# Patient Record
Sex: Male | Born: 1940 | Race: White | Hispanic: No | Marital: Married | Smoking: Former smoker
Health system: Southern US, Community
[De-identification: ages and names within clinical notes are randomized; demographics above are authoritative.]

## PROBLEM LIST (undated history)

## (undated) DIAGNOSIS — I739 Peripheral vascular disease, unspecified: Secondary | ICD-10-CM

## (undated) DIAGNOSIS — R0902 Hypoxemia: Secondary | ICD-10-CM

## (undated) DIAGNOSIS — I2510 Atherosclerotic heart disease of native coronary artery without angina pectoris: Secondary | ICD-10-CM

## (undated) DIAGNOSIS — I10 Essential (primary) hypertension: Secondary | ICD-10-CM

## (undated) DIAGNOSIS — I509 Heart failure, unspecified: Secondary | ICD-10-CM

## (undated) DIAGNOSIS — E785 Hyperlipidemia, unspecified: Secondary | ICD-10-CM

## (undated) DIAGNOSIS — J449 Chronic obstructive pulmonary disease, unspecified: Secondary | ICD-10-CM

## (undated) DIAGNOSIS — Z720 Tobacco use: Secondary | ICD-10-CM

## (undated) DIAGNOSIS — F1020 Alcohol dependence, uncomplicated: Secondary | ICD-10-CM

## (undated) HISTORY — DX: Hypoxemia: R09.02

## (undated) HISTORY — DX: Peripheral vascular disease, unspecified: I73.9

## (undated) HISTORY — DX: Heart failure, unspecified: I50.9

## (undated) HISTORY — DX: Hyperlipidemia, unspecified: E78.5

## (undated) HISTORY — DX: Atherosclerotic heart disease of native coronary artery without angina pectoris: I25.10

## (undated) HISTORY — DX: Tobacco use: Z72.0

## (undated) HISTORY — DX: Essential (primary) hypertension: I10

## (undated) HISTORY — PX: ANGIOPLASTY: SHX39

---

## 1999-11-11 ENCOUNTER — Observation Stay (HOSPITAL_COMMUNITY): Admission: RE | Admit: 1999-11-11 | Discharge: 1999-11-12 | Payer: Self-pay | Admitting: Cardiovascular Disease

## 2000-06-10 DIAGNOSIS — I2510 Atherosclerotic heart disease of native coronary artery without angina pectoris: Secondary | ICD-10-CM

## 2000-06-10 HISTORY — DX: Atherosclerotic heart disease of native coronary artery without angina pectoris: I25.10

## 2000-06-26 ENCOUNTER — Inpatient Hospital Stay (HOSPITAL_COMMUNITY): Admission: AD | Admit: 2000-06-26 | Discharge: 2000-06-28 | Payer: Self-pay | Admitting: Cardiovascular Disease

## 2000-06-26 ENCOUNTER — Encounter: Payer: Self-pay | Admitting: Cardiovascular Disease

## 2000-06-26 HISTORY — PX: CORONARY ANGIOPLASTY WITH STENT PLACEMENT: SHX49

## 2002-06-04 ENCOUNTER — Encounter: Payer: Self-pay | Admitting: Cardiovascular Disease

## 2002-06-04 ENCOUNTER — Ambulatory Visit (HOSPITAL_COMMUNITY): Admission: RE | Admit: 2002-06-04 | Discharge: 2002-06-05 | Payer: Self-pay | Admitting: Cardiovascular Disease

## 2002-07-02 ENCOUNTER — Ambulatory Visit (HOSPITAL_COMMUNITY): Admission: RE | Admit: 2002-07-02 | Discharge: 2002-07-03 | Payer: Self-pay | Admitting: Cardiovascular Disease

## 2004-06-01 ENCOUNTER — Ambulatory Visit (HOSPITAL_COMMUNITY): Admission: RE | Admit: 2004-06-01 | Discharge: 2004-06-01 | Payer: Self-pay | Admitting: Cardiovascular Disease

## 2004-07-08 ENCOUNTER — Encounter: Admission: RE | Admit: 2004-07-08 | Discharge: 2004-07-08 | Payer: Self-pay | Admitting: Neurology

## 2004-07-15 ENCOUNTER — Ambulatory Visit (HOSPITAL_COMMUNITY): Admission: RE | Admit: 2004-07-15 | Discharge: 2004-07-16 | Payer: Self-pay | Admitting: Cardiovascular Disease

## 2004-07-15 HISTORY — PX: SUBCLAVIAN ANGIOGRAM: SHX5350

## 2005-09-22 ENCOUNTER — Ambulatory Visit (HOSPITAL_COMMUNITY): Admission: RE | Admit: 2005-09-22 | Discharge: 2005-09-23 | Payer: Self-pay | Admitting: Cardiovascular Disease

## 2005-09-22 HISTORY — PX: RENAL ANGIOGRAM: SHX6061

## 2006-09-02 ENCOUNTER — Inpatient Hospital Stay (HOSPITAL_COMMUNITY): Admission: EM | Admit: 2006-09-02 | Discharge: 2006-09-04 | Payer: Self-pay | Admitting: Emergency Medicine

## 2006-09-02 ENCOUNTER — Ambulatory Visit: Payer: Self-pay | Admitting: Internal Medicine

## 2009-05-04 ENCOUNTER — Ambulatory Visit (HOSPITAL_COMMUNITY): Admission: RE | Admit: 2009-05-04 | Discharge: 2009-05-04 | Payer: Self-pay | Admitting: Cardiovascular Disease

## 2009-07-11 HISTORY — PX: NM MYOCAR PERF WALL MOTION: HXRAD629

## 2009-07-23 ENCOUNTER — Ambulatory Visit (HOSPITAL_COMMUNITY): Admission: RE | Admit: 2009-07-23 | Discharge: 2009-07-23 | Payer: Self-pay | Admitting: Cardiovascular Disease

## 2010-07-26 ENCOUNTER — Ambulatory Visit (HOSPITAL_COMMUNITY)
Admission: RE | Admit: 2010-07-26 | Discharge: 2010-07-26 | Payer: Self-pay | Source: Home / Self Care | Attending: Cardiovascular Disease | Admitting: Cardiovascular Disease

## 2010-11-26 NOTE — Discharge Summary (Signed)
   NAME:  Kevin Watkins, Kevin Watkins                             ACCOUNT NO.:  [id number]   MEDICAL RECORD NO.:  [id number]                   PATIENT TYPE:  OIB   LOCATION:  5523                                 FACILITY:  [facility]   PHYSICIAN:  Nanetta Batty, M.D.                DATE OF BIRTH:  Aug 31, 1940   DATE OF ADMISSION:  07/02/2002  DATE OF DISCHARGE:  07/03/2002                                 DISCHARGE SUMMARY   DISCHARGE DIAGNOSES:  1. Peripheral vascular disease, status post right innominate artery stenting     by Nanetta Batty, M.D.  2. History of coronary disease, status post right coronary artery stenting,     December 2001, in the setting of a DMI.  3. Peripheral vascular disease with previous left iliac artery stenting and     known right renal artery stenosis.  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  4. Treated hyperlipidemia.  5. History of smoking.   HOSPITAL COURSE:  The patient is a 70 year old male with coronary disease  and widespread peripheral vascular disease, was admitted for elective  angiogram.  This was done 07/02/02 by Nanetta Batty, M.D.  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  Allyson Sabal with good final results.  We feel he can be discharged 07/03/02.  He  will follow up with Dr. Allyson Sabal.   DISCHARGE MEDICATIONS:  1. Plavix 75 mg daily.  2. Aspirin 81 mg daily.  3. Metoprolol 50 mg b.i.d.  4. Altace 5 mg daily.  5. Lipitor 20 mg daily.  6. Folic acid daily.   LABORATORY DATA:  Not available at the time of dictation.   DISPOSITION:  The patient was discharged in stable condition and will follow  up with Nanetta Batty, M.D.  He needs upper extremity Dopplers and carotid  Dopplers after discharge.     Abelino Derrick, P.A.                      Nanetta Batty, M.D.    Lenard Lance  D:  10/03/2002  T:  10/04/2002  Job:  [id number]

## 2010-11-26 NOTE — Cardiovascular Report (Signed)
[facility]  Patient:    Kevin Watkins, Kevin Watkins                          MRN: [id number] Adm. Date:  [id number] Attending:  Berry, Jonathan [place] Dictator:   [phone] CC:         [facility]             [facility]             Roney Marion, M.D., [facility]                        Cardiac Catheterization  PROCEDURE:  Peripheral angiogram/PTA and stent.  HISTORY OF PRESENT ILLNESS:  Kevin Watkins is a 70 year old married white male, father of one child who works for the [employer] company. He is referred for asymmetric upper extremity blood pressures with an auscultated bruit suggesting the possibility of subclavian disease. His risk factors include greater than 100 pack year history of tobacco, hypertension. He has had a negative cardiac stress test, normal left ventricular function. I performed angiography on him at [facility] revealing a high grade left common iliac which I stented. I also discovered a high-grade innominate and subclavian stenosis. The patient presents here for endovascular treatment of these lesions.  DESCRIPTION OF PROCEDURE:  The patient was brought to the second floor [facility] in the post absorptive state. He was premedicated with p.o. Valium. His right groin was prepped and shaved in the usual sterile fashion. One percent Xylocaine was used for local anesthesia. An 8 French short sheath was inserted into the right femoral artery using standard Seldinger technique. A 6 French pigtail catheter was used for aortic arch angiography. A 6 French right Judkins was used for selective innominate and left subclavian angiography. Visipaque dye was used for the entirety of the case. Retrograde aortic pressure was monitored during the case.  ANGIOGRAPHIC RESULTS: 1. Arch angiogram. All accesses were intact. There appeared to be    a high grade right innominate and subclavian  lesion with mild    narrowing of the proximal left femoral and carotid. 2. Selective left subclavian; This revealed mild narrowing in the    20% range of the proximal left subclavian artery. There was a    70% ostial left vertebral artery stenosis. 3. Selective right innominate angiography. A complex proximal 90%    right innominate stenosis and 60% proximal right subclavian    stenosis.  DESCRIPTION OF PROCEDURE: Using a 6 French right Judkins catheter and an O35 angled Glidewire, the lesion was crossed and the wire was past into the distal right subclavian artery. A right catheter was then advanced over the Glidewire, across the right innominate lesion, into the right subclavian and a long Amplatz Super Stiff guidewire was then used to exchange. An 8 French 90 cm long Cordis sheath was then advanced over the the Amplatz Super Stiff guidewire into the proximal right innominate. The patient received 3000 units of heparin intravenously. Dr. Sharyn Blitz assisted during the case.  Predilatation was performed with a 5 x 2 Powerflex abdominal pressures. Following this, a ____ mounted on an 8 x 2 Powerflex was then deployed and post dilated with a 9 x 2 Powerflex at 6 atmospheres for 30 seconds resulting in reduction of a 90% proximal right innominate stenosis to 0% residual. There was a 10 to 15  mm gradient noted across the right subclavian artery stenosis. It had been decided to proceed with stenting this; however, the guide wire slipped back into the innominate and was unable to be advanced and because of this the procedure was terminated.  OVERALL IMPRESSION:  Successful right innominate PTA and stenting for relief of symptoms of right upper extremity claudication. I believe this is the most hemodynamically significant lesion and that he will enjoy considerable clinical benefits. He will also be treated with aspirin and Plavix. The sheaths removed after an ACT measured and pressure was  held on the groin to achieve hemostasis. The patient left the lab in stable condition. He will be discharged home in the morning with follow-up upper extremity Dopplers and return office visit subsequent to that. Dr. Lawana Pai was notified of these results. DD:  11/11/99 TD:  11/11/99 Job: [id number]

## 2010-11-26 NOTE — Discharge Summary (Signed)
[facility]  Patient:    OTHMAN, MASUR                          MRN: [id number] Adm. Date:  [id number] Disc. Date: [id number] Attending:  Berry, Jonathan [place] Dictator:   Halford Decamp. Delanna Ahmadi, R.N., N.P. CC:         Dr. Fayrene Fearing _________   Discharge Summary  HISTORY OF PRESENT ILLNESS/HOSPITAL COURSE:  Mr. Melbert Botelho was transferred from [facility] with chest pain acutely.  He had ST elevation in III and aVF. He also had PVCs and a short run of ventricular tachycardia treated with lidocaine.  He was transferred here for cardiac catheterization.  He was seen by Gerlene Burdock A. Alanda Amass, M.D., on arrival.  He had been started on aspirin, IV heparin and IV Aggrastat and given nitroglycerin at [facility].  His ST elevation was resolved.  EKG here showed no acute pattern; however, he was taken to the catheterization lab which revealed a 60% RCA proximal and a 60 to 70% distal.  He underwent PTCA and stenting of the distal lesion, given Aggrastat for 24 hours.  Post procedure he did well without any further complications of chest pain.  His cardiac enzymes were elevated.  He was seen by cardiac rehab and education was given to him and recommendations for cardiac rehab phase II program were given.  His medications were changed.  LABS:  TSH 1.16.  Total cholesterol 179, triglycerides 152, HDL 35, and LDL of 114, total cholesterol/HDL ratio 5.1. Hemoglobin 14, hematocrit 40.4, platelets 195, MCV 94.2. Sodium 130, potassium 3.8, BUN 7, creatinine 0.8, magnesium 1.5.  CK-MB #1 was 223/33.2 with troponin I of 5.40, #2 was 213/24.5 with troponin I of 4.7, #3 was 123/11.6.  BMET on June 27, 2000, showed sodium 130, potassium 3.8, glucose 105, BUN 7, creatinine 0.8.  A chest x-ray showed no active lung disease.  EKG showed normal sinus rhythm with T-wave inversions in lead III.  DISCHARGE MEDICATIONS:  1. Enteric coated aspirin 325 mg once a day.  2. Plavix 75 mg  once per day x 4 weeks.  3. Protonix 40 mg once per day x 4 weeks.  4. Lopressor 25 mg twice per day.  5. Altace 2.5 mg once per day.  6. Wellbutrin SR 150 mg twice per day.  7. Nitroglycerin 1/150 under tongue every five minutes x 3 for chest pain     when needed.  8. Lipitor 10 mg once per day.  9. Folic acid 1 mg once per day. 10. Vitamin B50  complex one everyday. 11. Vitamin E 400 i.u. once per day.  ACTIVITY:  He should do no strenuous activity, no driving until this weekend and then only short distance.  He should play no golf.  Do not go back to work until he is seen by Runell Gess, M.D., as an outpatient and he is released.  DISCHARGE INSTRUCTIONS:  If he has any problems with his groin he is to give our office a call.  We have recommended that he quit smoking and that he attend cardiac rehab phase II and we are recommending that he have a BMET drawn next Wednesday after initiation of Altace.  FOLLOW-UP:  He will follow up with Dr. Allyson Sabal on July 19, 2000, at 3:10.  DISCHARGE DIAGNOSES: 1. Acute inferior myocardial infarction. 2. Status post emergent intervention with 60 to 70% distal right coronary    artery,  60% proximal right coronary artery and a 50 to 60% nondominant    circumflex mid lesion with subsequent percutaneous transluminal coronary    angioplasty and stenting of his distal right coronary artery. 3. Arteriosclerotic peripheral vascular disease.  Per catheterization this    admission, he has a 60 to 70% stenosis of his innominate artery    stent. 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  He has a 60%    right renal artery stenosis.  He has a prior medical history of left    common iliac artery percutaneous transluminal angioplasty and stenting on    October 21, 1999, and Nov 11, 1999, he had a right innominate percutaneous    transluminal angioplasty and stenting. 4. Hyperlipidemia. 5. Hypertension. 6. Continued tobacco use. 7. Chronic  obstructive pulmonary disease. 8. Anxiety. DD:  06/28/00 TD:  06/29/00 Job: [id number]

## 2010-11-26 NOTE — H&P (Signed)
NAME:  Kevin Watkins, Kevin Watkins                             ACCOUNT NO.:  [id number]   MEDICAL RECORD NO.:  [id number]                   PATIENT TYPE:  OIB   LOCATION:  5523                                 FACILITY:  [facility]   PHYSICIAN:  Nanetta Batty, M.D.                DATE OF BIRTH:  [birth year]   DATE OF ADMISSION:  07/02/2002  DATE OF DISCHARGE:  07/03/2002                                HISTORY & PHYSICAL   CHIEF COMPLAINT:  Arm weakness.   HISTORY OF PRESENT ILLNESS:  The patient is a 70 year old male followed by  Dr. Allyson Sabal with history of coronary disease and peripheral vascular disease.  Please see attached office notes for complete details.  He has right upper  extremity claudication and is admitted for elective angiogram.  On recent  angiogram while doing Allyson Sabal was doing his right common iliac, it was noted  that he had a 99% in-stent restenosis of his right innominate artery.   PAST MEDICAL HISTORY:  This is remarkable for hyperlipidemia and coronary  disease with previous RCA stenting and DMI.  He has had previous iliac  artery stenting also.   CURRENT MEDICATIONS:  1. Plavix daily.  2. Aspirin 81 mg a day.  3. Metoprolol 50 mg twice a day.  4. Altace 5 mg a day.  5. Lipitor 20 mg a day.  6. Folic acid daily.   ALLERGIES:  No known drug allergies.   SOCIAL HISTORY:  He is married.  He is a smoker but has quit.   FAMILY HISTORY:  Unremarkable.   Please refer to old records for complete details.   REVIEW OF SYSTEMS:  Essentially unremarkable except for as noted above. He  does wear glasses.   PHYSICAL EXAMINATION:  GENERAL APPEARANCE:  He is a well-developed, well-  nourished male in no acute distress.  VITAL SIGNS:  Blood pressure 120/66, pulse 68, temperature 97.2.  HEENT:  Normocephalic.  Extraocular movements intact.  Sclerae is  nonicteric.  NECK:  Carotid bruits.  No JVD.  CHEST:  Clear to auscultation and percussion.  CARDIOVASCULAR:  Regular rate and rhythm  without obvious murmurs, rubs, or  gallops.  Normal S1 and S2.  ABDOMEN:  Nontender, nondistended.  EXTREMITIES:  Diminished distal pulses with normal artery bruits.  NEUROLOGIC:  Grossly intact.  He is awake, alert and oriented.  He is  cooperative and moves all extremities without obvious deficit.    IMPRESSION:  1. 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  2. Previous peripheral interventions as described.  3. Coronary disease with previous right coronary artery stenting in the     setting of diaphragmatic myocardial infarction.  4. Treated hyperlipidemia.  5. History of smoking.   PLAN:  The patient is admitted for peripheral angiogram and  further  intervention by Dr. Allyson Sabal.     Abelino Derrick, P.A.                      Nanetta Batty, M.D.    Kevin Watkins  D:  10/03/2002  T:  10/04/2002  Job:  [id number]

## 2010-11-26 NOTE — Discharge Summary (Signed)
   NAME:  Kevin Watkins, Kevin Watkins                             ACCOUNT NO.:  [id number]   MEDICAL RECORD NO.:  [id number]                   PATIENT TYPE:  OIB   LOCATION:  4736                                 FACILITY:  [facility]   PHYSICIAN:  Nanetta Batty, M.D.                DATE OF BIRTH:  Jul 31, 1940   DATE OF ADMISSION:  06/04/2002  DATE OF DISCHARGE:  06/05/2002                                 DISCHARGE SUMMARY   DISCHARGE DIAGNOSES:  1. Peripheral vascular disease, status post right iliac PTA this admission.  2. 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  3. History of smoking.  4. History of hyperlipidemia.  5. History of coronary disease with previous right coronary artery PCI and     stenting 12/01.   HOSPITAL COURSE:  Mr. Wien is a 70 year old male followed by Dr. Allyson Sabal with a  history of coronary disease and peripheral disease. Dr. Allyson Sabal did a RCA PCI  and stenting 06/26/00. He had normal LV function. He had left iliac stenting  in the past and a previous high grade innominate stenosis that was stented  11/11/99. He has some dizziness seen by Dr. Allyson Sabal 05/31/02. He is admitted for  angiogram as Dr. Allyson Sabal was concerned he might have subclavian steel. The  patient was admitted for peripheral angiogram. This was done 06/04/02. He  had a 60% right renal artery stenosis and 95% right iliac stenosis and a 99%  in-stent restenosis of the right innominate artery. He underwent PTA of the  right iliac and is to be set up for a staged intervention of the right  innominate. He is discharged 06/05/02.   DISCHARGE MEDICATIONS:  1. Plavix 75 mg a day.  2. Baby aspirin q.d.  3. Metoprolol 25 mg b.i.d.  4. Lipitor 20 mg q.d.  5. Altace 5 mg q.d.  6. Nicotine patch.   LABORATORY DATA:  White count 7.3, hemoglobin 14.4, hematocrit 41.3,  platelets 149. Sodium 133, potassium 4.0, BUN 7, creatinine 0.9.   DISPOSITION:  The patient  is discharged in stable condition and will follow  up with Dr. Allyson Sabal on 12/5 at 4:25 p.m. He will need to be set up for a  staged intervention of the right innominate artery at that time.     Abelino Derrick, P.A.                      Nanetta Batty, M.D.    Lenard Lance  D:  06/05/2002  T:  06/06/2002  Job:  [id number]

## 2010-11-26 NOTE — Cardiovascular Report (Signed)
NAME:  Kevin Watkins, COOPRIDER                   ACCOUNT NO.:  [id number]   MEDICAL RECORD NO.:  [id number]          PATIENT TYPE:  OIB   LOCATION:  2899                         FACILITY:  [facility]   PHYSICIAN:  Nanetta Batty, M.D.   DATE OF BIRTH:  03-25-1941   DATE OF PROCEDURE:  06/01/2004  DATE OF DISCHARGE:                              CARDIAC CATHETERIZATION   INDICATIONS:  Mr. Mccambridge is a 70 year old moderately overweight white male with  history of coronary artery disease and PVOD.  He has had RCA PCI and  stenting December 2001 with noncritical LAD and circumflex disease.  He has  had innominate PTA and stenting and 2001 with restenting in December 2003.  He has known 50% renal artery stenosis and he has had right common iliac  artery PTA and stenting.  Continues to smoke and has history of hypertension  and hyperlipidemia.  He has complained of posterior circulation symptoms and  his Dopplers suggest restenosis of his innominate.  Presents now for  angiography and potential intervention.   PROCEDURE DESCRIPTION:  The patient was brought to the sixth floor [facility] in the postabsorptive state.  He  was premedicated with p.o. Valium and IV Versed.  His right groin was  prepped and shaved in the usual sterile fashion.  1% Xylocaine was used for  local anesthesia.  A 5 French sheath was inserted into the right femoral  artery using standard Seldinger technique. A 5 [place] tennis racquet  catheter, long right Judkins, JB1, Bernstein 2 and VTach  catheters were  used for arch angiography, selective right innominate, left common carotid,  left vertebral, distal abdominal and right renal angiography.  Visipaque dye  was used for the entirety of the case.  Retrograde aortic pressure was  monitored during the case.   ANGIOGRAPHIC RESULTS:  1.  Arch aortogram.      1.  Right innominate totally occluded with faint filling distal to the          stent and  obvious retrograde fill of the vertebral artery.      2.  Left common carotid; 90% aortoostial with 80 mm gradient.      3.  Left subclavian; widely patent with 80% left vertebral and          retrograde fill of the right vertebral with delayed imaging.  2.  Distal aortogram.      1.  60% right renal artery stenosis with 40 mm gradient.      2.  Widely patent right common iliac artery stent.   PROCEDURE DESCRIPTION:  The patient received 2500 units of heparin  intravenously.  Attempts were made to cross the right innominate stent using  Wholey and glide wire unsuccessfully.  The procedure was aborted.  The ACT  was measured less than 200 and the sheath was removed.  Pressure was held on  the groin to achieve hemostasis.  The patient left the lab in stable  condition.  Plans will be to keep him recumbent for six hours and hydrate  him.  He  will be discharged home today as an outpatient and we will see him  back in the office in one week.  We will plan on bringing him to approach  his right innominate through the right brachial approach.  He also has a  high grade right common carotid artery which suspect will require  percutaneous revascularization.  I have discussed this with Dr.  Madilyn Fireman who agrees that the patient should be referred to Dr. Pearlean Brownie for  neurologic evaluation and then to him for surgical evaluation.  He is a not  a surgical candidate because of the anatomic location and he is symptomatic.  The patient left the lab in stable condition.       JB/MEDQ  D:  06/01/2004  T:  06/01/2004  Job:  [id number]   cc:   Dr. Roney Marion

## 2010-11-26 NOTE — Cardiovascular Report (Signed)
NAME:  Kevin Watkins, Kevin Watkins                   ACCOUNT NO.:  [id number]   MEDICAL RECORD NO.:  [id number]          PATIENT TYPE:  OIB   LOCATION:  2807                         FACILITY:  [facility]   PHYSICIAN:  Nanetta Batty, M.D.   DATE OF BIRTH:  January 05, 1941   DATE OF PROCEDURE:  09/22/2005  DATE OF DISCHARGE:                              CARDIAC CATHETERIZATION   PROCEDURE:  Arch aortogram, abdominal aortogram, selective right innominate  subclavian, left common carotid, and left subclavian as well as right renal  and left renal artery angiogram, PT and stent of right renal artery.   HISTORY:  Mr. Kevin Watkins is a 70 year old gentleman with a history of CAD and PVOD.  He has a history of continued tobacco use, hypertension, and hyperlipidemia.  He has had a right innominate and right subclavian artery PT and stenting in  the past via the right brachial approach in January 2006.  He has known left  common carotid artery ostial disease which we have been following  clinically.  He is being seen by Dr. Delia Heady for this.  He had a 60%  right renal artery stenosis which we had been following by Duplex ultrasound  and recently his velocities have accelerated.  He does have hypertension,  poorly controlled.  He also has a history of CAD status post cath December  2001 revealing a patent RCA stent with a non-critical circumflex disease.  He has had a low risk Cardiolite July 21, 2005, and denies chest pain or  shortness of breath.  His creatinine was normal.  He presents now for  angiography and potential intervention.   DESCRIPTION OF PROCEDURE:  The patient was brought to the first floor  [facility]  [facility] in the postabsorptive state.  He was premedicated with p.o.  Valium.  His right groin was prepped and shaved in the usual sterile  fashion.  1% Xylocaine was used for local anesthesia.  A 6 French sheath was  inserted into the left  femoral artery using standard Seldinger technique.  A  6 French pigtail catheter, long right Judkins catheter, H1 catheter, CB1  catheter, and short IMA catheters were used for distal abdominal  aortography, selective right innominate, subclavian, left common carotid,  left subclavian, and bilateral renal artery angiography.  Visipaque dye was  used for the entirety of the case.  Retrograde aortic pressures were  monitored during the case.   ANGIOGRAPHIC RESULTS:  1.  Arch aortogram.      1.  Arch vessels were intact.  The right innominate had a patent stent.      2.  The right subclavian artery stent was 100% occluded.  The right          subclavian filled by retrograde filling via ipsilateral collaterals          from the right vertebral.      3.  Left common carotid artery 90% ostial stenosis.      4.  Left subclavian artery widely patent with a 75% ostial left  vertebral artery stenosis.  2.  Abdominal aortogram.      1.  90% proximal right renal artery stenosis.      2.  Normal left renal artery.      3.  Normal infrarenal abdominal aorta.   PROCEDURE DESCRIPTION:  The patient received 2000 units of heparin  intravenously.  Using a 6 French short IMA guide catheter along with a 190  stabilizer wire and a 4 by 2 Aviator, PTA was performed of the right renal  artery.  Following this, a 5 by 12 Genesis on Aviator stent balloon pre-  mount was then deployed at 8 atmospheres resulting in reduction of a  proximal 90% right renal artery stenosis to 0% residual.  There was post  stenotic dilatation versus small aneurysmal dilatation which the stent  stopped  just short of.   IMPRESSION:  Successful right renal artery PTA and stenting for hypertension  and renal preservation.  The patient does have an occluded right subclavian  artery stent which we will treat medically at this time.  We will have Dr.  Madilyn Fireman review the arch aortogram and selective left common carotid angiogram   to decide whether or not this should be approached percutaneously.   The ACT was measured and the sheath was removed, pressure was held on the  groin to achieve hemostasis.  The patient left the lab in stable condition.  He will be hydrated overnight and discharged home in the morning.  We will  get follow up renal Doppler studies after which I will see him back in the  office for follow up.      Nanetta Batty, M.D.  Electronically Signed     JB/MEDQ  D:  09/22/2005  T:  09/23/2005  Job:  [id number]   cc:   Roney Marion, M.D.

## 2010-11-26 NOTE — Cardiovascular Report (Signed)
NAME:  Kevin Watkins, Kevin Watkins                             ACCOUNT NO.:  [id number]   MEDICAL RECORD NO.:  [id number]                   PATIENT TYPE:  OIB   LOCATION:  2889                                 FACILITY:  [facility]   PHYSICIAN:  Nanetta Batty, M.D.                DATE OF BIRTH:  07-16-1940   DATE OF PROCEDURE:  DATE OF DISCHARGE:                              CARDIAC CATHETERIZATION   PROCEDURE:  Percutaneous transluminal coronary angioplasty and stent.   INDICATIONS:  The patient is a 70 year old white male with a history of CAD  and PVOD. He has had RCA, PCI and stenting on June 26, 2000, in the  setting  of an aborted inferior wall myocardial infarction. He has had left  iliac stenting in the past, 50% right renal artery stenosis. 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 He does complain of right lower extremity  claudication as well as right upper extremity claudication. His other  problems include hypertension, hyperlipidemia and continued tobacco abuse.  Upper extremity Dopplers performed on May 28, 2000, suggested low  frequency signal  in the right common carotid and right subclavian,  monophasic wave forms, and a 50 mm gradient across his arms. He presents now  for angiography and potential intervention.   DESCRIPTION OF PROCEDURE:  The patient was brought  to the sixth floor [facility] peripheral vascular angiographic suite in a postabsorptive  state. He was premedicated with p.o. Valium. Both groins were prepped and  shaved in the usual sterile fashion. Then 1% Xylocaine was used for local  anesthesia.   A 5 French sheath was inserted into the left femoral artery using standard  Seldinger technique. A 5 French long pigtail catheter was used for RT  angiography and abdominal aortography. A 5 French JB1 catheter was used for  selective right innominate angiography. Omnipaque  was used for the entirety  of the case. The aortic pressure was monitored during the case.   ANGIOGRAPHIC RESULTS:  1. 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  2. Right subclavian:  60% proximal stenosis. The right vertebral with a good     ostium.  3. Left common carotid:  60% to 70% ostial stenosis.  4. Left subclavian artery:  Widely patent.  5. Abdominal aorta.     A. Renal arteries, 60% right renal artery stenosis.     B. Infrarenal abdominal aorta was OK.  6. Left lower extremities.     A. Left common iliac artery stent was widely patent.  7. Right lower extremity.     A. 95% ostial/proximal right common iliac artery stenosis.   DESCRIPTION OF PROCEDURE:  The patient received 3000 units of heparin  intravenously. Access to the right common femoral artery was obtained with a  smart needle and a 5 Wholey  wire. A 7 French 30 cm long bright tipped Cordis  sheath was then inserted in the right common femoral artery and advanced to  the right common  iliac artery. A PTA was performed with a 5 x 4 power flex,  resulting in a suboptimal angiographic result.   Following this  a 7 x 29 mm Genesis _________ balloon stent premount  combination was then deployed under a precise angiographic control at 12  atmospheres for 60 seconds. The final result was reduction of the 95%  proximal right common iliac artery stenosis to 0% residual. The patient  tolerated the procedure well.   The indices measured and both sheaths were removed. Pressure was held on  each groin to achieve hemostasis. The patient left the laboratory in stable  condition.  Plans will be to hydrate the patient overnight and discharge to  home in the morning. He will be seen back in the  office in one to two  weeks. At that time we will schedule his right innominate PPA. He left the  laboratory in stable condition. Dr. Roney Marion was notified of these  results.                                               Nanetta Batty, M.D.    JB/MEDQ  D:  06/04/2002  T:  06/05/2002  Job:  [id number]   cc:   [facility]   Roney Marion, M.D.  [facility]  [place], [place]

## 2010-11-26 NOTE — Cardiovascular Report (Signed)
NAME:  Kevin Watkins, Kevin Watkins                   ACCOUNT NO.:  [id number]   MEDICAL RECORD NO.:  [id number]          PATIENT TYPE:  OIB   LOCATION:  2853                         FACILITY:  [facility]   PHYSICIAN:  Nanetta Batty, M.D.   DATE OF BIRTH:  06/11/1941   DATE OF PROCEDURE:  07/15/2004  DATE OF DISCHARGE:                              CARDIAC CATHETERIZATION   INDICATIONS FOR PROCEDURE:  The patient is a 70 year old white male with  history of CAD and PVOD. He has had RCA PCI and stenting in December of  2001, noncritical LAD and circumflex disease with normal LV function.  He  has had innominate stenting in May of 2001 with restenting in December of  2003.  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  He has had bilateral  iliac artery PTA and stenting with a known 50% renal artery stenosis which  was recently assessed with a 40 mm gradient by catheterization.  His other  problems include hypertension and hyperlipidemia.  He does continue to  smoke.  He has had postcirculation symptoms.  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 He  was seen by Pramod P. Pearlean Brownie, M.D. for neurologic evaluation who agreed with  attempts at percutaneous revascularization of his right innominate after  failed attempt to access this in a retrograde fashion.  He presents now for  attempted intervention via the right brachial approach.   DESCRIPTION OF PROCEDURE:  The patient was brought to the sixth floor [facility] Peripheral Vascular Angiographic Suite in the postabsorptive  state.  He was premedicated with p.o. Valium, IV Versed, and Nubain. His  right antecubital fossa was prepped and shaved in the usual sterile fashion.  1% Xylocaine was used for local anesthesia.  A 6 French sheath was inserted  into the right brachial artery using a Doppler-tip needle and an 0.35  Wooley  wire.  Visipaque dye was used for the entirety of the case.  Retrograde  aortic pressures were monitored during the case.  The patient received 5000  units of heparin via the sidearm sheath intra-arterially as well as 200 mcg  of intra-arterial nitroglycerin to prevent vasospasm.  Retrograde pressure  was monitored.   A short 5 French right Judkins catheter was advanced over the [place] wire to  the innominate artery and attempts were made to cross the lesion with the  [facility] wire unsuccessfully.  Following this, the right Judkins was exchanged  for a 5 French short end-hole catheter and the [facility] for an 0.35 angled  Glidewire which then after several minutes of angulation manipulation, was  able to pass across the stenosis into the ascending aorta.  The catheter was  then advanced across the lesion and an 0.35 long Rosen wire was then passed  into the descending aorta across the arch.  PTA was performed using a 5.2  Powerflex upgrading to a 6.2 Powerflex under fluoroscopic control across the  occluded innominate stent.  Following this, a  30 cm long multipurpose Bright  Tip Cordis 30 cm long sheath was then exchanged and advanced to the  innominate artery.  An 8 x 24 Genesis on Opta premount was then deployed at  10 and 12 atmospheres resulting in reduction of a total occlusion to 0%  residual with excellent flow.  The sheath was then withdrawn across the  subclavian documenting a 30 mm gradient.  In addition, angiography revealed  a subtotally occluded right vertebral with a string sign.  A 7 x 18  Genesis on Opta premount was then deployed across the proximal subclavian,  reducing a 70% lesion to 0% residual with excellent flow. The patient  tolerated the procedure well.  An ACT was measured and the sheath was  removed.  Pressure was held in the antecubital fossa to achieve hemostasis.  The patient left the lab in stable condition.   PLAN:  Hydrate him.  Treat him with aspirin  and Plavix.  Discharge him home  in the morning.  He will get follow-up upper extremity Dopplers.  I will  then talk to Dr. Pearlean Brownie regarding PTA and stenting of the origin of his left  common carotid artery plus or minus his left vertebral artery.      JB/MEDQ  D:  07/15/2004  T:  07/15/2004  Job:  [id number]   cc:   [facility]  [address] [place] [address]   Fidela Salisbury, M.D.

## 2010-11-26 NOTE — Cardiovascular Report (Signed)
[place]  Patient:    Kevin Watkins, Kevin Watkins                          MRN: [id number] Proc. Date: 06/26/00 Adm. Date:  [id number] Attending:  Berry, Jonathan Watkins CC:         Cardiac Catheterization Laboratory  Kevin Watkins, M.D., [facility], [place]  [facility], [place] [address] [place], [place] [address]   Cardiac Catheterization  INDICATIONS:  Kevin Watkins is a 70 year old married, white male, patient of Kevin Watkins and Kevin Watkins.  He is status post right innominate and left common iliac artery PTCA and stenting.  His risks factors include greater than 100-pack-year history of tobacco abuse and hypertension.  He had a Cardiolite performed in March of this year showing relatively normal perfusion with some inferobasilar thinning, felt not to be significant.  He developed chest pain last night and into this morning, at [facility] where he had ST segment elevation and was treated with nitroglycerin which caused resolution of his ECG changes.  He was treated with IV heparin, nitroglycerin, Aggrastat, aspirin, and transported to [facility] for cardiac catheterization.  DESCRIPTION OF PROCEDURE:  The patient was brought to the second floor [facility] Laboratory in the postabsorptive state.  He was premedicated with p.o. Valium.  His right groin was prepped and shaved in the usual sterile fashion.  Xylocaine 1% was used for local anesthesia.  A 7 French sheath was inserted into the right femoral artery using standard Seldinger technique.  A 6 French sheath was inserted into the right femoral vein.  A 6 French right and left Judkins diagnostic catheter, along with a 6 French pigtail catheter were used for selective coronary angiography, left ventriculography, selective right innominate and left subclavian angiography, supravalvar aortography, and distal abdominal aortography.  Hexabrix dye was used  for the entirety of the case.  Retrograde, aortic, left ventricular, and pullback pressures were recorded.  HEMODYNAMICS: 1. Aortic systolic pressure 148, diastolic pressure 74. 2. Left ventricular systolic pressure 140, diastolic pressure 17.  SELECTIVE CORONARY ANGIOGRAPHY: 1. Left main:  Normal. 2. Left anterior descending:  The LAD is normal. 3. Left circumflex:  This gave off a small first and a moderate sized second    marginal branch which had a 50-60% proximal stenosis. 4. Right coronary artery:  This is a large dominant vessel with 60% segmental    mid stenosis followed by 60-70% hypodense fairly focal stenosis at the    crux of the RCA.  LEFT VENTRICULOGRAPHY:  The RAO left ventriculogram was performed using 20 cc of Hexabrix dye at 10 cc/sec.  The overall LVEF is estimated at greater than 60% without focal wall motion abnormalities.  RIGHT INNOMINATE ANGIOGRAPHY:  This revealed a widely patent right innominate stent.  However, there was 60-70% aorto-ostial stenosis as well as an 80% eccentric proximal right subclavian artery stenosis.  The left subclavian and IMA were visualized and were widely patent.  Next, supravalvular aortogram performed in the LAO cranial view using 20 cc of Hexabrix dye at 20 cc/sec. revealed the aorto-ostial location of the right innominate stenosis.  DISTAL ABDOMINAL AORTOGRAPHY:  Distal abdominal aortogram was performed using 20 cc of Hexabrix dye at 20 cc/sec.  The renal arteries revealed approximately a 50% proximal right renal artery stenosis.  The left common iliac stent was widely patent.  IMPRESSION:  Kevin Watkins had an aborted  inferior wall myocardial infarction secondary to what appears to be a ruptured plaque in the distal right coronary artery.  This is 60-70% and hypodense in the large/dominant vessel.  PLAN:  We will proceed with PCI using adjunctive glycoprotein IIb/IIIa inhibitor (Aggrastat).  DESCRIPTION OF PROCEDURE:  The  patient received 2500 units of heparin with an ACT documented at approximately 274.  Using a 7 [place] guide catheter with side holes along with a 0.14/190 cm long, length sport guidewire, a 2.5 x 15 mm long Maverick balloon, PTCA was performed, ______ ______ Following this, a 3.5 x 12 mm long, S7 stent was precisely positioned across the stenosis just proximal to the PDA, PLA bifurcation and deployed at 12 atmospheres resulting in reduction of 60-70% hypodense distal RCA stenosis to 0% residual without dissection.  The patient received 200 mcg of intracoronary nitroglycerin.  He tolerated the procedure well.  OVERALL IMPRESSION:  Successful percutaneous transluminal coronary angioplasty and stenting of a distal right coronary artery lesion in the setting of a recently aborted diaphragmatic myocardial infarction.  The heparin was discontinued.  The sheaths were sewn securely in place.  The guide catheter and wire were removed.  The patient left the lab in stable condition on Aggrastat IV, which will continue for 24 hours.  The patient will be discharged home Wednesday morning with close outpatient office followup.  He left the lab in stable condition.  Kevin Watkins was notified of these results. DD:  06/26/00 TD:  06/27/00 Job: [id number]

## 2010-11-26 NOTE — Cardiovascular Report (Signed)
NAME:  Kevin Watkins, Kevin Watkins                             ACCOUNT NO.:  [id number]   MEDICAL RECORD NO.:  [id number]                   PATIENT TYPE:  OIB   LOCATION:  5523                                 FACILITY:  [facility]   PHYSICIAN:  Nanetta Batty, M.D.                DATE OF BIRTH:  [birth year]   DATE OF PROCEDURE:  07/02/2002  DATE OF DISCHARGE:                              CARDIAC CATHETERIZATION   INDICATION:  The patient is a 70 year old gentleman, with a history of CAD  and PVOD.  He had a RCA PCI and stenting June 26, 2000, in the setting  of a aborted inferior myocardial infarction.  He has had left iliac artery  PTA and stenting and known right renal artery stenosis.  He has had high-  grade innominate disease, which is stented on Nov 11, 1999, with a 60% right  subclavian artery stenosis.  I recently performed PTCA and stenting of his  right common iliac artery and documented 99% in-stent re-stenosis within  his right innominate stent. He has low flow in his right common carotid and  right upper extremity claudication.  He presents now for percutaneous  intervention.   DESCRIPTION OF PROCEDURE:  The patient was brought to the sixth floor [facility] Peripheral Vascular Angiographic Suite in the postabsorptive state.  He  was premedicated with p.o. Valium.  His right groin was prepped and shaved  in the usual sterile fashion.  Xylocaine 1% was used for local anesthesia.  A 5 upgraded to a 7 [place] sheath was inserted into the right femoral artery  using standard Seldinger technique. A 5 French right  Judkins catheter was  used for selective angiography of the right innominate and subclavian  artery.  Omnipaque dye was used for the entirety of the case.  Retrograde  and aortic pressures were monitored during the case.  The patient received  5300 units of heparin intravenously prior to angiography.   Attempts were initially made to wire the innominate stent using a Wholey  wire.   However, the obstruction was too tight and would not allow passage of  this wire.  Because of this, a 7 [place] JR4 guide passed along with an  0.014, 300 Sport guide wire was used (coronary technology).  This easily  passed the stenosis and was guided into the right subclavian artery.  A 5 x  5 x 2 cm long, Maverick balloon was then used to pre-dilate the right  innominate stent.  The 0.014 wire was then exchanged for an 0.035 angled  Glidewire and PTA was performed with a 8 x 2 PowerFlex resulting in a  suboptimal angiographic result.  Following this, a Genesis 184 stent was  then hand crimped onto a new 8 x 2 PowerFlex and deployed at the ostium of  the right innominate artery within the previously placed stent (a sandwich  fashion) and deployed  at 12 atmospheres.  The final angiographic result was  reduction of a 99% in-stent ostial re-stenosis with less than 20%  residual.  There was at most 20 mm gradient noted after the procedure.  The  patient tolerated the procedure well.   OVERALL IMPRESSION:  Successful percutaneous transluminal coronary  angioplasty and stenting of right innominate in-stent re-stenosis with  right upper extremity claudication.   An ACT was measured and the sheaths were removed.  Pressure was held on the  groin to achieve hemostasis.  The patient left the lab in stable condition.  He will be treated with aspirin and Plavix, discharge home in the morning.  He will obtain carotid and upper extremity Dopplers in our office after  which he will see me back in the office for followup in approximately two  weeks.  Dr. Roney Marion was notified of these results.                                                     Nanetta Batty, M.D.    Cordelia Pen  D:  07/02/2002  T:  07/03/2002  Job:  [id number]   cc:   Peripheral Vascular Angiographic Ste.   Roney Marion, M.D.

## 2010-11-26 NOTE — Discharge Summary (Signed)
NAME:  Kevin Watkins, Kevin Watkins                   ACCOUNT NO.:  [id number]   MEDICAL RECORD NO.:  [id number]          PATIENT TYPE:  INP   LOCATION:  3704                         FACILITY:  [facility]   PHYSICIAN:  Kevin Watkins, M.D.     DATE OF BIRTH:  12-Dec-1940   DATE OF ADMISSION:  09/02/2006  DATE OF DISCHARGE:  09/04/2006                               DISCHARGE SUMMARY   DISCHARGE DIAGNOSES:  1. Bilateral pneumonia:  Community-acquired versus influenza versus      atypical.  2. Chronic obstructive pulmonary disease exacerbation.  3. Coronary artery disease, status post inferior myocardial infarction      in December of 2001 with stenting to the right coronary artery.  4. Chronic tobacco abuse, with greater than 100-pack-year history.  5. Extensive peripheral vascular disease, with multiple stents and in-      stent restenosis.  Please see eChart dictations for further      details.  6. Hyperlipidemia.  7. Hypertension.  8. Thrombocytopenia.  9. Hyperglycemia.  10.Hypernatremia.  11.Contact dermatitis over the lower abdomen, questionable nickel      allergy.   DISCHARGE MEDICATIONS:  1. Avelox 400 mg 1 tab p.o. daily for 5 days.  2. Prednisone 60 mg for 1 day, 50 mg for 1 day, 40 mg for 1 day, 30 mg      for 1 day, 20 mg for 1 day, 10 mg for 1 day.  3. Plavix 75 mg 1 tab p.o. daily.  4. Aspirin 81 mg p.o. daily.  5. Folic acid 1 mg daily.  6. Amlodipine 5 mg daily.  7. Zantac 75 mg daily.  8. Vytorin 10/40 mg 1 tab daily.   The patient is instructed to discontinue taking his metoprolol secondary  to concern of bronchospasm until he is evaluated by Dr. Penni Watkins.   DISPOSITION AND FOLLOWUP:  The patient is to follow up with his primary  care physician, Dr. Penni Watkins, at [facility] on September 18, 2006, at 11 a.m.  At that point, Dr. Janey Watkins can assess the patient for  of resolution of his pulmonary symptoms, shortness of breath, cough, as  well as wheezing.  We are discharging  the patient on a course of Avelox  for total duration of treatment of 1 week, as well as a steroid taper  given that he had some bronchospasm on my exam.  I have also asked the  patient to hold taking his beta blocker secondary to concern of  extensive bronchospasm.  The patient has been labeled as having COPD;  however, I am not sure that he has ever had pulmonary function tests in  the past to fully document this or the severity of it.  Also to consider  adding a long-acting anticholinergic with or without steroid if the  patient does, in fact, have COPD.   PROCEDURES PERFORMED:  Chest x-ray revealed prominent interstitial  markings consistent either with interstitial edema or viral pneumonia.  There was some small effusions, difficult to exclude viral pneumonia.   CONSULTATIONS:  None.   BRIEF ADMISSION HISTORY AND  PHYSICAL:  Kevin Watkins is a 70 year old man with  coronary artery disease status post MI in 2001, and diffuse peripheral  vascular disease with multiple stenting procedures in the past, and a  greater than 100-pack-year history of smoking, who presented to the ED  with 5 days of progressively worsening shortness of breath, dyspnea on  exertion, and cough.  Approximately 5 days prior to admission, he awoke  feeling bad - citing general malaise, weakness, and shortness of  breath as his main symptoms.  He was evaluated in the urgent care clinic  on that evening and was told that he had influenza and was treated with  Tamiflu.  His symptoms since that time had not improved; in fact, they  had been somewhat worse.  He also had fever, chills, diaphoresis, and  over this time period, he had some diffuse myalgias.  His shortness of  breath has progressed over this time period.  Normally, he is fully  ambulatory around his house; however, now he is becoming short of breath  even with walking out to get his newspaper from his driveway.  His cough  has been a dry, hacking cough,  nonproductive of any sputum.  He denies  chest pain, denies palpitations, and any known sick contacts.  For  further details, please see the chart.   ADMISSION EXAMINATION:  VITAL SIGNS:  Temperature 97, blood pressure of  164/87, pulse of 72, respiratory rate of 22, O2 sats of 100% on 3  liters.  GENERAL:  This is a normal-appearing man in no apparent distress.  HEENT:  Eyes:  Pupils equal, round, and reactive to light.  Extraocular  muscles are intact.  Oropharynx:  He is edentulous with dentures in  place.  NECK:  Supple, no JVD, no thyromegaly.  RESPIRATORY EXAM:  He had very coarse rhonchi throughout bilaterally in  the anterior and posterior lung fields.  He had occasional end-  expiratory wheezing but decent air movement overall (post nebs and  steroids)  CARDIOVASCULAR:  Was a difficult exam secondary to increased noise from  respiratory exam, but however, he had a regular rate and rhythm with no  murmurs, rubs, or gallops appreciated.  ABDOMEN:  Soft, nontender, nondistended.  He had active bowel sounds  with no masses, diffuse-appearing erythematous macular eruption over his  lower abdomen below the umbilicus.  EXTREMITIES:  No cyanosis, clubbing, or edema.  SKIN:  He had diffuse exanthematous skin with many cysts, closed cysts  on his back.  LYMPHS:  None was appreciated.  His joints were normal.  No effusions.  NEURO:  Alert and oriented x3.  II-XII are grossly intact.  He had no  focal deficits.  PSYCH:  He was appropriate.   ADMISSION LABORATORY:  Sodium 129, potassium 4.3, chloride 98, bicarb  23, BUN 8, creatinine 0.9, glucose 135, white blood cell count 4.5,  hemoglobin 14.4, platelets 111, ANC 3.3, MCV of 93, BNP was 220, point  of care cardiac enzymes were negative with a troponin of less than 0.05.  Influenza A and B nasal swab were negative.   HOSPITAL COURSE:  1. Cough and shortness of breath:  Believed to be secondary to     bilateral pneumonia.   Certainly, given the patient's symptoms,      influenza was considered.  However, I do not see any documentation      that he had ever been tested for influenza at the urgent care      center.  Our  nasal swab here for Influenza was negative; however,      given the epidemiology of the recent season as well as his      presentation of symptoms it is still high in the differential.  The      patient could have had a viral pneumonia or even an atypical      pneumonia.  The patient was treated with IV antibiotics and IV      fluids.  He received Avelox IV for his 2 days during his      hospitalization.  He will be discharged with Avelox as well for a      duration of treatment of 1 week.  Notably, the patient had      significant wheeze on his exam during the second day of admission.      I've found on summaries that the patient has been labeled with COPD      in the past; however, I do not see any pulmonary function test.s  I      think that he may benefit from these as an outpatient after he      resolves his acute illness.  Notably, the patient has greater than      100-pack-year history of smoking.  I highly suspect that he does      have COPD regardless of the PFTs.  I have discharged him with a      steroid taper, and I told him to hold off on metoprolol at least      until he is evaluated by his primary care physician in the first      week of March.   1. Thrombocytopenia:  Notably, the patient's platelets were low at      admission 111,000, remained low throughout his hospitalization at      115,000.  I have reviewed in the past the patient's platelets.  In      November of 2006, they were of 161,00.  In March of 2007, they were      135,000.  The patient actually endorses that he consumes alcohol on      a nightly basis, 6 or 7 beers every night of the week.  Decreased      platelets are probably secondary to underlying alcoholism.  There      has been no acute change.   1.  Coronary artery disease, with a history of wall inferior MI:  We      obtained an EKG on admission as well as cycled cardiac enzymes.      There are no acute changes on his EKG consistent with ischemia.      His cardiac enzymes remained normal throughout.  We continued the      patient on Statin, aspirin therapy, as well as Plavix.   1. Severe peripheral vascular disease:  The patient notably has      undergone multiple stenting procedures by Dr. Allyson Sabal.  During this      admission, he does have discrepancy with blood pressures; in his      right arm:  blood pressure was 109/77, on the left was 178/74 with      decreased pulses on the right.  This is not an active issue during      this hospitalization.  He apparently follows up pretty regularly      with Dr. Allyson Sabal.  We have continued the patient's Plavix as well as      the Statin  and aspirin.   1. Hyponatremia:  The patient was probably a little volume depleted,     and with administration of normal saline, his sodium returned to      normal levels of 139 on the day of discharge.   1. Chronic ongoing tobacco abuse:  The patient was counseled multiple      times regarding smoking cessation.  Apparently, he has tried      Chantix in the past with no success.  We re-offered that again in      the hospital, which he declined.  He did, however, a nicotine patch      secondary to cravings.  The patient is probably in pre-      contemplative stage of change and certainly this is an issue that      continues to need to be addressed.   1. Hypertension:  The patient's beta blocker was held (as discussed      above), and we continued treating him with Norvasc.  His blood      pressure was elevated during this hospitalization.  On the day of      discharge, it was 149/74 - again, noting the discrepancy between      the two both arms; I believe this was from his left arm.  I will      defer again to his primary care physician but possibly, the  patient      could be treated with maybe an ACE inhibitor or angiotensin      receptor blocker, potentially a diuretic as he is already on a      calcium channel blocker.  I will have the patient record his blood      pressure and bring it in to his primary care physician in a journal      log, so that he can have an idea what his pressures have been      running after discharge.   1. History of extensive alcohol use:  The patient was monitored on      CIWA protocol and demonstrates no signs of symptoms of withdrawal.   1. Dermatitis:  The patient notably has a diffuse area of erythematous      eruption below the umbilicus on his abdomen.  I believe this is      contact dermatitis, probably secondary to nickel from his belt.  I      have instructed the patient to refrain from wearing his belt and to      apply topical preparation of over-the-counter hydrocortisone for      the 2 weeks leading up to his hospital followup with Dr. Penni Watkins.      If it has not resolved in 2 weeks, the patient probably warrants      referral to a dermatologist.  Notably, the patient has extensive      sun-damaged skin.  It looks like he has many closed cysts on his      back; however, he probably would want referral for dermatologic      evaluation at least for a skin check for potential melanoma, AK's,      squamous cell or other concerning findings secondary to chronic sun      damage.   DISCHARGE LABORATORY AND VITALS:  On the day of discharge, the patient's  temperature is 98.5, blood pressure 149/74, heart rate is 74,  respiratory rate 20, O2 sats were 95% on room air.  The patient's labs  were notable for white blood cell count of 1.1, hemoglobin 13.1,  platelets 137, sodium 139, potassium 4.7, chloride 102, bicarb 30, BUN  13, creatinine 0.93, glucose 187.  Microbiology dated made this  admission:  Blood cultures were x2 negative to date.  Respiratory culture revealed normal oropharyngeal flora.  In  general, the patient  was moving decent amount of air.  He still has occasional coarse breath  sounds with occasional end-expiratory wheeze.  His O2 sats were normal  pre and post ambulation, and he was entirely stable for discharge.      Antony Contras, M.D.  Electronically Signed      Kevin Watkins, M.D.  Electronically Signed    GL/MEDQ  D:  09/04/2006  T:  09/04/2006  Job:  [id number]   cc:   Dr. Thea Silversmith, M.D.

## 2011-07-12 HISTORY — PX: TRANSTHORACIC ECHOCARDIOGRAM: SHX275

## 2011-07-12 HISTORY — PX: OTHER SURGICAL HISTORY: SHX169

## 2012-06-28 ENCOUNTER — Other Ambulatory Visit (HOSPITAL_COMMUNITY): Payer: Self-pay | Admitting: Cardiovascular Disease

## 2012-06-28 DIAGNOSIS — R911 Solitary pulmonary nodule: Secondary | ICD-10-CM

## 2012-07-30 ENCOUNTER — Ambulatory Visit (HOSPITAL_COMMUNITY)
Admission: RE | Admit: 2012-07-30 | Discharge: 2012-07-30 | Disposition: A | Discharge status: Home / Self Care | Payer: Medicare Other | Source: Ambulatory Visit | Attending: Cardiovascular Disease | Admitting: Cardiovascular Disease

## 2012-07-30 DIAGNOSIS — R911 Solitary pulmonary nodule: Secondary | ICD-10-CM | POA: Insufficient documentation

## 2012-07-30 MED ORDER — IOHEXOL 300 MG/ML  SOLN
80.0000 mL | Freq: Once | INTRAMUSCULAR | Status: AC | PRN
  Administered 2012-07-30: 80 mL via INTRAVENOUS

## 2013-02-15 ENCOUNTER — Encounter: Payer: Self-pay | Admitting: Cardiology

## 2013-02-15 ENCOUNTER — Ambulatory Visit (INDEPENDENT_AMBULATORY_CARE_PROVIDER_SITE_OTHER): Payer: Medicare Other | Admitting: Cardiology

## 2013-02-15 ENCOUNTER — Telehealth: Payer: Self-pay | Admitting: Cardiology

## 2013-02-15 VITALS — BP 166/68 | HR 58 | Ht 67.0 in | Wt 210.7 lb

## 2013-02-15 DIAGNOSIS — I779 Disorder of arteries and arterioles, unspecified: Secondary | ICD-10-CM

## 2013-02-15 DIAGNOSIS — R600 Localized edema: Secondary | ICD-10-CM

## 2013-02-15 DIAGNOSIS — I739 Peripheral vascular disease, unspecified: Secondary | ICD-10-CM

## 2013-02-15 DIAGNOSIS — E785 Hyperlipidemia, unspecified: Secondary | ICD-10-CM

## 2013-02-15 DIAGNOSIS — R609 Edema, unspecified: Secondary | ICD-10-CM

## 2013-02-15 DIAGNOSIS — F17200 Nicotine dependence, unspecified, uncomplicated: Secondary | ICD-10-CM

## 2013-02-15 DIAGNOSIS — I701 Atherosclerosis of renal artery: Secondary | ICD-10-CM

## 2013-02-15 DIAGNOSIS — I2510 Atherosclerotic heart disease of native coronary artery without angina pectoris: Secondary | ICD-10-CM

## 2013-02-15 DIAGNOSIS — I771 Stricture of artery: Secondary | ICD-10-CM

## 2013-02-15 DIAGNOSIS — Z720 Tobacco use: Secondary | ICD-10-CM

## 2013-02-15 DIAGNOSIS — I10 Essential (primary) hypertension: Secondary | ICD-10-CM

## 2013-02-15 MED ORDER — POTASSIUM CHLORIDE ER 10 MEQ PO CPCR: 10.0000 meq | ORAL_CAPSULE | Freq: Two times a day (BID) | ORAL | Status: DC

## 2013-02-15 MED ORDER — POTASSIUM CHLORIDE ER 10 MEQ PO TBCR: 10.0000 meq | EXTENDED_RELEASE_TABLET | Freq: Every day | ORAL | Status: DC

## 2013-02-15 MED ORDER — FUROSEMIDE 20 MG PO TABS: 20.0000 mg | ORAL_TABLET | Freq: Every day | ORAL | Status: DC

## 2013-02-15 NOTE — Assessment & Plan Note (Signed)
Followed by his primary care doctor, Dr. Alvira Monday,

## 2013-02-15 NOTE — Telephone Encounter (Signed)
Pt was here this morning and stated that his Lasix was not called in to [facility]---[phone]

## 2013-02-15 NOTE — Assessment & Plan Note (Signed)
Controlled.  

## 2013-02-15 NOTE — Patient Instructions (Addendum)
Take 2 of the Lasix 20 mg daily for 3 days then decrease to 1 tablet daily  Take kdur for potassium 2 daily for 3 days then 1 daily  Call if any lightheadedness or dizziness  If swelling continues please call.  Follow up with Dr . Allyson Sabal in 6 months  Continue with low fat diet  You will be scheduled for carotid dopplers, upper ext dopplers, and renal artery dopplers.  Have repeat lab done in 2 weeks.   Stop/decrease smoking.  Decrease to 10 cigarettes a day.

## 2013-02-15 NOTE — Assessment & Plan Note (Signed)
Stent to the RCA bare-metal stent December 2001, known 60% mid dominant RCA stenosis and 50-60% OM 2 branch stenosis normal LV function. Last Myoview May 2011 negative for ischemia. No chest pain, mild shortness of breath with exertion though this is chronic

## 2013-02-15 NOTE — Telephone Encounter (Signed)
Rxs sent to [facility].  Rxs reordered and sent to [facility] per pt request.  Call to [facility] to cancel Rxs sent.  Returned call to pt and informed.  Pt verbalized understanding and agreed w/ plan.

## 2013-02-15 NOTE — Assessment & Plan Note (Addendum)
Has had some lower extremity edema but currently worse his feet are quite swollen. Medications adjusted with Lasix 20 mg tablets to take 2 daily for 3 days and then one daily also added potassium 10 mEq 2 daily for 3 days and then decrease to 1 daily.  He'll need a basic metabolic panel in 2 weeks. If this does not improve his edema he will call us otherwise he'll see Dr. Allyson Sabal in 6 months.

## 2013-02-15 NOTE — Telephone Encounter (Signed)
Pt needs his medication called into [facility] please

## 2013-02-15 NOTE — Assessment & Plan Note (Signed)
Stents in the right renal right subclavian and known disease of the carotid left, and left subclavian.  We will schedule for carotid Dopplers upper extremity Dopplers and renal artery Dopplers.  He'll follow with Dr. Allyson Sabal in 6 months unless these tests are abnormal.

## 2013-02-15 NOTE — Assessment & Plan Note (Signed)
We discussed for approximately 5 minutes the importance of stopping tobacco with his significant vascular disease. He is aware of that though he unfortunately is smoking one pack per day. I have asked him to cut back to 10 cigarettes a day and trying to decide which are his 5 best cigarettes in a day, trying to cutdown.  I asked him not to smoke in his house as some of the smoking is from habit alone.

## 2013-02-15 NOTE — Progress Notes (Signed)
02/15/2013   PCP: Esperanza Richters, PA-C   Chief Complaint  Patient presents with  . 6 month visit    no chest pain, edema feet and leg, little sob    Primary Cardiologist:Dr. Allyson Sabal  HPI:  72 year old white married male followed by Dr. Allyson Sabal for coronary artery disease as well as peripheral vascular disease. He has had a stent to his RCA bare-metal stent in December 2001. He also had residual disease of 60% mid dominant RCA stenosis as well as 50-60% OM 2 branch stenosis with normal LV function. His last nuclear stress test was May of 2000 211 which was negative for ischemia. Today he denies any chest pain or shortness of breath  Other history includes right subclavian and innominate vessel stenting as well as stenting of his right renal artery. 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 He has chronic blood pressure discrepancies with the left arm being higher than the right arm.  Patient continues to smoke one pack per day which is down from 1-2 packs, at times he uses E cigarette.  We discussed the importance of stopping tobacco and ways to achieve this. Additionally he has hypertension and hyperlipidemia. PCP follows his cholesterol panel. Most recent Dopplers were in 2013, and it's time for him to have these repeated.  Today he has no chest pain no shortness of breath though he does admit that at times he watches what he does because he feels slightly short of breath but this is chronic for him. Additionally he has some chronic lower extremity edema but is much worse today he heard 2+ pitting edema.  Last Tuesday at 1001 January 2013 EF was greater than 55% mild mitral regurg, mild tricuspid regurg  No Known Allergies  Current Outpatient Prescriptions  Medication Sig Dispense Refill  . amLODipine (NORVASC) 5 MG tablet Take 5 mg by mouth daily.      [name] aspirin EC 81 MG tablet Take 81 mg by mouth daily.      . folic acid (FOLVITE) 1  MG tablet Take 1 mg by mouth daily.      . Garlic 1000 MG CAPS Take by mouth.      . nebivolol (BYSTOLIC) 10 MG tablet Take 10 mg by mouth daily.      . nitroGLYCERIN (NITROSTAT) 0.4 MG SL tablet Place 0.4 mg under the tongue every 5 (five) minutes as needed for chest pain.      . Omega-3 Fatty Acids (FISH OIL) 300 MG CAPS Take by mouth.      . Polyethylene Glycol 3350 (MIRALAX PO) Take by mouth.      . pyridOXINE (VITAMIN B-6) 50 MG tablet Take 50 mg by mouth daily.      . ranitidine (ZANTAC) 75 MG tablet Take 75 mg by mouth daily.      . vitamin E (VITAMIN E) 400 UNIT capsule Take 400 Units by mouth daily.      . Wheat Dextrin (BENEFIBER PO) Take by mouth.      [name] atorvastatin (LIPITOR) 40 MG tablet       . benazepril (LOTENSIN) 20 MG tablet       . clopidogrel (PLAVIX) 75 MG tablet       . furosemide (LASIX) 20 MG tablet Take 1 tablet (20 mg total) by mouth daily.  90 tablet  3  . potassium chloride (MICRO-K) 10 MEQ CR capsule Take 1 capsule (10 mEq total) by  mouth 2 (two) times daily.  90 capsule  3  . SPIRIVA HANDIHALER 18 MCG inhalation capsule       . ZETIA 10 MG tablet        No current facility-administered medications for this visit.    Past Medical History  Diagnosis Date  . CAD in native artery     Stent to the RCA bare-metal stent December 2001, known 60% mid dominant RCA stenosis and 50-60% OM 2 branch stenosis normal LV function. Last Myoview May 2011 negative for ischemia.  [name] PAD (peripheral artery disease)      History of stent to the right subclavian and innominate vessel, stent to right renal artery.   . Tobacco abuse   . Dyslipidemia, goal LDL below 70   . Hypertension     Past Surgical History  Procedure Laterality Date  . Coronary angioplasty with stent placement  2001    medtronic S7 BMS -RCA; residual 60%mid dominant RCA stent, 50-60% OM2 stenosis  . Renal angiogram  2007    stent to Rt renal artery  . 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 The above number with his left arm.  This is similar to previous recordings.  General:Pleasant affect, NAD Skin:Warm and dry, brisk capillary refill HEENT:normocephalic, sclera clear, mucus membranes moist Neck:supple, no JVD, +rt bruits  Heart:S1S2 RRR with soft systolic murmur, nogallup, rub or click Lungs:clear without rales, rhonchi, or wheezes WJX:BJYN, non tender, + BS, do not palpate liver spleen or masses Ext:1-2+ lower ext edema from ankles through feet, 1+ pedal pulses, 2+ radial pulses Neuro:alert and oriented, MAE, follows commands, + facial symmetry  EKG: Sinus bradycardia rate of 58 but no acute changes from previous tracings.  ASSESSMENT AND PLAN CAD in native artery Stent to the RCA bare-metal stent December 2001, known 60% mid dominant RCA stenosis and 50-60% OM 2 branch stenosis normal LV function. Last Myoview May 2011 negative for ischemia. No chest pain, mild shortness of breath with exertion though this is chronic   PAD (peripheral artery disease) Stents in the right renal right subclavian and known disease of the carotid left, and left subclavian.  We will schedule for carotid Dopplers upper extremity Dopplers and renal artery Dopplers.  He'll follow with Dr. Allyson Sabal in 6 months unless these tests are abnormal.  Hypertension Controlled.  Dyslipidemia, goal LDL below 70 Followed by  his primary care doctor, Dr. Alvira Monday,  Tobacco abuse We discussed for approximately 5 minutes the importance of stopping tobacco with his significant vascular disease. He is aware of that though he unfortunately is smoking one pack per day. I have asked him to cut back to 10 cigarettes a day and trying to decide which are his 5 best cigarettes in a day, trying to cutdown.  I asked him not to smoke in his house as some of the smoking is from habit alone.  Bilateral leg edema  Has had some lower extremity edema but currently worse his feet are quite swollen. Medications adjusted with Lasix 20 mg tablets to take 2 daily for 3 days and then one daily also added potassium 10 mEq 2 daily for 3 days and then decrease to 1 daily.  He'll need a basic metabolic panel in 2 weeks. If this does not improve his edema he will call us otherwise he'll see Dr. Allyson Sabal in 6 months.

## 2013-02-22 ENCOUNTER — Telehealth: Payer: Self-pay | Admitting: Cardiovascular Disease

## 2013-02-22 NOTE — Telephone Encounter (Signed)
Message forwarded to B. Hager, PA-C for further instructions.  

## 2013-02-22 NOTE — Telephone Encounter (Signed)
Returned call and informed pt per instructions by MD/PA.  Pt also advised to stay hydrated and call back between Monday and Wednesday w/ an update on swelling and daily BP checks.  Pt verbalized understanding and agreed w/ plan.    RN will assess hydration status and BP checks when pt calls back.

## 2013-02-22 NOTE — Telephone Encounter (Signed)
Kevin Watkins is stating that the medication that he was just prescribe is not taking any of the swelling down,(Furosemide 20mg ) Also he wants to know should he up how many he takes or should he hae something else.. Please Call   Thanks

## 2013-02-22 NOTE — Telephone Encounter (Signed)
Increase from 20mg  twice daily to 40 mg twice daily.  Continued increased potassium.

## 2013-02-25 NOTE — Telephone Encounter (Signed)
Continue current dose for today then resume once daily,  20mg .  BMET as scheduled previously.  Daily morning weight.  If he gains 2 # in 24hrs or 5 # in 7 days, increase dose to 40 MG QAM for three days.  Keep sodium intake to < 2000mg  daily.   Teriyah Purington 1:55 PM

## 2013-02-25 NOTE — Telephone Encounter (Signed)
Pt was returning call with an update that was requested.

## 2013-02-25 NOTE — Telephone Encounter (Signed)
Returned call.  Pt stated he feels much better and the swelling is going down.  Stated his BP has averaged 134/71 HR 59 since he started taking it on Friday.  Pt stated he still has some swelling, but the medicine is working.  Pt wanted to know how long he is supposed to continue the current dose.  Pt advised to continue taking the current dose at 40 mg BID until further notice by RN or if swelling goes down before RN calls he is to call the office.  Pt verbalized understanding and agreed w/ plan.  Message forwarded to B. Leron Croak, PA-C for further instructions.

## 2013-02-25 NOTE — Telephone Encounter (Signed)
Returned call and informed pt per instructions by MD/PA.  Pt informed RN will mail instructions as well.  Pt verbalized understanding and agreed w/ plan.  Pt repeated instructions for today and tomorrow to RN while on phone.  Letter mailed and information also sent via MyChart.

## 2013-02-28 ENCOUNTER — Ambulatory Visit (HOSPITAL_COMMUNITY)
Admission: RE | Admit: 2013-02-28 | Discharge: 2013-02-28 | Disposition: A | Discharge status: Home / Self Care | Payer: Medicare Other | Source: Ambulatory Visit | Attending: Internal Medicine | Admitting: Internal Medicine

## 2013-02-28 DIAGNOSIS — I739 Peripheral vascular disease, unspecified: Secondary | ICD-10-CM | POA: Insufficient documentation

## 2013-02-28 DIAGNOSIS — I6529 Occlusion and stenosis of unspecified carotid artery: Secondary | ICD-10-CM

## 2013-02-28 DIAGNOSIS — R011 Cardiac murmur, unspecified: Secondary | ICD-10-CM | POA: Insufficient documentation

## 2013-02-28 NOTE — Progress Notes (Signed)
Carotid Duplex Completed. Claire Bridge, BS, RDMS, RVT  

## 2013-03-07 ENCOUNTER — Encounter: Payer: Self-pay | Admitting: *Deleted

## 2013-03-07 ENCOUNTER — Telehealth: Payer: Self-pay | Admitting: *Deleted

## 2013-03-07 DIAGNOSIS — I6529 Occlusion and stenosis of unspecified carotid artery: Secondary | ICD-10-CM

## 2013-03-07 NOTE — Telephone Encounter (Signed)
Message copied by Marella Bile on Thu Mar 07, 2013  3:20 PM ------      Message from: Runell Gess      Created: Thu Mar 07, 2013  3:02 PM       No change from prior study. Repeat in 6 months ------

## 2013-03-12 ENCOUNTER — Ambulatory Visit (HOSPITAL_BASED_OUTPATIENT_CLINIC_OR_DEPARTMENT_OTHER)
Admission: RE | Admit: 2013-03-12 | Discharge: 2013-03-12 | Disposition: A | Discharge status: Home / Self Care | Payer: Medicare Other | Source: Ambulatory Visit | Attending: Cardiology | Admitting: Cardiology

## 2013-03-12 ENCOUNTER — Ambulatory Visit (HOSPITAL_COMMUNITY)
Admission: RE | Admit: 2013-03-12 | Discharge: 2013-03-12 | Disposition: A | Discharge status: Home / Self Care | Payer: Medicare Other | Source: Ambulatory Visit | Attending: Cardiovascular Disease | Admitting: Cardiovascular Disease

## 2013-03-12 DIAGNOSIS — I10 Essential (primary) hypertension: Secondary | ICD-10-CM

## 2013-03-12 DIAGNOSIS — I771 Stricture of artery: Secondary | ICD-10-CM | POA: Insufficient documentation

## 2013-03-12 DIAGNOSIS — I701 Atherosclerosis of renal artery: Secondary | ICD-10-CM

## 2013-03-12 DIAGNOSIS — G458 Other transient cerebral ischemic attacks and related syndromes: Secondary | ICD-10-CM

## 2013-03-12 DIAGNOSIS — I739 Peripheral vascular disease, unspecified: Secondary | ICD-10-CM | POA: Insufficient documentation

## 2013-03-12 NOTE — Progress Notes (Signed)
Renal Duplex Completed. Paola Aleshire, BS, RDMS, RVT  

## 2013-03-12 NOTE — Progress Notes (Signed)
Arterial Upper Ext. Duplex Completed. Marilynne Halsted, BS, RDMS, RVT

## 2013-03-25 ENCOUNTER — Encounter: Payer: Self-pay | Admitting: *Deleted

## 2013-03-25 ENCOUNTER — Telehealth: Payer: Self-pay | Admitting: *Deleted

## 2013-03-25 DIAGNOSIS — I739 Peripheral vascular disease, unspecified: Secondary | ICD-10-CM

## 2013-03-25 DIAGNOSIS — I701 Atherosclerosis of renal artery: Secondary | ICD-10-CM

## 2013-03-25 NOTE — Telephone Encounter (Signed)
Order placed for recall dopplers of renal arteries and upper extremity arteries

## 2013-03-25 NOTE — Telephone Encounter (Signed)
Message copied by Marella Bile on Mon Mar 25, 2013  9:50 AM ------      Message from: Runell Gess      Created: Tue Mar 19, 2013  5:04 PM       No change from prior study. Repeat in 12 months. ------

## 2013-05-10 ENCOUNTER — Encounter: Payer: Self-pay | Admitting: Cardiovascular Disease

## 2013-05-16 ENCOUNTER — Other Ambulatory Visit: Payer: Self-pay

## 2013-09-04 ENCOUNTER — Telehealth (HOSPITAL_COMMUNITY): Payer: Self-pay | Admitting: *Deleted

## 2013-09-05 ENCOUNTER — Encounter (HOSPITAL_COMMUNITY): Payer: Medicare Other

## 2013-09-09 ENCOUNTER — Telehealth (HOSPITAL_COMMUNITY): Payer: Self-pay | Admitting: *Deleted

## 2013-09-16 ENCOUNTER — Ambulatory Visit (HOSPITAL_COMMUNITY)
Admission: RE | Admit: 2013-09-16 | Discharge: 2013-09-16 | Disposition: A | Discharge status: Home / Self Care | Payer: Medicare Other | Source: Ambulatory Visit | Attending: Cardiovascular Disease | Admitting: Cardiovascular Disease

## 2013-09-16 DIAGNOSIS — I6529 Occlusion and stenosis of unspecified carotid artery: Secondary | ICD-10-CM | POA: Insufficient documentation

## 2013-09-16 DIAGNOSIS — I739 Peripheral vascular disease, unspecified: Secondary | ICD-10-CM

## 2013-09-16 NOTE — Progress Notes (Signed)
Carotid Duplex Completed. °Brianna L Mazza,RVT °

## 2013-09-19 ENCOUNTER — Ambulatory Visit (HOSPITAL_COMMUNITY)
Admission: RE | Admit: 2013-09-19 | Discharge: 2013-09-19 | Disposition: A | Discharge status: Home / Self Care | Payer: Medicare Other | Source: Ambulatory Visit | Attending: Cardiovascular Disease | Admitting: Cardiovascular Disease

## 2013-09-19 ENCOUNTER — Ambulatory Visit (HOSPITAL_BASED_OUTPATIENT_CLINIC_OR_DEPARTMENT_OTHER)
Admission: RE | Admit: 2013-09-19 | Discharge: 2013-09-19 | Disposition: A | Discharge status: Home / Self Care | Payer: Medicare Other | Source: Ambulatory Visit | Attending: Cardiovascular Disease | Admitting: Cardiovascular Disease

## 2013-09-19 DIAGNOSIS — I701 Atherosclerosis of renal artery: Secondary | ICD-10-CM | POA: Insufficient documentation

## 2013-09-19 DIAGNOSIS — I739 Peripheral vascular disease, unspecified: Secondary | ICD-10-CM

## 2013-09-19 NOTE — Progress Notes (Signed)
Upper Extremity Arterial Duplex Completed. °Brianna L Mazza,RVT °

## 2013-09-19 NOTE — Progress Notes (Signed)
Renal Artery Duplex Completed. °Brianna L Mazza,RVT °

## 2013-09-23 ENCOUNTER — Telehealth: Payer: Self-pay | Admitting: *Deleted

## 2013-09-23 DIAGNOSIS — I6529 Occlusion and stenosis of unspecified carotid artery: Secondary | ICD-10-CM

## 2013-09-23 NOTE — Telephone Encounter (Signed)
Order placed for repeat carotid dopplers in 6 months  

## 2013-09-23 NOTE — Telephone Encounter (Signed)
Message copied by Chauncy Lean on Mon Sep 23, 2013  7:10 PM ------      Message from: Lorretta Harp      Created: Sun Sep 22, 2013  6:46 PM       No change from prior study. Repeat in 6 months ------

## 2013-09-30 ENCOUNTER — Telehealth: Payer: Self-pay | Admitting: *Deleted

## 2013-09-30 DIAGNOSIS — I739 Peripheral vascular disease, unspecified: Secondary | ICD-10-CM

## 2013-09-30 NOTE — Telephone Encounter (Signed)
Message copied by Chauncy Lean on Mon Sep 30, 2013  9:22 PM ------      Message from: Lorretta Harp      Created: Mon Sep 30, 2013  6:47 AM       No change from prior study. Repeat in 6 months ------

## 2013-09-30 NOTE — Telephone Encounter (Signed)
Order placed for repeat renal dopplers in 6 months and upper extremity in 1 year

## 2013-10-28 ENCOUNTER — Telehealth: Payer: Self-pay | Admitting: *Deleted

## 2013-10-28 ENCOUNTER — Encounter: Payer: Self-pay | Admitting: *Deleted

## 2013-10-29 ENCOUNTER — Encounter: Payer: Self-pay | Admitting: Cardiovascular Disease

## 2013-10-29 ENCOUNTER — Ambulatory Visit: Payer: Medicare Other | Admitting: Cardiology

## 2013-10-30 ENCOUNTER — Ambulatory Visit (INDEPENDENT_AMBULATORY_CARE_PROVIDER_SITE_OTHER): Payer: Medicare Other | Admitting: Cardiology

## 2013-10-30 ENCOUNTER — Encounter: Payer: Self-pay | Admitting: Cardiology

## 2013-10-30 VITALS — BP 138/62 | HR 63 | Ht 68.0 in | Wt 219.0 lb

## 2013-10-30 DIAGNOSIS — Z720 Tobacco use: Secondary | ICD-10-CM

## 2013-10-30 DIAGNOSIS — I5031 Acute diastolic (congestive) heart failure: Secondary | ICD-10-CM

## 2013-10-30 DIAGNOSIS — F17200 Nicotine dependence, unspecified, uncomplicated: Secondary | ICD-10-CM

## 2013-10-30 DIAGNOSIS — R600 Localized edema: Secondary | ICD-10-CM

## 2013-10-30 DIAGNOSIS — I2510 Atherosclerotic heart disease of native coronary artery without angina pectoris: Secondary | ICD-10-CM

## 2013-10-30 DIAGNOSIS — R609 Edema, unspecified: Secondary | ICD-10-CM

## 2013-10-30 DIAGNOSIS — I10 Essential (primary) hypertension: Secondary | ICD-10-CM

## 2013-10-30 DIAGNOSIS — I5033 Acute on chronic diastolic (congestive) heart failure: Secondary | ICD-10-CM | POA: Insufficient documentation

## 2013-10-30 DIAGNOSIS — I509 Heart failure, unspecified: Secondary | ICD-10-CM

## 2013-10-30 DIAGNOSIS — I739 Peripheral vascular disease, unspecified: Secondary | ICD-10-CM

## 2013-10-30 MED ORDER — FUROSEMIDE 40 MG PO TABS
80.0000 mg | ORAL_TABLET | Freq: Every day | ORAL | Status: DC
Start: 2013-10-30 — End: 2016-05-30

## 2013-10-30 NOTE — Assessment & Plan Note (Signed)
This is his main complaint today

## 2013-10-30 NOTE — Assessment & Plan Note (Signed)
No angina 

## 2013-10-30 NOTE — Assessment & Plan Note (Signed)
Controlled.  

## 2013-10-30 NOTE — Progress Notes (Signed)
10/30/2013 Kevin Watkins   [birth year]  [id number]  Primary Physicia Kevin Watkins Primary Cardiologist: Dr Kevin Watkins  HPI:  Pleasant 73 y/o followed by Dr Kevin Watkins with CAD and PVD. He had an RCA BMS in 2001, Myoview was low risk 5/11. His last echo was Jan 2013- EF 55%. He has PVD and has several interventions. He had an innominatet artery PTA in 5/01 with re do 12/03, Jan '06 as well as RSCA stenting at that time. He has had remote bilateral iliac PTA. He had Rt RA stenting in March '07. He has been fairly stable since. Dopplers in March 2015 showed 60-99% Rt RAS but positive flow. This was unchanged. His B/P has been controlled and SCr have been normal. His Rt SCA stent is occluded but he is not symptomatic. He has moderate bilat ICA disease by doppler 9.14.             He presents to the office today with complaints of edema. His primary care provider has been appropriately adjusting his medications, increasing his diuretics and decreasing his Amlodipine. He had a BNP that was elevated 233, and a CXR that suggested CHF. The pt does not have orthopnea but admits to some increased DOE. He denies angina. In discussing his diet he admits he has been eating a lot of salt. He apparently had low Na+ (128) on a lab earlier this year and it was suggested he eat more salt- "I am eating lots of salt". His serum sodium did improve to 133. His wgt earlier this year was around 10, his wgt recently has been as high as 212. He has had some improvement with recent medication adjustments but his primary care provider felt he should be evaluated here.   Current Outpatient Prescriptions  Medication Sig Dispense Refill  . amLODipine (NORVASC) 5 MG tablet Take 5 mg by mouth daily.      [name] aspirin EC 81 MG tablet Take 81 mg by mouth daily.      [name] atenolol (TENORMIN) 50 MG tablet       . atorvastatin (LIPITOR) 40 MG tablet       . benazepril (LOTENSIN) 20 MG tablet       . clopidogrel (PLAVIX) 75 MG tablet       .  folic acid (FOLVITE) 1 MG tablet Take 1 mg by mouth daily.      . Garlic 8099 MG CAPS Take by mouth.      . nitroGLYCERIN (NITROSTAT) 0.4 MG SL tablet Place 0.4 mg under the tongue every 5 (five) minutes as needed for chest pain.      . Omega-3 Fatty Acids (FISH OIL) 300 MG CAPS Take by mouth.      . Polyethylene Glycol 3350 (MIRALAX PO) Take by mouth.      . potassium chloride (MICRO-K) 10 MEQ CR capsule Take 1 capsule (10 mEq total) by mouth 2 (two) times daily.  90 capsule  3  . pyridOXINE (VITAMIN B-6) 50 MG tablet Take 50 mg by mouth daily.      . ranitidine (ZANTAC) 75 MG tablet Take 75 mg by mouth daily.      [name] SPIRIVA HANDIHALER 18 MCG inhalation capsule       . traZODone (DESYREL) 50 MG tablet       . vitamin E (VITAMIN E) 400 UNIT capsule Take 400 Units by mouth daily.      . Wheat Dextrin (BENEFIBER PO) Take by mouth.      [name]  furosemide (LASIX) 40 MG tablet Take 2 tablets (80 mg total) by mouth daily.  90 tablet  3   No current facility-administered medications for this visit.    No Known Allergies  History   Social History  . Marital Status: Married    Spouse Name: N/A    Number of Children: N/A  . Years of Education: N/A   Occupational History  . Not on file.   Social History Main Topics  . Smoking status: Current Every Day Smoker -- 1.00 packs/day    Types: Cigarettes  . Smokeless tobacco: Never Used     Comment: also electronic cigarettes  . Alcohol Use: 12.6 oz/week    21 Cans of beer per week     Comment: beer  . Drug Use: No  . Sexual Activity: Not on file   Other Topics Concern  . Not on file   Social History Narrative  . No narrative on file     Review of Systems: General: negative for chills, fever, night sweats or weight changes.  Cardiovascular: negative for chest pain, orthopnea, palpitations, paroxysmal nocturnal dyspnea Dermatological: negative for rash Respiratory: negative for cough or wheezing Urologic: negative for  hematuria Abdominal: negative for nausea, vomiting, diarrhea, bright red blood per rectum, melena, or hematemesis Neurologic: negative for visual changes, syncope, or dizziness All other systems reviewed and are otherwise negative except as noted above.    Blood pressure 138/62, pulse 63, height 5\' 8"  (1.727 m), weight 219 lb (99.338 kg).  General appearance: alert, cooperative and no distress Neck: no JVD and bilat carotid bruits Lungs: few basilar crackles Lt> Rt Heart: regular rate and rhythm and early, short. 1-6/1 systolic murmur AOv Abdomen: trunckal obesity Extremities: 2+ pitting edema on Lt, 1-2+ on Rt. Chronic skin changes c/w chronic edema Neurologic: Grossly normal  EKG NSR  ASSESSMENT AND PLAN:   Bilateral leg edema This is his main complaint today  Acute diastolic CHF (congestive heart failure) Recent edema, some wgt increase, CXR in [place] suggested CHF  CAD-RCA BMS 2001, low risk Myoview 2011 No angina  Hypertension Controlled  PAD- multiple interventions- renal, iliac, SCA Dopplers in March showed no change in Rt RAS- 60-99% but positive flow. SCr 0.97 on 10/22/13  Tobacco abuse Continues to smoke   PLAN  I think [name]'s edema is multifactorial. He does have volume overload/CHF most likely from his increased Na+ intake. 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 Fortunately his renal function is still normal. I suggested he increase his Furosemide to 80 mg daily till his wgt comes down to 205, then cut back to 40 mg daily. He is to avoid sodium in his diet. He needs a BMP in one week and should see Dr Kevin Watkins in 2-3- weeks.   Doreene Burke [facility] 10/30/2013 8:51 AM

## 2013-10-30 NOTE — Assessment & Plan Note (Signed)
Recent edema, some wgt increase, CXR in [place] suggested CHF

## 2013-10-30 NOTE — Assessment & Plan Note (Signed)
Dopplers in March showed no change in Rt RAS- 60-99% but positive flow. SCr 0.97 on 10/22/13

## 2013-10-30 NOTE — Patient Instructions (Signed)
Take Furosemide 80 mg daily till your wgt gets down to 205 then decrease Furosemide to 40 mg daily. Avoid salt. Elevate your legs when possible. By support stockings.  Follow up with [name] in 2-3 weeks. Have BMP in one week.

## 2013-10-30 NOTE — Assessment & Plan Note (Signed)
Continues to smoke.

## 2013-10-31 ENCOUNTER — Telehealth: Payer: Self-pay | Admitting: Cardiovascular Disease

## 2013-10-31 NOTE — Telephone Encounter (Signed)
Closed encounter °

## 2013-11-06 ENCOUNTER — Encounter: Payer: Self-pay | Admitting: Cardiovascular Disease

## 2013-11-06 ENCOUNTER — Ambulatory Visit (INDEPENDENT_AMBULATORY_CARE_PROVIDER_SITE_OTHER): Payer: Medicare Other | Admitting: Cardiovascular Disease

## 2013-11-06 VITALS — BP 146/55 | HR 64 | Ht 68.0 in | Wt 216.0 lb

## 2013-11-06 DIAGNOSIS — E785 Hyperlipidemia, unspecified: Secondary | ICD-10-CM

## 2013-11-06 DIAGNOSIS — E782 Mixed hyperlipidemia: Secondary | ICD-10-CM

## 2013-11-06 DIAGNOSIS — I739 Peripheral vascular disease, unspecified: Secondary | ICD-10-CM

## 2013-11-06 DIAGNOSIS — I10 Essential (primary) hypertension: Secondary | ICD-10-CM

## 2013-11-06 DIAGNOSIS — R609 Edema, unspecified: Secondary | ICD-10-CM

## 2013-11-06 DIAGNOSIS — Z79899 Other long term (current) drug therapy: Secondary | ICD-10-CM

## 2013-11-06 DIAGNOSIS — R600 Localized edema: Secondary | ICD-10-CM

## 2013-11-06 DIAGNOSIS — I2510 Atherosclerotic heart disease of native coronary artery without angina pectoris: Secondary | ICD-10-CM

## 2013-11-06 NOTE — Progress Notes (Signed)
11/06/2013 Kevin Watkins   04-Sep-1940  [id number]  Primary Physician Saguier, Iris Pert Primary Cardiologist: Lorretta Harp MD [name]   HPI:  The patient is a 73 year old mildly overweight married Caucasian male father of 32, grandfather to 2 grandchildren who I last saw in the office 6 months ago. He has a history of CAD and PVOD. I stented his right coronary artery as a 3.5 mm x 12 mm long Medtronic S7 bare metal stent in December of 2001. At that time he did have a 60% mid dominant RCA stenosis as well as a 50-60% OM2 branch stenosis with normal LV function. He had a negative Myoview Nov 28, 2009 and denies chest pain or shortness of breath. I stented his right subclavian and innominate vessel as well as his right renal artery. He has no known ostial left common carotid artery stenosis and left subclavian artery stenosis as well. He is cutting down his cigarettes from 1 pack per day to 1-2 packs per week and is using an Engineer, manufacturing. His other problem include hypertension and hyperlipidemia. We are following his various Doppler studies in our office.he saw Kevin Ransom PA-C in the office one week ago with complaints of dyspnea or lower extremity edema. Apparently he was advised to liberalize his salt intake. We advised him to avoid salt, and increased his furosemide as well as decreased his amlodipine. He has lost 34 pounds. His edema has somewhat improved. We will continue to follow his basic metabolic panel and adjust his diuretics as necessary.   Current Outpatient Prescriptions  Medication Sig Dispense Refill  . amLODipine (NORVASC) 10 MG tablet Take 10 mg by mouth daily.      [name] aspirin EC 81 MG tablet Take 81 mg by mouth daily.      [name] atenolol (TENORMIN) 50 MG tablet Take 50 mg by mouth daily.       [name] atorvastatin (LIPITOR) 40 MG tablet Take 40 mg by mouth daily at 6 PM.       . benazepril (LOTENSIN) 20 MG tablet Take 20 mg by mouth daily.       . clopidogrel (PLAVIX)  75 MG tablet Take 75 mg by mouth once.       . folic acid (FOLVITE) 1 MG tablet Take 1 mg by mouth daily.      . furosemide (LASIX) 40 MG tablet Take 2 tablets (80 mg total) by mouth daily.  90 tablet  3  . Garlic 9937 MG CAPS Take by mouth.      . nitroGLYCERIN (NITROSTAT) 0.4 MG SL tablet Place 0.4 mg under the tongue every 5 (five) minutes as needed for chest pain.      . Omega-3 Fatty Acids (FISH OIL) 300 MG CAPS Take by mouth.      . Polyethylene Glycol 3350 (MIRALAX PO) Take by mouth.      . potassium chloride (MICRO-K) 10 MEQ CR capsule Take 1 capsule (10 mEq total) by mouth 2 (two) times daily.  90 capsule  3  . pyridOXINE (VITAMIN B-6) 50 MG tablet Take 50 mg by mouth daily.      . ranitidine (ZANTAC) 75 MG tablet Take 75 mg by mouth daily.      [name] SPIRIVA HANDIHALER 18 MCG inhalation capsule Place 18 mcg into inhaler and inhale daily.       . traZODone (DESYREL) 50 MG tablet Take 50 mg by mouth at bedtime.       . vitamin  E (VITAMIN E) 400 UNIT capsule Take 400 Units by mouth daily.      . Wheat Dextrin (BENEFIBER PO) Take by mouth.       No current facility-administered medications for this visit.    No Known Allergies  History   Social History  . Marital Status: Married    Spouse Name: N/A    Number of Children: N/A  . Years of Education: N/A   Occupational History  . Not on file.   Social History Main Topics  . Smoking status: Current Every Day Smoker -- 1.00 packs/day    Types: Cigarettes  . Smokeless tobacco: Never Used     Comment: also electronic cigarettes  . Alcohol Use: 12.6 oz/week    21 Cans of beer per week     Comment: beer  . Drug Use: No  . Sexual Activity: Not on file   Other Topics Concern  . Not on file   Social History Narrative  . No narrative on file     Review of Systems: General: negative for chills, fever, night sweats or weight changes.  Cardiovascular: negative for chest pain, dyspnea on exertion, edema, orthopnea, palpitations,  paroxysmal nocturnal dyspnea or shortness of breath Dermatological: negative for rash Respiratory: negative for cough or wheezing Urologic: negative for hematuria Abdominal: negative for nausea, vomiting, diarrhea, bright red blood per rectum, melena, or hematemesis Neurologic: negative for visual changes, syncope, or dizziness All other systems reviewed and are otherwise negative except as noted above.    Blood pressure 146/55, pulse 64, height 5\' 8"  (1.727 m), weight 216 lb (97.977 kg).  General appearance: alert and no distress Neck: no adenopathy, no JVD, supple, symmetrical, trachea midline, thyroid not enlarged, symmetric, no tenderness/mass/nodules and bilateral carotid and subclavian bruits Lungs: clear to auscultation bilaterally Heart: ssoft outflow tract murmur Extremities: 1+ right, 2+ left lower extremity pitting edema  EKG not performed today  ASSESSMENT AND PLAN:   Bilateral leg edema Patient saw Kevin Ransom PA-C was one week ago. His weight was up and he was dyspneic. His furosemide was increased from 40-80 mg a day. He follows his weight and blood pressure fairly impressively on a daily basis. He does have known normal LV function. Apparently he admitted to extreme dietary indiscretion with regard to salt and this has been addressed. We'll continue his increased dose of furosemide for one more week and check a  BMET as well as a fasting lipid liver profile. He will see Kevin Ransom PA-C back in one month and me back in one year  Dyslipidemia, goal LDL below 70 On statin therapy. We will recheck a lipid and liver profile  Hypertension Well-controlled on current medications  PAD- multiple interventions- renal, iliac, SCA He has had stenting to multiple a known occluded right subclavian artery. He has a 90% ostial left common carotid artery. I stented his right renal artery in the past as well (09/22/05. Continue to follow his carotid, upper extremity and renal Doppler  studies. 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  CAD-RCA BMS 2001, low risk Myoview 2011 History of CAD status post RCA intervention by myself back in December 2001 with a 3.5 mm x 12 overlong Medtronic S7 bare-metal stent. He also had 60% segmental mid-RCA stenosis as well as a 50-60% proximal OM1 branch stenosis with normal LV function at that time. He denies chest pain. His last Myoview performed 11/30/09 was normal.      Lorretta Harp  MD Lupe Carney, Raynald Kemp 11/06/2013 8:27 AM

## 2013-11-06 NOTE — Patient Instructions (Signed)
We request that you follow-up in: 1 month and 6 months with Lurena Joiner  and in 1 year with Dr Andria Rhein will receive a reminder letter in the mail two months in advance. If you don't receive a letter, please call our office to schedule the follow-up appointment.  Have blood work done in 3 weeks, fasting.

## 2013-11-06 NOTE — Assessment & Plan Note (Signed)
Well-controlled on current medications 

## 2013-11-06 NOTE — Assessment & Plan Note (Signed)
History of CAD status post RCA intervention by myself back in December 2001 with a 3.5 mm x 12 overlong Medtronic S7 bare-metal stent. He also had 60% segmental mid-RCA stenosis as well as a 50-60% proximal OM1 branch stenosis with normal LV function at that time. He denies chest pain. His last Myoview performed 11/30/09 was normal.

## 2013-11-06 NOTE — Assessment & Plan Note (Signed)
On statin therapy. We will recheck a lipid and liver profile 

## 2013-11-06 NOTE — Assessment & Plan Note (Signed)
He has had stenting to multiple a known occluded right subclavian artery. He has a 90% ostial left common carotid artery. I stented his right renal artery in the past as well (09/22/05. Continue to follow his carotid, upper extremity and renal Doppler studies. 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

## 2013-11-06 NOTE — Assessment & Plan Note (Signed)
Patient saw Kerin Ransom PA-C was one week ago. His weight was up and he was dyspneic. His furosemide was increased from 40-80 mg a day. He follows his weight and blood pressure fairly impressively on a daily basis. He does have known normal LV function. Apparently he admitted to extreme dietary indiscretion with regard to salt and this has been addressed. We'll continue his increased dose of furosemide for one more week and check a  BMET as well as a fasting lipid liver profile. He will see Kerin Ransom PA-C back in one month and me back in one year

## 2013-11-15 NOTE — Telephone Encounter (Signed)
Encounter Closed---5/8 TP 

## 2013-12-03 ENCOUNTER — Ambulatory Visit: Payer: Medicare Other | Admitting: Cardiovascular Disease

## 2013-12-11 ENCOUNTER — Encounter: Payer: Self-pay | Admitting: Cardiology

## 2013-12-11 ENCOUNTER — Ambulatory Visit (INDEPENDENT_AMBULATORY_CARE_PROVIDER_SITE_OTHER): Payer: Medicare Other | Admitting: Cardiology

## 2013-12-11 VITALS — BP 152/60 | HR 59 | Ht 68.0 in | Wt 211.1 lb

## 2013-12-11 DIAGNOSIS — R600 Localized edema: Secondary | ICD-10-CM

## 2013-12-11 DIAGNOSIS — I2510 Atherosclerotic heart disease of native coronary artery without angina pectoris: Secondary | ICD-10-CM

## 2013-12-11 DIAGNOSIS — I739 Peripheral vascular disease, unspecified: Secondary | ICD-10-CM

## 2013-12-11 DIAGNOSIS — Z01818 Encounter for other preprocedural examination: Secondary | ICD-10-CM

## 2013-12-11 DIAGNOSIS — R609 Edema, unspecified: Secondary | ICD-10-CM

## 2013-12-11 DIAGNOSIS — I10 Essential (primary) hypertension: Secondary | ICD-10-CM

## 2013-12-11 NOTE — Progress Notes (Signed)
12/11/2013 Kevin Watkins   May 23, 1941  [id number]  Primary Physicia Kevin Hipps, MD Primary Cardiologist: Dr Kevin Watkins  HPI:  Pleasant 73 y/o followed by Dr Kevin Watkins with CAD and PVD. He had an RCA BMS in 2001, Myoview was low risk 5/11. His last echo was Jan 2013- EF 55%. He has PVD and has several interventions. He had an innominatet artery PTA in 5/01 with re do 12/03, Jan '06 as well as RSCA stenting at that time. He has had remote bilateral iliac PTA. He had Rt RA stenting in March '07. He has been fairly stable since. Dopplers in March 2015 showed 60-99% Rt RAS but positive flow. This was unchanged. His B/P has been controlled and SCr have been normal. His Rt SCA stent is occluded but he is not symptomatic. He has moderate bilat ICA disease by doppler Sept 2014.           We saw him in April with lower extremity edema. Apparently he had been advised to liberalize his salt intake based on a low serum sodium level. We advised him to avoid salt, and increased his furosemide as well as decreased his amlodipine. His edema has improved and his wgt is down to 210 from 219. He has no complaints of chest pain. He is scheduled to have a colonoscopy and needed clearance.      Current Outpatient Prescriptions  Medication Sig Dispense Refill  . amLODipine (NORVASC) 5 MG tablet Take 5 mg by mouth daily.      [name] aspirin EC 81 MG tablet Take 81 mg by mouth daily.      [name] atenolol (TENORMIN) 50 MG tablet Take 50 mg by mouth daily.       [name] atorvastatin (LIPITOR) 40 MG tablet Take 40 mg by mouth daily at 6 PM.       . benazepril (LOTENSIN) 20 MG tablet Take 20 mg by mouth daily.       . clopidogrel (PLAVIX) 75 MG tablet Take 75 mg by mouth once.       . folic acid (FOLVITE) 1 MG tablet Take 1 mg by mouth daily.      . furosemide (LASIX) 40 MG tablet Take 2 tablets (80 mg total) by mouth daily.  90 tablet  3  . Garlic 9924 MG CAPS Take by mouth.      . nitroGLYCERIN (NITROSTAT) 0.4 MG SL tablet Place 0.4 mg  under the tongue every 5 (five) minutes as needed for chest pain.      . Omega-3 Fatty Acids (FISH OIL) 300 MG CAPS Take by mouth.      . Polyethylene Glycol 3350 (MIRALAX PO) Take by mouth.      . potassium chloride (MICRO-K) 10 MEQ CR capsule Take 1 capsule (10 mEq total) by mouth 2 (two) times daily.  90 capsule  3  . pyridOXINE (VITAMIN B-6) 50 MG tablet Take 50 mg by mouth daily.      . ranitidine (ZANTAC) 75 MG tablet Take 75 mg by mouth daily.      [name] SPIRIVA HANDIHALER 18 MCG inhalation capsule Place 18 mcg into inhaler and inhale daily.       . traZODone (DESYREL) 50 MG tablet Take 50 mg by mouth at bedtime.       . vitamin E (VITAMIN E) 400 UNIT capsule Take 400 Units by mouth daily.      . Wheat Dextrin (BENEFIBER PO) Take by mouth.  No current facility-administered medications for this visit.    No Known Allergies  History   Social History  . Marital Status: Married    Spouse Name: N/A    Number of Children: N/A  . Years of Education: N/A   Occupational History  . Not on file.   Social History Main Topics  . Smoking status: Current Every Day Smoker -- 1.00 packs/day    Types: Cigarettes  . Smokeless tobacco: Never Used     Comment: also electronic cigarettes  . Alcohol Use: 12.6 oz/week    21 Cans of beer per week     Comment: beer  . Drug Use: No  . Sexual Activity: Not on file   Other Topics Concern  . Not on file   Social History Narrative  . No narrative on file     Review of Systems: General: negative for chills, fever, night sweats or weight changes.  Cardiovascular: negative for chest pain, dyspnea on exertion, edema, orthopnea, palpitations, paroxysmal nocturnal dyspnea or shortness of breath Dermatological: negative for rash Respiratory: negative for cough or wheezing Urologic: negative for hematuria Abdominal: negative for nausea, vomiting, diarrhea, bright red blood per rectum, melena, or hematemesis Neurologic: negative for visual  changes, syncope, or dizziness All other systems reviewed and are otherwise negative except as noted above.    Blood pressure 152/60, pulse 59, height 5\' 8"  (1.727 m), weight 211 lb 1.6 oz (95.754 kg).  General appearance: alert, cooperative, no distress and moderately obese Lungs: decreased breath sounds Heart: regular rate and rhythm Abdomen: obese Extremities: trace to 1+ bilateral LE edema  EKG NSR Lab- from 11/27/13-Bun 12/ SCr 1.13. Na+ 135, K+ 4.6, LDL 63  ASSESSMENT AND PLAN:   Bilateral leg edema This has improved with reduction of sodium intake, increased diuretics, and support stockings.  CAD-RCA BMS 2001, low risk Myoview 2011 No angina  Hypertension Controlled  PAD- multiple interventions- renal, iliac, SCA Stable  Dyslipidemia, goal LDL below 70 LDL May 2015 63   PLAN  I had his labs from 11/27/13 faxed from Kevin Watkins in [place]. I suggested Kevin Watkins continue his current medical regimen. He knows he can increase his Lasix to 80 mg for a couple of days if his wgt goes over 215. He is OK to proceed with colonoscopy from a cardiac standpoint, hew can hold his Plavix 5 days pre-op.   Kevin Watkins Kevin Watkins 12/11/2013 2:06 PM

## 2013-12-11 NOTE — Assessment & Plan Note (Signed)
Controlled.  

## 2013-12-11 NOTE — Assessment & Plan Note (Signed)
LDL May 2015 63

## 2013-12-11 NOTE — Assessment & Plan Note (Signed)
This has improved with reduction of sodium intake, increased diuretics, and support stockings.

## 2013-12-11 NOTE — Assessment & Plan Note (Signed)
No angina 

## 2013-12-11 NOTE — Assessment & Plan Note (Signed)
Stable

## 2013-12-11 NOTE — Patient Instructions (Addendum)
Continue your current medications. You have been cleared for your colonoscopy.  Lurena Joiner recommends that you schedule a follow-up appointment in 6 months with [name].

## 2014-03-04 ENCOUNTER — Telehealth (HOSPITAL_COMMUNITY): Payer: Self-pay | Admitting: *Deleted

## 2014-03-14 ENCOUNTER — Ambulatory Visit (HOSPITAL_BASED_OUTPATIENT_CLINIC_OR_DEPARTMENT_OTHER)
Admission: RE | Admit: 2014-03-14 | Discharge: 2014-03-14 | Disposition: A | Discharge status: Home / Self Care | Payer: Medicare Other | Source: Ambulatory Visit | Attending: Cardiovascular Disease | Admitting: Cardiovascular Disease

## 2014-03-14 ENCOUNTER — Ambulatory Visit (HOSPITAL_COMMUNITY)
Admission: RE | Admit: 2014-03-14 | Discharge: 2014-03-14 | Disposition: A | Discharge status: Home / Self Care | Payer: Medicare Other | Source: Ambulatory Visit | Attending: Cardiovascular Disease | Admitting: Cardiovascular Disease

## 2014-03-14 ENCOUNTER — Telehealth: Payer: Self-pay | Admitting: *Deleted

## 2014-03-14 DIAGNOSIS — I658 Occlusion and stenosis of other precerebral arteries: Secondary | ICD-10-CM | POA: Diagnosis not present

## 2014-03-14 DIAGNOSIS — I701 Atherosclerosis of renal artery: Secondary | ICD-10-CM | POA: Diagnosis not present

## 2014-03-14 DIAGNOSIS — E785 Hyperlipidemia, unspecified: Secondary | ICD-10-CM | POA: Insufficient documentation

## 2014-03-14 DIAGNOSIS — I739 Peripheral vascular disease, unspecified: Secondary | ICD-10-CM

## 2014-03-14 DIAGNOSIS — I10 Essential (primary) hypertension: Secondary | ICD-10-CM

## 2014-03-14 DIAGNOSIS — I6529 Occlusion and stenosis of unspecified carotid artery: Secondary | ICD-10-CM

## 2014-03-14 DIAGNOSIS — I748 Embolism and thrombosis of other arteries: Secondary | ICD-10-CM | POA: Diagnosis not present

## 2014-03-14 DIAGNOSIS — I6523 Occlusion and stenosis of bilateral carotid arteries: Secondary | ICD-10-CM

## 2014-03-14 NOTE — Telephone Encounter (Signed)
Order placed for repeat carotid and renal dopplers in 6 months

## 2014-03-14 NOTE — Progress Notes (Signed)
Renal Duplex Completed. Bentley Fissel, BS, RDMS, RVT  

## 2014-03-14 NOTE — Telephone Encounter (Signed)
Message copied by Chauncy Lean on Fri Mar 14, 2014  2:31 PM ------      Message from: Lorretta Harp      Created: Fri Mar 14, 2014  1:51 PM       No change from prior study. Repeat in 6 months ------

## 2014-03-14 NOTE — Progress Notes (Signed)
Carotid Duplex Completed. Kevin Watkins, BS, RDMS, RVT  

## 2014-04-22 ENCOUNTER — Encounter: Payer: Self-pay | Admitting: Cardiovascular Disease

## 2014-04-25 ENCOUNTER — Other Ambulatory Visit: Payer: Self-pay

## 2014-06-03 ENCOUNTER — Encounter: Payer: Self-pay | Admitting: Physician Assistant

## 2014-06-03 ENCOUNTER — Ambulatory Visit (INDEPENDENT_AMBULATORY_CARE_PROVIDER_SITE_OTHER): Payer: Medicare Other | Admitting: Physician Assistant

## 2014-06-03 VITALS — BP 160/60 | HR 62 | Ht 67.0 in | Wt 210.0 lb

## 2014-06-03 DIAGNOSIS — I10 Essential (primary) hypertension: Secondary | ICD-10-CM

## 2014-06-03 DIAGNOSIS — Z720 Tobacco use: Secondary | ICD-10-CM

## 2014-06-03 DIAGNOSIS — E785 Hyperlipidemia, unspecified: Secondary | ICD-10-CM

## 2014-06-03 DIAGNOSIS — I739 Peripheral vascular disease, unspecified: Secondary | ICD-10-CM

## 2014-06-03 DIAGNOSIS — I2510 Atherosclerotic heart disease of native coronary artery without angina pectoris: Secondary | ICD-10-CM

## 2014-06-03 DIAGNOSIS — I5032 Chronic diastolic (congestive) heart failure: Secondary | ICD-10-CM

## 2014-06-03 NOTE — Assessment & Plan Note (Signed)
Stable. No complaints of angina.

## 2014-06-03 NOTE — Assessment & Plan Note (Signed)
Patient appears euvolemic. He weighs daily and will take extra Lasix as needed.

## 2014-06-03 NOTE — Assessment & Plan Note (Signed)
Patient brought his own journal of blood pressures. It averages about 140/81.

## 2014-06-03 NOTE — Patient Instructions (Signed)
Your physician wants you to follow-up in: 6 MONTHS WITH DR BERRY You will receive a reminder letter in the mail two months in advance. If you don't receive a letter, please call our office to schedule the follow-up appointment.  

## 2014-06-03 NOTE — Assessment & Plan Note (Signed)
Total cholesterol is 125 triglycerides 222 HDL 23 and LDL 56. We discussed exercising daily and decreasing the amount of carbohydrates that he is eating.

## 2014-06-03 NOTE — Assessment & Plan Note (Signed)
Tobacco cessation discussed 

## 2014-06-03 NOTE — Progress Notes (Signed)
Patient ID: Kevin Watkins, male   DOB: [birth year], 73 y.o.   MRN: [id number]     Date:  06/03/2014   ID:  Kevin Watkins, DOB 05/01/1941, MRN [id number]  PCP:  Ronita Hipps, MD  Primary Cardiologist:  Gwenlyn Found     History of Present Illness: Kevin Watkins is a 73 y.o. male followed by [name] with CAD and PVD. He had an RCA BMS in 2001, Myoview was low risk 5/11. His last echo was Jan 2013- EF 55%. He has PVD and has several interventions. He had an innominatet artery PTA in 5/01 with re do 12/03, Jan '06 as well as RSCA stenting at that time. He has had remote bilateral iliac PTA. He had Rt RA stenting in March '07. He has been fairly stable since. Dopplers in March 2015 showed 60-99% Rt RAS but positive flow. This was unchanged. His B/P has been controlled and SCr have been normal. His Rt SCA stent is occluded but he is not symptomatic. He has moderate bilat ICA disease by doppler Sept 2014.  We saw him in April with lower extremity edema. Apparently he had been advised to liberalize his salt intake based on a low serum sodium level. We advised him to avoid salt, and increased his furosemide as well as decreased his amlodipine.  His weight had improved when he saw Kevin Watkins in June.  Resents today for six month follow-up evaluation.  Patient had CBC and comprehensive metabolic panel in 23/53/6144 all values within normal limits. His total cholesterol was 125 triglycerides 229 HDL 23 and LDL 56.  Patient had carotid and renal Dopplers on 03/14/2014. The right proximal renal artery was not visualized. Left proximal renal artery showed less than 60% stenosis.  Carotid Dopplers were stable.    Patient presents for 6 month evaluation. Reports doing well. He is charting his weight and blood pressures regularly. Systolic blood pressure is averaging 140. Low high limits of 125-161.  His weight today is stable from June. Patient takes extra Lasix as needed.  It is just about eliminated extra salt from his  diet.   Does report some numbness in his feet. He denies nausea, vomiting, fever, chest pain, shortness of breath, orthopnea, dizziness, PND, cough, congestion, abdominal pain, hematochezia, melena, lower extremity edema, claudication.  Wt Readings from Last 3 Encounters:  06/03/14 210 lb (95.255 kg)  12/11/13 211 lb 1.6 oz (95.754 kg)  11/06/13 216 lb (97.977 kg)     Past Medical History  Diagnosis Date  . CAD in native artery 06/2000    BMS to the RCA; known 60% mid dominant RCA stenosis and 50-60% OM 2 branch stenosis normal LV function. Last Myoview May 2011 negative for ischemia.  [name] PAD (peripheral artery disease)     iliac, SCA, Innominate, and renal PTA  . Tobacco abuse   . Dyslipidemia, goal LDL below 70   . Hypertension   . Edema     Current Outpatient Prescriptions  Medication Sig Dispense Refill  . amLODipine (NORVASC) 5 MG tablet Take 5 mg by mouth daily.    [name] aspirin EC 81 MG tablet Take 81 mg by mouth daily.    [name] atenolol (TENORMIN) 50 MG tablet Take 50 mg by mouth daily.     [name] atorvastatin (LIPITOR) 40 MG tablet Take 40 mg by mouth daily at 6 PM.     . benazepril (LOTENSIN) 20 MG tablet Take 20 mg by mouth daily.     . clopidogrel (  PLAVIX) 75 MG tablet Take 75 mg by mouth once.     . folic acid (FOLVITE) 1 MG tablet Take 800 mcg by mouth daily.     . furosemide (LASIX) 40 MG tablet Take 2 tablets (80 mg total) by mouth daily. 90 tablet 3  . Garlic 1017 MG CAPS Take by mouth.    . nitroGLYCERIN (NITROSTAT) 0.4 MG SL tablet Place 0.4 mg under the tongue every 5 (five) minutes as needed for chest pain.    . Omega-3 Fatty Acids (FISH OIL) 300 MG CAPS Take 1,200 mg by mouth daily.     . Polyethylene Glycol 3350 (MIRALAX PO) Take by mouth.    . potassium chloride (K-DUR) 10 MEQ tablet Take 1 tablet by mouth 2 (two) times daily.    [name] pyridOXINE (VITAMIN B-6) 50 MG tablet Take 50 mg by mouth daily.    . ranitidine (ZANTAC) 75 MG tablet Take 75 mg by mouth daily.    [name]  SPIRIVA HANDIHALER 18 MCG inhalation capsule Place 18 mcg into inhaler and inhale daily.     . traZODone (DESYREL) 50 MG tablet Take 50 mg by mouth at bedtime.     . vitamin E (VITAMIN E) 400 UNIT capsule Take 400 Units by mouth daily.    . Wheat Dextrin (BENEFIBER PO) Take by mouth.     No current facility-administered medications for this visit.    Allergies:   No Known Allergies  Social History:  The patient  reports that he has been smoking Cigarettes.  He has been smoking about 1.00 pack per day. He has never used smokeless tobacco. He reports that he drinks about 12.6 oz of alcohol per week. He reports that he does not use illicit drugs.   Family history:   Family History  Problem Relation Age of Onset  . Heart disease Mother     hx CABG  . Heart disease Father     hx of CABG  . Cancer Sister   . Heart attack Brother   . Heart disease Brother     ROS:  Please see the history of present illness.  All other systems reviewed and negative.   PHYSICAL EXAM: VS:  BP 160/60 mmHg  Pulse 62  Ht 5\' 7"  (1.702 m)  Wt 210 lb (95.255 kg)  BMI 32.88 kg/m2 Obese, well developed, in no acute distress HEENT: Pupils are equal round react to light accommodation extraocular movements are intact.  Neck: no JVDNo cervical lymphadenopathy. Bilateral carotid bruits Cardiac: Regular rate and rhythm with 1/6 musical MM at the RSB Lungs:  clear to auscultation bilaterally, no wheezing, rhonchi or rales Abd: soft, nontender, positive bowel sounds all quadrants, no hepatosplenomegaly Ext: no lower extremity edema.  2+ radial and dorsalis pedis pulses. Skin: warm and dry Neuro:  Grossly normal  EKG: Normal sinus rhythm rate 62 bpm  ASSESSMENT AND PLAN:  Problem List Items Addressed This Visit    CAD-RCA BMS 2001, low risk Myoview 2011 - Primary (Chronic)    Stable. No complaints of angina.      Relevant Orders      EKG 12-Lead   Chronic diastolic heart failure    Patient appears  euvolemic. He weighs daily and will take extra Lasix as needed.    Dyslipidemia, goal LDL below 70 (Chronic)    Total cholesterol is 125 triglycerides 222 HDL 23 and LDL 56. We discussed exercising daily and decreasing the amount of carbohydrates that he is eating.  Hypertension (Chronic)    Patient brought his own journal of blood pressures. It averages about 140/81.    PAD- multiple interventions- renal, iliac, SCA (Chronic)    Renal and carotid Dopplers were stable. Will repeat in 6 months    Tobacco abuse (Chronic)    Tobacco cessation discussed.

## 2014-06-03 NOTE — Assessment & Plan Note (Signed)
Renal and carotid Dopplers were stable. Will repeat in 6 months

## 2014-09-02 DIAGNOSIS — H35373 Puckering of macula, bilateral: Secondary | ICD-10-CM | POA: Diagnosis not present

## 2014-09-05 DIAGNOSIS — Z0000 Encounter for general adult medical examination without abnormal findings: Secondary | ICD-10-CM | POA: Diagnosis not present

## 2014-09-05 DIAGNOSIS — Z9181 History of falling: Secondary | ICD-10-CM | POA: Diagnosis not present

## 2014-09-05 DIAGNOSIS — E785 Hyperlipidemia, unspecified: Secondary | ICD-10-CM | POA: Diagnosis not present

## 2014-09-05 DIAGNOSIS — I10 Essential (primary) hypertension: Secondary | ICD-10-CM | POA: Diagnosis not present

## 2014-09-05 DIAGNOSIS — F17200 Nicotine dependence, unspecified, uncomplicated: Secondary | ICD-10-CM | POA: Diagnosis not present

## 2014-09-05 DIAGNOSIS — Z79899 Other long term (current) drug therapy: Secondary | ICD-10-CM | POA: Diagnosis not present

## 2014-09-17 DIAGNOSIS — H35373 Puckering of macula, bilateral: Secondary | ICD-10-CM | POA: Diagnosis not present

## 2014-09-19 ENCOUNTER — Ambulatory Visit (HOSPITAL_BASED_OUTPATIENT_CLINIC_OR_DEPARTMENT_OTHER)
Admission: RE | Admit: 2014-09-19 | Discharge: 2014-09-19 | Disposition: A | Discharge status: Home / Self Care | Payer: Medicare Other | Source: Ambulatory Visit | Attending: Cardiovascular Disease | Admitting: Cardiovascular Disease

## 2014-09-19 ENCOUNTER — Ambulatory Visit (HOSPITAL_COMMUNITY)
Admission: RE | Admit: 2014-09-19 | Discharge: 2014-09-19 | Disposition: A | Discharge status: Home / Self Care | Payer: Medicare Other | Source: Ambulatory Visit | Attending: Internal Medicine | Admitting: Internal Medicine

## 2014-09-19 DIAGNOSIS — I739 Peripheral vascular disease, unspecified: Secondary | ICD-10-CM | POA: Insufficient documentation

## 2014-09-19 NOTE — Progress Notes (Signed)
Renal Artery Duplex Completed. °Brianna L Mazza,RVT °

## 2014-09-19 NOTE — Progress Notes (Signed)
Carotid Duplex Completed. °Brianna L Mazza,RVT °

## 2014-10-02 DIAGNOSIS — H35373 Puckering of macula, bilateral: Secondary | ICD-10-CM | POA: Diagnosis not present

## 2014-10-03 ENCOUNTER — Encounter: Payer: Self-pay | Admitting: Cardiovascular Disease

## 2014-10-03 ENCOUNTER — Ambulatory Visit (INDEPENDENT_AMBULATORY_CARE_PROVIDER_SITE_OTHER): Payer: Medicare Other | Admitting: Cardiovascular Disease

## 2014-10-03 VITALS — BP 156/70 | HR 86 | Ht 67.0 in | Wt 205.4 lb

## 2014-10-03 DIAGNOSIS — Z720 Tobacco use: Secondary | ICD-10-CM

## 2014-10-03 DIAGNOSIS — E785 Hyperlipidemia, unspecified: Secondary | ICD-10-CM

## 2014-10-03 DIAGNOSIS — I2510 Atherosclerotic heart disease of native coronary artery without angina pectoris: Secondary | ICD-10-CM

## 2014-10-03 DIAGNOSIS — I5031 Acute diastolic (congestive) heart failure: Secondary | ICD-10-CM

## 2014-10-03 DIAGNOSIS — I739 Peripheral vascular disease, unspecified: Secondary | ICD-10-CM

## 2014-10-03 DIAGNOSIS — I10 Essential (primary) hypertension: Secondary | ICD-10-CM | POA: Diagnosis not present

## 2014-10-03 NOTE — Assessment & Plan Note (Signed)
History of PAD status post right subclavian and innominate stenting in the past as well as right renal artery stenting. His innominate/subclavian stents have failed with marked blood pressure differential in both arms. He does have a known ostial left common carotid artery stenosis. His most recent renal Dopplers did not visualize distended but did show normal velocities beyond that with a renal dimension of 9.8 cm. His left renal velocities had increased with a renal aortic ratio 5.42. His left renal dimensions were 13 cm. His carotid Dopplers show increase in velocities in his right carotid bulb. We'll continue to follow all these noninvasively.

## 2014-10-03 NOTE — Progress Notes (Signed)
10/03/2014 Kevin Watkins   [birth year]  [id number]  Primary Physician Ronita Hipps, MD Primary Cardiologist: Lorretta Harp MD [name]   HPI:   The patient is a 74 year old mildly overweight married Caucasian male father of 53, grandfather to 2 grandchildren who I last saw in the office 12 months ago. He has a history of CAD and PVOD. I stented his right coronary artery as a 3.5 mm x 12 mm long Medtronic S7 bare metal stent in December of 2001. At that time he did have a 60% mid dominant RCA stenosis as well as a 50-60% OM2 branch stenosis with normal LV function. He had a negative Myoview Nov 28, 2009 and denies chest pain or shortness of breath. I stented his right subclavian and innominate vessel as well as his right renal artery. He has no known ostial left common carotid artery stenosis and left subclavian artery stenosis as well. He is cutting down his cigarettes from 1 pack per day to 1-2 packs per week and is using an Engineer, manufacturing. His other problem include hypertension and hyperlipidemia. We are following his various Doppler studies in our office.he saw Kerin Ransom PA-C in the office one week ago with complaints of dyspnea or lower extremity edema. Apparently he was advised to liberalize his salt intake. We advised him to avoid salt, and increased his furosemide as well as decreased his amlodipine. He lost 34 pounds. His edema has somewhat improved. We have continued to follow his basic metabolic panel and adjust his diuretics as necessary. We've been following his carotid and renal Doppler studies. 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 He also has had progression in his right carotid bulb.   Current Outpatient Prescriptions  Medication Sig Dispense Refill  . amLODipine (NORVASC) 5 MG tablet Take 5 mg by mouth daily.    [name] aspirin EC 81 MG tablet Take 81 mg by mouth daily.    [name] atenolol (TENORMIN) 50 MG  tablet Take 50 mg by mouth daily.     [name] atorvastatin (LIPITOR) 40 MG tablet Take 40 mg by mouth daily at 6 PM.     . benazepril (LOTENSIN) 20 MG tablet Take 20 mg by mouth daily.     . clopidogrel (PLAVIX) 75 MG tablet Take 75 mg by mouth once.     . folic acid (FOLVITE) 1 MG tablet Take 800 mcg by mouth daily.     . furosemide (LASIX) 40 MG tablet Take 2 tablets (80 mg total) by mouth daily. 90 tablet 3  . Garlic 6378 MG CAPS Take by mouth.    . nitroGLYCERIN (NITROSTAT) 0.4 MG SL tablet Place 0.4 mg under the tongue every 5 (five) minutes as needed for chest pain.    . Omega-3 Fatty Acids (FISH OIL) 300 MG CAPS Take 1,200 mg by mouth daily.     . Polyethylene Glycol 3350 (MIRALAX PO) Take by mouth.    . potassium chloride (K-DUR) 10 MEQ tablet Take 1 tablet by mouth 2 (two) times daily.    [name] pyridOXINE (VITAMIN B-6) 50 MG tablet Take 50 mg by mouth daily.    . ranitidine (ZANTAC) 75 MG tablet Take 75 mg by mouth daily.    [name] SPIRIVA HANDIHALER 18 MCG inhalation capsule Place 18 mcg into inhaler and inhale daily.     . traZODone (DESYREL) 50 MG tablet Take 50 mg by mouth at bedtime.     [name]  vitamin E (VITAMIN E) 400 UNIT capsule Take 400 Units by mouth daily.    . Wheat Dextrin (BENEFIBER PO) Take by mouth.     No current facility-administered medications for this visit.    No Known Allergies  History   Social History  . Marital Status: Married    Spouse Name: N/A  . Number of Children: N/A  . Years of Education: N/A   Occupational History  . Not on file.   Social History Main Topics  . Smoking status: Current Every Day Smoker -- 1.00 packs/day    Types: Cigarettes  . Smokeless tobacco: Never Used     Comment: also electronic cigarettes  . Alcohol Use: 12.6 oz/week    21 Cans of beer per week     Comment: beer  . Drug Use: No  . Sexual Activity: Not on file   Other Topics Concern  . Not on file   Social History Narrative     Review of Systems: General: negative for  chills, fever, night sweats or weight changes.  Cardiovascular: negative for chest pain, dyspnea on exertion, edema, orthopnea, palpitations, paroxysmal nocturnal dyspnea or shortness of breath Dermatological: negative for rash Respiratory: negative for cough or wheezing Urologic: negative for hematuria Abdominal: negative for nausea, vomiting, diarrhea, bright red blood per rectum, melena, or hematemesis Neurologic: negative for visual changes, syncope, or dizziness All other systems reviewed and are otherwise negative except as noted above.    Blood pressure 156/70, pulse 86, height 5\' 7"  (1.702 m), weight 205 lb 6.4 oz (93.169 kg).  General appearance: alert and no distress Neck: no adenopathy, no JVD, supple, symmetrical, trachea midline, thyroid not enlarged, symmetric, no tenderness/mass/nodules and I lateral carotid and subclavian bruits Lungs: clear to auscultation bilaterally Heart: regular rate and rhythm, S1, S2 normal, no murmur, click, rub or gallop Extremities: extremities normal, atraumatic, no cyanosis or edema  EKG not performed today  ASSESSMENT AND PLAN:   Tobacco abuse Continued tobacco abuse of 1-1.5 packs per day recalcitrant to risk factor modification   PAD- multiple interventions- renal, iliac, SCA History of PAD status post right subclavian and innominate stenting in the past as well as right renal artery stenting. His innominate/subclavian stents have failed with marked blood pressure differential in both arms. He does have a known ostial left common carotid artery stenosis. His most recent renal Dopplers did not visualize distended but did show normal velocities beyond that with a renal dimension of 9.8 cm. His left renal velocities had increased with a renal aortic ratio 5.42. His left renal dimensions were 13 cm. His carotid Dopplers show increase in velocities in his right carotid bulb. We'll continue to follow all these  noninvasively.   Hypertension History of hypertension with blood pressure measured today in the left arm of 156/70. He is on amlodipine, atenolol, benazepril. Continue current meds at current dosing   Dyslipidemia, goal LDL below 70 History of hyperlipidemia on atorvastatin 40 mg a day with recent blood work performed by his PCP 04/15/14 revealed a total cholesterol 125, LDL 56 and HDL of 23.   CAD-RCA BMS 2001, low risk Myoview 2011 History of CAD status post stenting of his RCA December 2001 by myself with a 3.5 mm x 12 mm long Medtronic S7 bare-metal stent. He has not had issues with his coronary arteries since that time and denies chest pain or shortness of breath.   Acute diastolic CHF (congestive heart failure) History of diastolic heart failure currently stable on salt  restriction and current medications.       Lorretta Harp MD FACP,FACC,FAHA, [facility] 10/03/2014 10:20 AM

## 2014-10-03 NOTE — Assessment & Plan Note (Signed)
Continued tobacco abuse of 1-1.5 packs per day recalcitrant to risk factor modification

## 2014-10-03 NOTE — Assessment & Plan Note (Signed)
History of CAD status post stenting of his RCA December 2001 by myself with a 3.5 mm x 12 mm long Medtronic S7 bare-metal stent. He has not had issues with his coronary arteries since that time and denies chest pain or shortness of breath.

## 2014-10-03 NOTE — Assessment & Plan Note (Signed)
History of diastolic heart failure currently stable on salt restriction and current medications.

## 2014-10-03 NOTE — Assessment & Plan Note (Signed)
History of hypertension with blood pressure measured today in the left arm of 156/70. He is on amlodipine, atenolol, benazepril. Continue current meds at current dosing

## 2014-10-03 NOTE — Patient Instructions (Signed)
We request that you follow-up in: 6 months Gaspar Bidding) with an extender and in 1 year with Dr Andria Rhein will receive a reminder letter in the mail two months in advance. If you don't receive a letter, please call our office to schedule the follow-up appointment.

## 2014-10-03 NOTE — Assessment & Plan Note (Signed)
History of hyperlipidemia on atorvastatin 40 mg a day with recent blood work performed by his PCP 04/15/14 revealed a total cholesterol 125, LDL 56 and HDL of 23.

## 2014-12-04 DIAGNOSIS — H35373 Puckering of macula, bilateral: Secondary | ICD-10-CM | POA: Diagnosis not present

## 2015-02-02 ENCOUNTER — Other Ambulatory Visit: Payer: Self-pay | Admitting: Cardiovascular Disease

## 2015-02-02 DIAGNOSIS — I771 Stricture of artery: Secondary | ICD-10-CM

## 2015-02-12 ENCOUNTER — Inpatient Hospital Stay (HOSPITAL_COMMUNITY): Admission: RE | Admit: 2015-02-12 | Payer: Medicare Other | Source: Ambulatory Visit

## 2015-02-12 ENCOUNTER — Ambulatory Visit (HOSPITAL_COMMUNITY)
Admission: RE | Admit: 2015-02-12 | Discharge: 2015-02-12 | Disposition: A | Discharge status: Home / Self Care | Payer: Medicare Other | Source: Ambulatory Visit | Attending: Cardiovascular Disease | Admitting: Cardiovascular Disease

## 2015-02-12 DIAGNOSIS — I771 Stricture of artery: Secondary | ICD-10-CM | POA: Diagnosis not present

## 2015-02-12 DIAGNOSIS — I6523 Occlusion and stenosis of bilateral carotid arteries: Secondary | ICD-10-CM | POA: Insufficient documentation

## 2015-03-12 DIAGNOSIS — H35373 Puckering of macula, bilateral: Secondary | ICD-10-CM | POA: Diagnosis not present

## 2015-03-20 ENCOUNTER — Other Ambulatory Visit: Payer: Self-pay | Admitting: Cardiovascular Disease

## 2015-03-20 DIAGNOSIS — I701 Atherosclerosis of renal artery: Secondary | ICD-10-CM

## 2015-03-23 ENCOUNTER — Other Ambulatory Visit: Payer: Self-pay | Admitting: Cardiovascular Disease

## 2015-03-23 DIAGNOSIS — I6523 Occlusion and stenosis of bilateral carotid arteries: Secondary | ICD-10-CM

## 2015-03-26 ENCOUNTER — Ambulatory Visit (HOSPITAL_BASED_OUTPATIENT_CLINIC_OR_DEPARTMENT_OTHER)
Admission: RE | Admit: 2015-03-26 | Discharge: 2015-03-26 | Disposition: A | Discharge status: Home / Self Care | Payer: Medicare Other | Source: Ambulatory Visit | Attending: Cardiology | Admitting: Cardiology

## 2015-03-26 ENCOUNTER — Ambulatory Visit (HOSPITAL_COMMUNITY)
Admission: RE | Admit: 2015-03-26 | Discharge: 2015-03-26 | Disposition: A | Discharge status: Home / Self Care | Payer: Medicare Other | Source: Ambulatory Visit | Attending: Cardiology | Admitting: Cardiology

## 2015-03-26 DIAGNOSIS — I6523 Occlusion and stenosis of bilateral carotid arteries: Secondary | ICD-10-CM | POA: Diagnosis not present

## 2015-03-26 DIAGNOSIS — I701 Atherosclerosis of renal artery: Secondary | ICD-10-CM | POA: Diagnosis not present

## 2015-04-07 DIAGNOSIS — Z23 Encounter for immunization: Secondary | ICD-10-CM | POA: Diagnosis not present

## 2015-04-29 DIAGNOSIS — R040 Epistaxis: Secondary | ICD-10-CM | POA: Diagnosis not present

## 2015-04-30 ENCOUNTER — Ambulatory Visit (INDEPENDENT_AMBULATORY_CARE_PROVIDER_SITE_OTHER): Payer: Medicare Other | Admitting: Cardiology

## 2015-04-30 ENCOUNTER — Encounter: Payer: Self-pay | Admitting: Cardiology

## 2015-04-30 VITALS — BP 126/78 | HR 60 | Ht 67.0 in | Wt 206.0 lb

## 2015-04-30 DIAGNOSIS — I5032 Chronic diastolic (congestive) heart failure: Secondary | ICD-10-CM

## 2015-04-30 DIAGNOSIS — R040 Epistaxis: Secondary | ICD-10-CM

## 2015-04-30 DIAGNOSIS — Z720 Tobacco use: Secondary | ICD-10-CM

## 2015-04-30 DIAGNOSIS — I10 Essential (primary) hypertension: Secondary | ICD-10-CM | POA: Diagnosis not present

## 2015-04-30 DIAGNOSIS — I739 Peripheral vascular disease, unspecified: Secondary | ICD-10-CM

## 2015-04-30 DIAGNOSIS — E785 Hyperlipidemia, unspecified: Secondary | ICD-10-CM

## 2015-04-30 NOTE — Assessment & Plan Note (Signed)
History of stent to the right subclavian and innominate vessel, stent to right renal artery.  Dopplers done in Sept unchanged

## 2015-04-30 NOTE — Assessment & Plan Note (Signed)
He has a pack in place after an episode of epistaxis earlier this week.

## 2015-04-30 NOTE — Assessment & Plan Note (Signed)
Stent to the RCA bare-metal stent December 2001, known 60% mid dominant RCA stenosis and 50-60% OM 2 branch stenosis normal LV function. Last Myoview May 2011 negative for ischemia.

## 2015-04-30 NOTE — Assessment & Plan Note (Signed)
Controlled.  

## 2015-04-30 NOTE — Assessment & Plan Note (Signed)
Labs per PCP

## 2015-04-30 NOTE — Patient Instructions (Signed)
Your physician wants you to follow-up in: 6 months or sooner if needed. You will receive a reminder letter in the mail two months in advance. If you don't receive a letter, please call our office to schedule the follow-up appointment. 

## 2015-04-30 NOTE — Progress Notes (Signed)
04/30/2015 Kevin Watkins   [birth year]  [id number]  Primary Physician Ronita Hipps, MD Primary Cardiologist: [name]  HPI:  74 y.o. male followed by [name] with CAD and PVD. He had an RCA BMS in 2001, Myoview was low risk 5/11. His last echo was Jan 2013- EF 55%. He has PVD and has several interventions. He had an innominatet artery PTA in 5/01 with re do 12/03, Jan '06 as well as RSCA stenting at that time. He has had remote bilateral iliac PTA. He had Rt RA stenting in March '07. He has been fairly stable since. Dopplers in March 2015 showed 60-99% Rt RAS but positive flow. This was unchanged. His B/P has been controlled and SCr have been normal. His labs are drawn by his PCP. His Rt SCA stent is occluded but he is not symptomatic. His dopplers in Sept 2016 showed no significant change. He denies any unusual chest pain or dyspnea. He did have an episode of epistaxis earlier thi week and has a posterior pack in place.      Current Outpatient Prescriptions  Medication Sig Dispense Refill  . amLODipine (NORVASC) 5 MG tablet Take 5 mg by mouth daily.    [name] amoxicillin-clavulanate (AUGMENTIN) 875-125 MG tablet Take 1 tablet by mouth 2 (two) times daily.    [name] aspirin EC 81 MG tablet Take 81 mg by mouth daily.    [name] atenolol (TENORMIN) 50 MG tablet Take 50 mg by mouth daily.     [name] atorvastatin (LIPITOR) 40 MG tablet Take 40 mg by mouth daily at 6 PM.     . benazepril (LOTENSIN) 20 MG tablet Take 20 mg by mouth daily.     . clopidogrel (PLAVIX) 75 MG tablet Take 75 mg by mouth once.     . folic acid (FOLVITE) 1 MG tablet Take 800 mcg by mouth daily.     . furosemide (LASIX) 40 MG tablet Take 2 tablets (80 mg total) by mouth daily. 90 tablet 3  . Garlic 7616 MG CAPS Take 1 capsule by mouth daily.     . nitroGLYCERIN (NITROSTAT) 0.4 MG SL tablet Place 0.4 mg under the tongue every 5 (five) minutes as needed for chest pain.    . Omega-3 Fatty Acids (FISH OIL) 300 MG CAPS Take 1,200 mg by  mouth daily.     . Polyethylene Glycol 3350 (MIRALAX PO) Take 17 g by mouth daily.     . potassium chloride (K-DUR) 10 MEQ tablet Take 1 tablet by mouth 2 (two) times daily.    [name] pyridOXINE (VITAMIN B-6) 50 MG tablet Take 50 mg by mouth daily.    . ranitidine (ZANTAC) 75 MG tablet Take 75 mg by mouth daily.    [name] SPIRIVA HANDIHALER 18 MCG inhalation capsule Place 18 mcg into inhaler and inhale daily.     . traZODone (DESYREL) 50 MG tablet Take 50 mg by mouth at bedtime.     . vitamin E (VITAMIN E) 400 UNIT capsule Take 400 Units by mouth daily.    . Wheat Dextrin (BENEFIBER PO) Take by mouth daily. 4 teaspoons     No current facility-administered medications for this visit.    No Known Allergies  Social History   Social History  . Marital Status: Married    Spouse Name: N/A  . Number of Children: N/A  . Years of Education: N/A   Occupational History  . Not on file.   Social History Main Topics  .  Smoking status: Current Every Day Smoker -- 1.00 packs/day    Types: Cigarettes  . Smokeless tobacco: Never Used     Comment: also electronic cigarettes  . Alcohol Use: 12.6 oz/week    21 Cans of beer per week     Comment: beer  . Drug Use: No  . Sexual Activity: Not on file   Other Topics Concern  . Not on file   Social History Narrative     Review of Systems: General: negative for chills, fever, night sweats or weight changes.  Cardiovascular: negative for chest pain, dyspnea on exertion, edema, orthopnea, palpitations, paroxysmal nocturnal dyspnea or shortness of breath Dermatological: negative for rash Respiratory: negative for cough or wheezing Urologic: negative for hematuria Abdominal: negative for nausea, vomiting, diarrhea, bright red blood per rectum, melena, or hematemesis Neurologic: negative for visual changes, syncope, or dizziness All other systems reviewed and are otherwise negative except as noted above.    Blood pressure 126/78, pulse 60, height 5\' 7"   (1.702 m), weight 206 lb (93.441 kg).  General appearance: alert, cooperative, no distress and moderately obese Neck: no JVD and loud bilateral bruits Lungs: clear to auscultation bilaterally Heart: regular rate and rhythm and 2/6 systolic murmur AOV and LSB Abdomen: obese Extremities: tarce LE edema, diminnished distal pulses in LE. He has dimnnished RUE pulse Pulses: see above Skin: Skin color, texture, turgor normal. No rashes or lesions Neurologic: Grossly normal  EKG NSR, SB-60  ASSESSMENT AND PLAN:   CAD-RCA BMS 2001, low risk Myoview 2011 Stent to the RCA bare-metal stent December 2001, known 60% mid dominant RCA stenosis and 50-60% OM 2 branch stenosis normal LV function. Last Myoview May 2011 negative for ischemia.  PAD- multiple interventions- renal, iliac, SCA History of stent to the right subclavian and innominate vessel, stent to right renal artery.  Dopplers done in Sept unchanged  Hypertension Controlled  Chronic diastolic heart failure ([place]) He adjust his Lasix based on wgt  Tobacco abuse He continues to smoke  Dyslipidemia, goal LDL below 70 Labs per PCP  Epistaxis He has a pack in place after an episode of epistaxis earlier this week.    PLAN  I did not change Mr Bontempo's medications. I did tell him he could hold his Plavix for a few days if needed for bleeding. F/U [name] 6 months. Labs to be done next week by PCP and he will ask that they be forwarded to [place].   Kerin Ransom K PA-C 04/30/2015 2:02 PM

## 2015-04-30 NOTE — Assessment & Plan Note (Signed)
He adjust his Lasix based on wgt

## 2015-04-30 NOTE — Assessment & Plan Note (Signed)
He continues to smoke.

## 2015-05-01 DIAGNOSIS — I10 Essential (primary) hypertension: Secondary | ICD-10-CM | POA: Diagnosis not present

## 2015-05-01 DIAGNOSIS — Z7901 Long term (current) use of anticoagulants: Secondary | ICD-10-CM | POA: Diagnosis not present

## 2015-05-01 DIAGNOSIS — R040 Epistaxis: Secondary | ICD-10-CM | POA: Diagnosis not present

## 2015-05-06 DIAGNOSIS — J449 Chronic obstructive pulmonary disease, unspecified: Secondary | ICD-10-CM | POA: Diagnosis not present

## 2015-05-06 DIAGNOSIS — I2510 Atherosclerotic heart disease of native coronary artery without angina pectoris: Secondary | ICD-10-CM | POA: Diagnosis not present

## 2015-05-06 DIAGNOSIS — E785 Hyperlipidemia, unspecified: Secondary | ICD-10-CM | POA: Diagnosis not present

## 2015-05-06 DIAGNOSIS — Z79899 Other long term (current) drug therapy: Secondary | ICD-10-CM | POA: Diagnosis not present

## 2015-05-06 DIAGNOSIS — I10 Essential (primary) hypertension: Secondary | ICD-10-CM | POA: Diagnosis not present

## 2015-05-07 DIAGNOSIS — D649 Anemia, unspecified: Secondary | ICD-10-CM | POA: Diagnosis not present

## 2015-05-07 DIAGNOSIS — Z79899 Other long term (current) drug therapy: Secondary | ICD-10-CM | POA: Diagnosis not present

## 2015-05-11 ENCOUNTER — Encounter: Payer: Self-pay | Admitting: Cardiovascular Disease

## 2015-05-14 DIAGNOSIS — Z1211 Encounter for screening for malignant neoplasm of colon: Secondary | ICD-10-CM | POA: Diagnosis not present

## 2015-05-15 DIAGNOSIS — R040 Epistaxis: Secondary | ICD-10-CM | POA: Diagnosis not present

## 2015-05-29 DIAGNOSIS — R040 Epistaxis: Secondary | ICD-10-CM | POA: Diagnosis not present

## 2015-06-09 DIAGNOSIS — R195 Other fecal abnormalities: Secondary | ICD-10-CM | POA: Diagnosis not present

## 2015-08-10 DIAGNOSIS — Y939 Activity, unspecified: Secondary | ICD-10-CM | POA: Diagnosis not present

## 2015-08-10 DIAGNOSIS — R079 Chest pain, unspecified: Secondary | ICD-10-CM | POA: Diagnosis not present

## 2015-08-10 DIAGNOSIS — S20219A Contusion of unspecified front wall of thorax, initial encounter: Secondary | ICD-10-CM | POA: Diagnosis not present

## 2015-10-05 DIAGNOSIS — K639 Disease of intestine, unspecified: Secondary | ICD-10-CM | POA: Diagnosis not present

## 2015-10-06 DIAGNOSIS — K639 Disease of intestine, unspecified: Secondary | ICD-10-CM | POA: Diagnosis not present

## 2015-10-07 DIAGNOSIS — K639 Disease of intestine, unspecified: Secondary | ICD-10-CM | POA: Diagnosis not present

## 2015-10-08 DIAGNOSIS — K639 Disease of intestine, unspecified: Secondary | ICD-10-CM | POA: Diagnosis not present

## 2015-10-09 DIAGNOSIS — K639 Disease of intestine, unspecified: Secondary | ICD-10-CM | POA: Diagnosis not present

## 2015-10-30 ENCOUNTER — Ambulatory Visit (INDEPENDENT_AMBULATORY_CARE_PROVIDER_SITE_OTHER): Payer: PPO | Admitting: Cardiovascular Disease

## 2015-10-30 ENCOUNTER — Encounter: Payer: Self-pay | Admitting: Cardiovascular Disease

## 2015-10-30 ENCOUNTER — Encounter (HOSPITAL_COMMUNITY): Payer: Self-pay

## 2015-10-30 VITALS — BP 138/50 | HR 50 | Ht 67.0 in | Wt 208.4 lb

## 2015-10-30 DIAGNOSIS — E785 Hyperlipidemia, unspecified: Secondary | ICD-10-CM | POA: Diagnosis not present

## 2015-10-30 DIAGNOSIS — I739 Peripheral vascular disease, unspecified: Secondary | ICD-10-CM | POA: Diagnosis not present

## 2015-10-30 DIAGNOSIS — I10 Essential (primary) hypertension: Secondary | ICD-10-CM

## 2015-10-30 DIAGNOSIS — Z720 Tobacco use: Secondary | ICD-10-CM | POA: Diagnosis not present

## 2015-10-30 DIAGNOSIS — I5032 Chronic diastolic (congestive) heart failure: Secondary | ICD-10-CM

## 2015-10-30 DIAGNOSIS — I779 Disorder of arteries and arterioles, unspecified: Secondary | ICD-10-CM

## 2015-10-30 NOTE — Assessment & Plan Note (Signed)
History of bilateral carotid artery disease with recent Dopplers performed 03/26/15 revealing moderately severe right ICA stenosis and mild left ICA stenosis. This has remained stable and is being followed on a semiannual basis.

## 2015-10-30 NOTE — Assessment & Plan Note (Signed)
History of dyslipidemia on atorvastatin followed by PCP

## 2015-10-30 NOTE — Assessment & Plan Note (Signed)
History of ongoing tobacco abuse currently smoking one to 2 packs per week recalcitrant risk factor modification.

## 2015-10-30 NOTE — Assessment & Plan Note (Signed)
Patient history of CAD status post innominate and right subclavian artery stenting known to be occluded. He has ostial left common carotid artery stenosis and left subclavian artery stenosis as well as renal artery stenosis status post right renal artery stenting in the past. He denies upper or lower extremity claudication. We continue to follow this by duplex ultrasound.

## 2015-10-30 NOTE — Patient Instructions (Signed)

## 2015-10-30 NOTE — Assessment & Plan Note (Signed)
CAD status post S7. Mental stenting of his RCA December 2001.Have a 50-60% OM 2 branch stenosis and normal LV function. His last Myoview 11/28/09 was nonischemic. He denies chest pain or shortness of breath.

## 2015-10-30 NOTE — Assessment & Plan Note (Signed)
History of hypertension blood pressure measures 130/50. He is on amlodipine, atenolol and benazepril. Continue current meds at current dose

## 2015-10-30 NOTE — Progress Notes (Signed)
10/30/2015 Kevin Watkins   1941-03-09  [id number]  Primary Physician Ronita Hipps, MD Primary Cardiologist: Lorretta Harp MD Kevin Watkins   HPI:  The patient is a 75 year old mildly overweight married Caucasian male father of 74, grandfather to 2 grandchildren who I last saw in the office 10/03/14. He has a history of CAD and PVOD. I stented his right coronary artery as a 3.5 mm x 12 mm long Medtronic S7 bare metal stent in December of 2001. At that time he did have a 60% mid dominant RCA stenosis as well as a 50-60% OM2 branch stenosis with normal LV function. He had a negative Myoview Nov 28, 2009 and denies chest pain or shortness of breath. I stented his right subclavian and innominate vessel as well as his right renal artery. He has no known ostial left common carotid artery stenosis and left subclavian artery stenosis as well. He is cutting down his cigarettes from 1 pack per day to 1-2 packs per week and is using an Engineer, manufacturing. His other problem include hypertension and hyperlipidemia. We are following his various Doppler studies in our office.he saw Kerin Ransom PA-C in the office one week ago with complaints of dyspnea or lower extremity edema. Apparently he was advised to liberalize his salt intake. We advised him to avoid salt, and increased his furosemide as well as decreased his amlodipine. He lost 34 pounds. His edema has somewhat improved. We have continued to follow his basic metabolic panel and adjust his diuretics as necessary. We've been following his carotid and renal Doppler studies. 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 His right internal carotid artery stenosis has remained stable in the moderately severe range. He denies chest pain, shortness of breath or claudication.   Current Outpatient Prescriptions  Medication Sig Dispense Refill  . amLODipine (NORVASC) 5 MG tablet Take 5 mg by mouth  daily.    [name] aspirin EC 81 MG tablet Take 81 mg by mouth daily.    [name] atenolol (TENORMIN) 50 MG tablet Take 50 mg by mouth daily.     [name] atorvastatin (LIPITOR) 40 MG tablet Take 40 mg by mouth daily at 6 PM.     . benazepril (LOTENSIN) 20 MG tablet Take 20 mg by mouth daily.     . clopidogrel (PLAVIX) 75 MG tablet Take 75 mg by mouth once.     . folic acid (FOLVITE) 1 MG tablet Take 800 mcg by mouth daily.     . furosemide (LASIX) 40 MG tablet Take 2 tablets (80 mg total) by mouth daily. 90 tablet 3  . Garlic 123XX123 MG CAPS Take 1 capsule by mouth daily.     . nitroGLYCERIN (NITROSTAT) 0.4 MG SL tablet Place 0.4 mg under the tongue every 5 (five) minutes as needed for chest pain.    . Omega-3 Fatty Acids (FISH OIL) 300 MG CAPS Take 1,200 mg by mouth daily.     . Polyethylene Glycol 3350 (MIRALAX PO) Take 17 g by mouth daily.     . potassium chloride (K-DUR) 10 MEQ tablet Take 1 tablet by mouth 2 (two) times daily.    [name] pyridOXINE (VITAMIN B-6) 50 MG tablet Take 50 mg by mouth daily.    . ranitidine (ZANTAC) 75 MG tablet Take 75 mg by mouth daily.    [name] SPIRIVA HANDIHALER 18 MCG inhalation capsule Place 18 mcg into inhaler and inhale daily.     [name]  traZODone (DESYREL) 50 MG tablet Take 50 mg by mouth at bedtime.     . vitamin E (VITAMIN E) 400 UNIT capsule Take 400 Units by mouth daily.    . Wheat Dextrin (BENEFIBER PO) Take by mouth daily. 4 teaspoons     No current facility-administered medications for this visit.    No Known Allergies  Social History   Social History  . Marital Status: Married    Spouse Name: N/A  . Number of Children: N/A  . Years of Education: N/A   Occupational History  . Not on file.   Social History Main Topics  . Smoking status: Current Every Day Smoker -- 1.00 packs/day    Types: Cigarettes  . Smokeless tobacco: Never Used     Comment: also electronic cigarettes  . Alcohol Use: 12.6 oz/week    21 Cans of beer per week     Comment: beer  . Drug Use: No  .  Sexual Activity: Not on file   Other Topics Concern  . Not on file   Social History Narrative     Review of Systems: General: negative for chills, fever, night sweats or weight changes.  Cardiovascular: negative for chest pain, dyspnea on exertion, edema, orthopnea, palpitations, paroxysmal nocturnal dyspnea or shortness of breath Dermatological: negative for rash Respiratory: negative for cough or wheezing Urologic: negative for hematuria Abdominal: negative for nausea, vomiting, diarrhea, bright red blood per rectum, melena, or hematemesis Neurologic: negative for visual changes, syncope, or dizziness All other systems reviewed and are otherwise negative except as noted above.    Blood pressure 138/50, pulse 50, height 5\' 7"  (1.702 m), weight 208 lb 6 oz (94.518 kg).  General appearance: alert and no distress Neck: no adenopathy, no JVD, supple, symmetrical, trachea midline, thyroid not enlarged, symmetric, no tenderness/mass/nodules and bilateral carotid bruits Lungs: clear to auscultation bilaterally Heart: ssoft outflow tract murmur Extremities: extremities normal, atraumatic, no cyanosis or edema  EKG not performed today  ASSESSMENT AND PLAN:   CAD-RCA BMS 2001, low risk Myoview 2011 CAD status post S7. Mental stenting of his RCA December 2001.Have a 50-60% OM 2 branch stenosis and normal LV function. His last Myoview 11/28/09 was nonischemic. He denies chest pain or shortness of breath.  PAD- multiple interventions- renal, iliac, SCA Patient history of CAD status post innominate and right subclavian artery stenting known to be occluded. He has ostial left common carotid artery stenosis and left subclavian artery stenosis as well as renal artery stenosis status post right renal artery stenting in the past. He denies upper or lower extremity claudication. We continue to follow this by duplex ultrasound.  Tobacco abuse History of ongoing tobacco abuse currently smoking one to  2 packs per week recalcitrant risk factor modification.  Dyslipidemia, goal LDL below 70 History of dyslipidemia on atorvastatin followed by PCP  Hypertension History of hypertension blood pressure measures 130/50. He is on amlodipine, atenolol and benazepril. Continue current meds at current dose  Chronic diastolic heart failure ([place]) History of diastolic heart failure with preserved LV function. He is aware of salt restriction and is on an oral diuretic. His weight has remained stable and he has minimal peripheral edema.  Bilateral carotid artery disease (HCC) History of bilateral carotid artery disease with recent Dopplers performed 03/26/15 revealing moderately severe right ICA stenosis and mild left ICA stenosis. This has remained stable and is being followed on a semiannual basis.      Lorretta Harp MD FACP,FACC,FAHA, [facility] 10/30/2015 8:07  AM

## 2015-10-30 NOTE — Assessment & Plan Note (Signed)
History of diastolic heart failure with preserved LV function. He is aware of salt restriction and is on an oral diuretic. His weight has remained stable and he has minimal peripheral edema.

## 2015-11-03 ENCOUNTER — Other Ambulatory Visit: Payer: Self-pay | Admitting: Cardiovascular Disease

## 2015-11-03 DIAGNOSIS — I739 Peripheral vascular disease, unspecified: Secondary | ICD-10-CM

## 2015-11-03 DIAGNOSIS — I5032 Chronic diastolic (congestive) heart failure: Secondary | ICD-10-CM

## 2015-11-03 DIAGNOSIS — I10 Essential (primary) hypertension: Secondary | ICD-10-CM

## 2015-11-03 DIAGNOSIS — Z720 Tobacco use: Secondary | ICD-10-CM

## 2015-11-03 DIAGNOSIS — E785 Hyperlipidemia, unspecified: Secondary | ICD-10-CM

## 2015-11-11 ENCOUNTER — Ambulatory Visit (HOSPITAL_COMMUNITY)
Admission: RE | Admit: 2015-11-11 | Discharge: 2015-11-11 | Disposition: A | Discharge status: Home / Self Care | Payer: PPO | Source: Ambulatory Visit | Attending: Cardiovascular Disease | Admitting: Cardiovascular Disease

## 2015-11-11 ENCOUNTER — Ambulatory Visit (HOSPITAL_COMMUNITY): Admission: RE | Admit: 2015-11-11 | Payer: PPO | Source: Ambulatory Visit

## 2015-11-11 DIAGNOSIS — I779 Disorder of arteries and arterioles, unspecified: Secondary | ICD-10-CM | POA: Diagnosis not present

## 2015-11-11 DIAGNOSIS — I110 Hypertensive heart disease with heart failure: Secondary | ICD-10-CM | POA: Diagnosis not present

## 2015-11-11 DIAGNOSIS — E785 Hyperlipidemia, unspecified: Secondary | ICD-10-CM | POA: Diagnosis not present

## 2015-11-11 DIAGNOSIS — I5032 Chronic diastolic (congestive) heart failure: Secondary | ICD-10-CM | POA: Diagnosis not present

## 2015-11-11 DIAGNOSIS — I739 Peripheral vascular disease, unspecified: Secondary | ICD-10-CM | POA: Diagnosis not present

## 2015-11-11 DIAGNOSIS — Z720 Tobacco use: Secondary | ICD-10-CM | POA: Insufficient documentation

## 2015-11-11 DIAGNOSIS — I6523 Occlusion and stenosis of bilateral carotid arteries: Secondary | ICD-10-CM | POA: Insufficient documentation

## 2015-11-11 DIAGNOSIS — I10 Essential (primary) hypertension: Secondary | ICD-10-CM

## 2015-11-12 ENCOUNTER — Telehealth: Payer: Self-pay

## 2015-11-12 DIAGNOSIS — I739 Peripheral vascular disease, unspecified: Principal | ICD-10-CM

## 2015-11-12 DIAGNOSIS — I779 Disorder of arteries and arterioles, unspecified: Secondary | ICD-10-CM

## 2015-11-12 NOTE — Telephone Encounter (Signed)
-----   Message from Lorretta Harp, MD sent at 11/11/2015  4:16 PM EDT ----- No change from prior study. Repeat in 12 months.

## 2015-12-11 DIAGNOSIS — J449 Chronic obstructive pulmonary disease, unspecified: Secondary | ICD-10-CM | POA: Diagnosis not present

## 2015-12-11 DIAGNOSIS — Z720 Tobacco use: Secondary | ICD-10-CM | POA: Diagnosis not present

## 2015-12-11 DIAGNOSIS — Z0000 Encounter for general adult medical examination without abnormal findings: Secondary | ICD-10-CM | POA: Diagnosis not present

## 2015-12-11 DIAGNOSIS — I119 Hypertensive heart disease without heart failure: Secondary | ICD-10-CM | POA: Diagnosis not present

## 2015-12-11 DIAGNOSIS — G4700 Insomnia, unspecified: Secondary | ICD-10-CM | POA: Diagnosis not present

## 2016-01-27 DIAGNOSIS — Z79899 Other long term (current) drug therapy: Secondary | ICD-10-CM | POA: Diagnosis not present

## 2016-01-27 DIAGNOSIS — E785 Hyperlipidemia, unspecified: Secondary | ICD-10-CM | POA: Diagnosis not present

## 2016-01-27 DIAGNOSIS — J449 Chronic obstructive pulmonary disease, unspecified: Secondary | ICD-10-CM | POA: Diagnosis not present

## 2016-01-27 DIAGNOSIS — I119 Hypertensive heart disease without heart failure: Secondary | ICD-10-CM | POA: Diagnosis not present

## 2016-01-27 DIAGNOSIS — G4700 Insomnia, unspecified: Secondary | ICD-10-CM | POA: Diagnosis not present

## 2016-03-09 DIAGNOSIS — H35373 Puckering of macula, bilateral: Secondary | ICD-10-CM | POA: Diagnosis not present

## 2016-03-09 DIAGNOSIS — H26492 Other secondary cataract, left eye: Secondary | ICD-10-CM | POA: Diagnosis not present

## 2016-03-09 DIAGNOSIS — H52223 Regular astigmatism, bilateral: Secondary | ICD-10-CM | POA: Diagnosis not present

## 2016-03-23 DIAGNOSIS — H26492 Other secondary cataract, left eye: Secondary | ICD-10-CM | POA: Diagnosis not present

## 2016-03-23 DIAGNOSIS — H35373 Puckering of macula, bilateral: Secondary | ICD-10-CM | POA: Diagnosis not present

## 2016-03-23 DIAGNOSIS — Z961 Presence of intraocular lens: Secondary | ICD-10-CM | POA: Diagnosis not present

## 2016-03-30 DIAGNOSIS — Z23 Encounter for immunization: Secondary | ICD-10-CM | POA: Diagnosis not present

## 2016-05-03 ENCOUNTER — Ambulatory Visit (INDEPENDENT_AMBULATORY_CARE_PROVIDER_SITE_OTHER): Payer: PPO | Admitting: Cardiovascular Disease

## 2016-05-03 ENCOUNTER — Other Ambulatory Visit: Payer: Self-pay | Admitting: Cardiovascular Disease

## 2016-05-03 ENCOUNTER — Encounter: Payer: Self-pay | Admitting: Cardiovascular Disease

## 2016-05-03 VITALS — BP 128/57 | HR 60 | Ht 67.0 in | Wt 208.1 lb

## 2016-05-03 DIAGNOSIS — R600 Localized edema: Secondary | ICD-10-CM

## 2016-05-03 DIAGNOSIS — I5032 Chronic diastolic (congestive) heart failure: Secondary | ICD-10-CM

## 2016-05-03 DIAGNOSIS — I739 Peripheral vascular disease, unspecified: Secondary | ICD-10-CM

## 2016-05-03 DIAGNOSIS — I10 Essential (primary) hypertension: Secondary | ICD-10-CM

## 2016-05-03 DIAGNOSIS — I2510 Atherosclerotic heart disease of native coronary artery without angina pectoris: Secondary | ICD-10-CM

## 2016-05-03 NOTE — Assessment & Plan Note (Signed)
History of hypertension blood pressure measured 120/57. He is on atenolol, amlodipine and benazepril. Continue current meds at current dosing

## 2016-05-03 NOTE — Assessment & Plan Note (Signed)
History of dyslipidemia on statin therapy followed by his PCP 

## 2016-05-03 NOTE — Assessment & Plan Note (Signed)
History of carotid artery disease with recent Dopplers performed 11/11/15 revealing moderately high-grade right ICA stenosis with an occluded dominant artery is also patent artery. His right upper extremity blood pressures 80 with a left of 122 he has moderate left ICA stenosis. Will continue to follow him by duplex ultrasound.

## 2016-05-03 NOTE — Progress Notes (Signed)
05/03/2016 Kevin Watkins   September 07, 1940  [id number]  Primary Physician Ronita Hipps, MD Primary Cardiologist: Lorretta Harp MD [name]  HPI:  The patient is a 75 year old mildly overweight married Caucasian male father of 89, grandfather to 2 grandchildren who I last saw in the office 10/30/15. He has a history of CAD and PVOD. I stented his right coronary artery as a 3.5 mm x 12 mm long Medtronic S7 bare metal stent in December of 2001. At that time he did have a 60% mid dominant RCA stenosis as well as a 50-60% OM2 branch stenosis with normal LV function. He had a negative Myoview Nov 28, 2009 and denies chest pain or shortness of breath. I stented his right subclavian and innominate vessel as well as his right renal artery. He has no known ostial left common carotid artery stenosis and left subclavian artery stenosis as well. He is cutting down his cigarettes from 1 pack per day to 1-2 packs per week and is using an Engineer, manufacturing. His other problem include hypertension and hyperlipidemia. We are following his various Doppler studies in our office.he saw Kerin Ransom PA-C in the office one week ago with complaints of dyspnea or lower extremity edema. Apparently he was advised to liberalize his salt intake. We advised him to avoid salt, and increased his furosemide as well as decreased his amlodipine. He lost 34 pounds. His edema has somewhat improved. We have continued to follow his basic metabolic panel and adjust his diuretics as necessary. He does weigh himself on a daily basis and adjust his diuretics by doubling them for 2-3 days if his weight begins to increase. We've been following his carotid and renal Doppler studies. 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 His right internal carotid artery stenosis has remained stable in the moderately severe range. He denies chest pain, shortness of breath or  claudication.   Current Outpatient Prescriptions  Medication Sig Dispense Refill  . amLODipine (NORVASC) 5 MG tablet Take 5 mg by mouth daily.    [name] aspirin EC 81 MG tablet Take 81 mg by mouth daily.    [name] atenolol (TENORMIN) 50 MG tablet Take 50 mg by mouth daily.     [name] atorvastatin (LIPITOR) 40 MG tablet Take 40 mg by mouth daily at 6 PM.     . benazepril (LOTENSIN) 20 MG tablet Take 20 mg by mouth daily.     . clopidogrel (PLAVIX) 75 MG tablet Take 75 mg by mouth once.     . folic acid (FOLVITE) 1 MG tablet Take 800 mcg by mouth daily.     . furosemide (LASIX) 40 MG tablet Take 2 tablets (80 mg total) by mouth daily. 90 tablet 3  . Garlic 123XX123 MG CAPS Take 1 capsule by mouth daily.     . nitroGLYCERIN (NITROSTAT) 0.4 MG SL tablet Place 0.4 mg under the tongue every 5 (five) minutes as needed for chest pain.    . Omega-3 Fatty Acids (FISH OIL) 300 MG CAPS Take 1,200 mg by mouth daily.     . Polyethylene Glycol 3350 (MIRALAX PO) Take 17 g by mouth daily.     . potassium chloride (K-DUR) 10 MEQ tablet Take 1 tablet by mouth 2 (two) times daily.    . potassium chloride (K-DUR,KLOR-CON) 10 MEQ tablet Take 2 tablets by mouth daily.    [name] pyridOXINE (VITAMIN B-6) 50 MG  tablet Take 50 mg by mouth daily.    . ranitidine (ZANTAC) 75 MG tablet Take 75 mg by mouth daily.    [name] SPIRIVA HANDIHALER 18 MCG inhalation capsule Place 18 mcg into inhaler and inhale daily.     . vitamin E (VITAMIN E) 400 UNIT capsule Take 400 Units by mouth daily.    . Wheat Dextrin (BENEFIBER PO) Take by mouth daily. 4 teaspoons    . zolpidem (AMBIEN) 10 MG tablet Take 10 mg by mouth at bedtime as needed for sleep.     No current facility-administered medications for this visit.     No Known Allergies  Social History   Social History  . Marital status: Married    Spouse name: N/A  . Number of children: N/A  . Years of education: N/A   Occupational History  . Not on file.   Social History Main Topics  . Smoking  status: Current Every Day Smoker    Packs/day: 1.00    Types: Cigarettes  . Smokeless tobacco: Never Used     Comment: also electronic cigarettes  . Alcohol use 12.6 oz/week    21 Cans of beer per week     Comment: beer  . Drug use: No  . Sexual activity: Not on file   Other Topics Concern  . Not on file   Social History Narrative  . No narrative on file     Review of Systems: General: negative for chills, fever, night sweats or weight changes.  Cardiovascular: negative for chest pain, dyspnea on exertion, edema, orthopnea, palpitations, paroxysmal nocturnal dyspnea or shortness of breath Dermatological: negative for rash Respiratory: negative for cough or wheezing Urologic: negative for hematuria Abdominal: negative for nausea, vomiting, diarrhea, bright red blood per rectum, melena, or hematemesis Neurologic: negative for visual changes, syncope, or dizziness All other systems reviewed and are otherwise negative except as noted above.    Blood pressure (!) 128/57, pulse 60, height 5\' 7"  (1.702 m), weight 208 lb 1.6 oz (94.4 kg).  General appearance: alert and no distress Neck: no adenopathy, no JVD, supple, symmetrical, trachea midline, thyroid not enlarged, symmetric, no tenderness/mass/nodules and Bilateral carotid and subclavian bruits Lungs: clear to auscultation bilaterally Heart: regular rate and rhythm, S1, S2 normal, no murmur, click, rub or gallop Extremities: 2+ right lower extremity edema, 1+ left lower extremity edema  EKG normal sinus rhythm at 60 with nonspecific ST and T-wave changes. I personally reviewed this EKG  ASSESSMENT AND PLAN:   CAD-RCA BMS 2001, low risk Myoview 2011 History of coronary artery disease status post stent right coronary artery stenting by myself December 2001 with a bare metal stent (3.5 mm x 12 mm long Medtronic S7). At that time he did have a 60% mid dominant RCA stenosis as well as a 50% OM 2 branch stenosis with normal LV  function. Given negative Myoview 11/28/09 and denies chest pain.  PAD- multiple interventions- renal, iliac, SCA History of multiple stents including right subclavian, right renal and iliac stenting. We've been following him by duplex ultrasound. He does admit to some claudication.  Tobacco abuse History of continued tobacco abuse of one pack per day recalcitrant risk factor modification  Dyslipidemia, goal LDL below 70 History of dyslipidemia on statin therapy followed by his PCP  Hypertension History of hypertension blood pressure measured 120/57. He is on atenolol, amlodipine and benazepril. Continue current meds at current dosing  Bilateral leg edema History of bilateral lower extremity edema right greater than left.  He is on oral diuretics and is aware of salt restriction. He does weigh himself daily and adjust his diuretics appropriately when he thinks he is volume overloaded by doubling his Lasix for 2 or 3 days. This appears to be effective. I am going to get a 2-D echocardiogram for LV function.  Bilateral carotid artery disease (HCC) History of carotid artery disease with recent Dopplers performed 11/11/15 revealing moderately high-grade right ICA stenosis with an occluded dominant artery is also patent artery. His right upper extremity blood pressures 80 with a left of 122 he has moderate left ICA stenosis. Will continue to follow him by duplex ultrasound.      Lorretta Harp MD FACP,FACC,FAHA, [facility] 05/03/2016 8:13 AM

## 2016-05-03 NOTE — Assessment & Plan Note (Signed)
History of coronary artery disease status post stent right coronary artery stenting by myself December 2001 with a bare metal stent (3.5 mm x 12 mm long Medtronic S7). At that time he did have a 60% mid dominant RCA stenosis as well as a 50% OM 2 branch stenosis with normal LV function. Given negative Myoview 11/28/09 and denies chest pain.

## 2016-05-03 NOTE — Assessment & Plan Note (Signed)
History of continued tobacco abuse of one pack per day recalcitrant risk factor modification 

## 2016-05-03 NOTE — Assessment & Plan Note (Signed)
History of multiple stents including right subclavian, right renal and iliac stenting. We've been following him by duplex ultrasound. He does admit to some claudication.

## 2016-05-03 NOTE — Patient Instructions (Signed)
Medication Instructions:  Continue current medications.   Labwork: Labwork will be requested from your primary care physician.   Testing/Procedures: Your physician has requested that you have an echocardiogram. Echocardiography is a painless test that uses sound waves to create images of your heart. It provides your doctor with information about the size and shape of your heart and how well your heart's chambers and valves are working. This procedure takes approximately one hour. There are no restrictions for this procedure.  Your physician has requested that you have a renal artery duplex. During this test, an ultrasound is used to evaluate blood flow to the kidneys. Allow one hour for this exam. Do not eat after midnight the day before and avoid carbonated beverages. Take your medications as you usually do.  Your physician has requested that you have a AORTA/ILIAC duplex. During this test, an ultrasound is used to evaluate blood flow to the AORTA AND ILIAC ARTERIES. Allow one hour for this exam. Do not eat after midnight the day before and avoid carbonated beverages. Take your medications as you usually do.  Your physician has requested that you have a carotid duplex. This test is an ultrasound of the carotid arteries in your neck. It looks at blood flow through these arteries that supply the brain with blood. Allow one hour for this exam. There are no restrictions or special instructions.  DUE IN MAY 2018.    Follow-Up: We request that you follow-up in: [place] and in 12 MONTHS with Dr Andria Rhein will receive a reminder letter in the mail two months in advance. If you don't receive a letter, please call our office to schedule the follow-up appointment.    Any Other Special Instructions Will Be Listed Below (If Applicable).  When people take your blood pressure, [name] has asked for them to take it only on your LEFT ARM for the best accuracy in the reading.   If  you need a refill on your cardiac medications before your next appointment, please call your pharmacy.

## 2016-05-03 NOTE — Assessment & Plan Note (Signed)
History of bilateral lower extremity edema right greater than left. He is on oral diuretics and is aware of salt restriction. He does weigh himself daily and adjust his diuretics appropriately when he thinks he is volume overloaded by doubling his Lasix for 2 or 3 days. This appears to be effective. I am going to get a 2-D echocardiogram for LV function.

## 2016-05-12 DIAGNOSIS — R0602 Shortness of breath: Secondary | ICD-10-CM | POA: Diagnosis not present

## 2016-05-12 DIAGNOSIS — I509 Heart failure, unspecified: Secondary | ICD-10-CM | POA: Diagnosis not present

## 2016-05-18 DIAGNOSIS — J189 Pneumonia, unspecified organism: Secondary | ICD-10-CM | POA: Diagnosis not present

## 2016-05-24 ENCOUNTER — Ambulatory Visit (HOSPITAL_COMMUNITY): Payer: PPO | Attending: Cardiovascular Disease

## 2016-05-24 ENCOUNTER — Other Ambulatory Visit: Payer: Self-pay

## 2016-05-24 DIAGNOSIS — I2510 Atherosclerotic heart disease of native coronary artery without angina pectoris: Secondary | ICD-10-CM

## 2016-05-24 DIAGNOSIS — I5032 Chronic diastolic (congestive) heart failure: Secondary | ICD-10-CM

## 2016-05-24 DIAGNOSIS — I739 Peripheral vascular disease, unspecified: Secondary | ICD-10-CM

## 2016-05-24 DIAGNOSIS — R600 Localized edema: Secondary | ICD-10-CM | POA: Diagnosis not present

## 2016-05-24 DIAGNOSIS — I351 Nonrheumatic aortic (valve) insufficiency: Secondary | ICD-10-CM | POA: Diagnosis not present

## 2016-05-24 DIAGNOSIS — I110 Hypertensive heart disease with heart failure: Secondary | ICD-10-CM | POA: Insufficient documentation

## 2016-05-24 DIAGNOSIS — I10 Essential (primary) hypertension: Secondary | ICD-10-CM

## 2016-05-27 ENCOUNTER — Inpatient Hospital Stay (HOSPITAL_COMMUNITY): Admission: RE | Admit: 2016-05-27 | Payer: PPO | Source: Ambulatory Visit

## 2016-05-27 ENCOUNTER — Ambulatory Visit (HOSPITAL_COMMUNITY)
Admission: RE | Admit: 2016-05-27 | Discharge: 2016-05-27 | Disposition: A | Discharge status: Home / Self Care | Payer: PPO | Source: Ambulatory Visit | Attending: Cardiovascular Disease | Admitting: Cardiovascular Disease

## 2016-05-27 ENCOUNTER — Other Ambulatory Visit: Payer: Self-pay | Admitting: Cardiovascular Disease

## 2016-05-27 DIAGNOSIS — I2510 Atherosclerotic heart disease of native coronary artery without angina pectoris: Secondary | ICD-10-CM

## 2016-05-27 DIAGNOSIS — I10 Essential (primary) hypertension: Secondary | ICD-10-CM

## 2016-05-27 DIAGNOSIS — I771 Stricture of artery: Secondary | ICD-10-CM | POA: Diagnosis not present

## 2016-05-27 DIAGNOSIS — I701 Atherosclerosis of renal artery: Secondary | ICD-10-CM | POA: Insufficient documentation

## 2016-05-27 DIAGNOSIS — I739 Peripheral vascular disease, unspecified: Secondary | ICD-10-CM | POA: Diagnosis not present

## 2016-05-27 DIAGNOSIS — I5032 Chronic diastolic (congestive) heart failure: Secondary | ICD-10-CM

## 2016-05-28 DIAGNOSIS — I959 Hypotension, unspecified: Secondary | ICD-10-CM | POA: Diagnosis not present

## 2016-05-30 ENCOUNTER — Telehealth: Payer: Self-pay | Admitting: Cardiovascular Disease

## 2016-05-30 ENCOUNTER — Ambulatory Visit (INDEPENDENT_AMBULATORY_CARE_PROVIDER_SITE_OTHER): Payer: PPO | Admitting: Cardiology

## 2016-05-30 ENCOUNTER — Encounter: Payer: Self-pay | Admitting: Cardiology

## 2016-05-30 ENCOUNTER — Other Ambulatory Visit: Payer: Self-pay | Admitting: Cardiovascular Disease

## 2016-05-30 VITALS — BP 115/68 | HR 70 | Ht 67.0 in | Wt 197.4 lb

## 2016-05-30 DIAGNOSIS — R600 Localized edema: Secondary | ICD-10-CM

## 2016-05-30 DIAGNOSIS — I739 Peripheral vascular disease, unspecified: Secondary | ICD-10-CM

## 2016-05-30 DIAGNOSIS — I2510 Atherosclerotic heart disease of native coronary artery without angina pectoris: Secondary | ICD-10-CM

## 2016-05-30 DIAGNOSIS — I10 Essential (primary) hypertension: Secondary | ICD-10-CM | POA: Diagnosis not present

## 2016-05-30 DIAGNOSIS — Z720 Tobacco use: Secondary | ICD-10-CM | POA: Diagnosis not present

## 2016-05-30 DIAGNOSIS — I779 Disorder of arteries and arterioles, unspecified: Secondary | ICD-10-CM

## 2016-05-30 DIAGNOSIS — I5032 Chronic diastolic (congestive) heart failure: Secondary | ICD-10-CM | POA: Diagnosis not present

## 2016-05-30 DIAGNOSIS — E785 Hyperlipidemia, unspecified: Secondary | ICD-10-CM | POA: Diagnosis not present

## 2016-05-30 DIAGNOSIS — I6523 Occlusion and stenosis of bilateral carotid arteries: Secondary | ICD-10-CM

## 2016-05-30 DIAGNOSIS — I480 Paroxysmal atrial fibrillation: Secondary | ICD-10-CM | POA: Insufficient documentation

## 2016-05-30 DIAGNOSIS — I4891 Unspecified atrial fibrillation: Secondary | ICD-10-CM

## 2016-05-30 MED ORDER — ATENOLOL 50 MG PO TABS: 50.0000 mg | ORAL_TABLET | Freq: Every day | ORAL | 5 refills | Status: DC

## 2016-05-30 MED ORDER — FUROSEMIDE 40 MG PO TABS: 80.0000 mg | ORAL_TABLET | Freq: Every day | ORAL | 3 refills | Status: DC

## 2016-05-30 MED ORDER — APIXABAN 5 MG PO TABS: 5.0000 mg | ORAL_TABLET | Freq: Two times a day (BID) | ORAL | 5 refills | Status: DC

## 2016-05-30 NOTE — Progress Notes (Addendum)
06/01/2016 Kevin Watkins   1941-02-15  [id number]  Primary Physician Ronita Hipps, MD Primary Cardiologist: [name]  HPI:  75 year old mildly overweight male with a history of CAD and PVOD. [name] stented his right coronary artery with a bare metal stent in December of 2001. At that time he did have a 60% mid dominant RCA stenosis as well as a 50-60% OM2 branch stenosis with normal LV function. He had a negative Myoview Nov 28, 2009 and denies chest pain or shortness of breath. [name] stented his right subclavian and innominate vessel as well as his right renal artery. He has no known ostial left common carotid artery stenosis and left subclavian artery stenosis as well. He just saw [name] in the office 05/03/16. After that he went to an Urgent Care for LE edema. His diuretics were doubled. The pt actually weighs himself three times a day- once naked, once with clothes, and once after breakfast. He brought in a log that showed these to be stable.  A CXR at the Urgent Care suggested possible CAP and he was placed on ABs as well. He then noted orthostatic dizziness. On f/u this past weekend his Atenolol and Lasix  Were stopped secondary to low B/P. In the office today his B/P by me in his Lt arm was 142/82 (he has RSCA stenosis). His EKG shows AF- new for him.    Current Outpatient Prescriptions  Medication Sig Dispense Refill  . amLODipine (NORVASC) 5 MG tablet Take 5 mg by mouth daily.    [name] aspirin EC 81 MG tablet Take 81 mg by mouth daily.    [name] atenolol (TENORMIN) 50 MG tablet Take 1 tablet (50 mg total) by mouth daily. 30 tablet 5  . atorvastatin (LIPITOR) 40 MG tablet Take 40 mg by mouth daily at 6 PM.     . benazepril (LOTENSIN) 20 MG tablet Take 20 mg by mouth daily.     . folic acid (FOLVITE) 1 MG tablet Take 800 mcg by mouth daily.     . furosemide (LASIX) 40 MG tablet Take 2 tablets (80 mg total) by mouth daily. 90 tablet 3  . Garlic 123XX123 MG CAPS Take 1 capsule by mouth daily.      . nitroGLYCERIN (NITROSTAT) 0.4 MG SL tablet Place 0.4 mg under the tongue every 5 (five) minutes as needed for chest pain.    . Omega-3 Fatty Acids (FISH OIL) 300 MG CAPS Take 1,200 mg by mouth daily.     . Polyethylene Glycol 3350 (MIRALAX PO) Take 17 g by mouth daily.     . potassium chloride (K-DUR) 10 MEQ tablet Take 1 tablet by mouth 2 (two) times daily.    . potassium chloride (K-DUR,KLOR-CON) 10 MEQ tablet Take 2 tablets by mouth daily.    [name] PROAIR HFA 108 (90 Base) MCG/ACT inhaler Inhale 2 puffs into the lungs every 6 (six) hours as needed.    . pyridOXINE (VITAMIN B-6) 50 MG tablet Take 50 mg by mouth daily.    . ranitidine (ZANTAC) 75 MG tablet Take 75 mg by mouth daily.    [name] SPIRIVA HANDIHALER 18 MCG inhalation capsule Place 18 mcg into inhaler and inhale daily.     . vitamin E (VITAMIN E) 400 UNIT capsule Take 400 Units by mouth daily.    . Wheat Dextrin (BENEFIBER PO) Take by mouth daily. 4 teaspoons    . zolpidem (AMBIEN) 10 MG tablet Take 10 mg by mouth at  bedtime as needed for sleep.    [name] apixaban (ELIQUIS) 5 MG TABS tablet Take 1 tablet (5 mg total) by mouth 2 (two) times daily. 60 tablet 5   No current facility-administered medications for this visit.     No Known Allergies  Social History   Social History  . Marital status: Married    Spouse name: N/A  . Number of children: N/A  . Years of education: N/A   Occupational History  . Not on file.   Social History Main Topics  . Smoking status: Current Every Day Smoker    Packs/day: 1.00    Types: Cigarettes  . Smokeless tobacco: Never Used     Comment: also electronic cigarettes  . Alcohol use 12.6 oz/week    21 Cans of beer per week     Comment: beer  . Drug use: No  . Sexual activity: Not on file   Other Topics Concern  . Not on file   Social History Narrative  . No narrative on file     Review of Systems: General: negative for chills, fever, night sweats or weight changes.  Cardiovascular:  negative for chest pain, dyspnea on exertion, edema, orthopnea, palpitations, paroxysmal nocturnal dyspnea or shortness of breath Dermatological: negative for rash Respiratory: negative for cough or wheezing Urologic: negative for hematuria Abdominal: negative for nausea, vomiting, diarrhea, bright red blood per rectum, melena, or hematemesis Neurologic: negative for visual changes, syncope, or dizziness All other systems reviewed and are otherwise negative except as noted above.    Blood pressure 115/68, pulse 70, height 5\' 7"  (1.702 m), weight 197 lb 6.4 oz (89.5 kg).  General appearance: alert, cooperative, no distress and moderately obese Neck: no JVD Lungs: decreased breath sounds Heart: irregularly irregular rhythm Extremities: trace edema Skin: Skin color, texture, turgor normal. No rashes or lesions Neurologic: Grossly normal  EKG AF with VR 86  ASSESSMENT AND PLAN:   Essential hypertension Labile B/P recently after diuretics were increased and then his medication decreased at follow up after he was seen at an Urgent Care and his B/P was "low"  Chronic diastolic heart failure (HCC) EF 55-60% with moderate LVH by echo 05/24/16  PAD- multiple interventions- renal, iliac, SCA  History of stent to the right subclavian and innominate vessel, stent to right renal artery.   CAD-RCA BMS 2001, low risk Myoview 2011 Stent to the RCA bare-metal stent December 2001, known 60% mid dominant RCA stenosis and 50-60% OM 2 branch stenosis normal LV function. Last Myoview May 2011 negative for ischemia.  Bilateral leg edema Chronic LE edema  Tobacco abuse 1 PPD  Atrial fibrillation, new onset ([place]) He is asymptomatic. CHADS VASc=4   PLAN  I added Eliquis 5 mg BID and stopped his Plavix. I suggested her resume his Atenolol 50 mg and Lasix 40 mg. Check labs today. F/U [name] in 4 weeks.   Kerin Ransom PA-C 06/01/2016 10:26 AM

## 2016-05-30 NOTE — Assessment & Plan Note (Signed)
He is asymptomatic. CHADS VASc=4

## 2016-05-30 NOTE — Telephone Encounter (Signed)
Returned call. Pt expressed concern for BP changes - noted his pressure has been running low over the weekend. He was experiencing symptomatic drops when standing.  Evaluated in urgent care over weekend, they had patient cut atenolol and lasix & requested he follow up with cardiology this week. Appt made this afternoon to see [facility] @ 2:30pm. Pt aware of appt info and voiced thanks.

## 2016-05-30 NOTE — Patient Instructions (Addendum)
Medication Instructions:  Your physician has recommended you make the following change in your medication:  1.  STOP the Plavix 2.  START Eliquis 5 mg taking 1 tablet by mouth twice a day 3.  RESUME the Atenolol 50 mg taking 1 tablet daily 4.  RESUME the Lasix 40 mg taking 1 tablet daily   Labwork: TODAY:  BMET, CBC, TSH, & MAG  Testing/Procedures: None ordered  Follow-Up: Your physician recommends that you schedule a follow-up appointment in: [place]   Any Other Special Instructions Will Be Listed Below (If Applicable).     If you need a refill on your cardiac medications before your next appointment, please call your pharmacy.

## 2016-05-30 NOTE — Assessment & Plan Note (Signed)
Stent to the RCA bare-metal stent December 2001, known 60% mid dominant RCA stenosis and 50-60% OM 2 branch stenosis normal LV function. Last Myoview May 2011 negative for ischemia.

## 2016-05-30 NOTE — Assessment & Plan Note (Signed)
1 PPD 

## 2016-05-30 NOTE — Assessment & Plan Note (Signed)
Labile B/P recently after diuretics were increased and then his medication decreased at follow up after he was seen at an Urgent Care and his B/P was "low"

## 2016-05-30 NOTE — Telephone Encounter (Signed)
Pt c/o BP issue:  1. What are your last 5 BP readings? average am 96/58 pulse 80 average pm 101-52 pulse 71,  today 7am 96/52 2. Are you having any other symptoms : some dizzy spells only when he gets up to fast 3. What is your medication issue? Went to Urgent Care over the weekend-was taken off Atenolol and Lasix, denies any swelling but wearing compression socks Requesting appt today asap this am  [phone]

## 2016-05-30 NOTE — Assessment & Plan Note (Signed)
History of stent to the right subclavian and innominate vessel, stent to right renal artery.

## 2016-05-30 NOTE — Assessment & Plan Note (Signed)
EF 55-60% with moderate LVH by echo 05/24/16

## 2016-05-30 NOTE — Assessment & Plan Note (Signed)
Chronic LE edema 

## 2016-06-01 ENCOUNTER — Telehealth: Payer: Self-pay | Admitting: *Deleted

## 2016-06-01 DIAGNOSIS — E875 Hyperkalemia: Secondary | ICD-10-CM

## 2016-06-01 LAB — CBC
HCT: 35.8 % — ABNORMAL LOW (ref 38.5–50.0)
Hemoglobin: 11.9 g/dL — ABNORMAL LOW (ref 13.2–17.1)
MCH: 32.2 pg (ref 27.0–33.0)
MCHC: 33.2 g/dL (ref 32.0–36.0)
MCV: 96.8 fL (ref 80.0–100.0)
MPV: 9.6 fL (ref 7.5–12.5)
Platelets: 118 10*3/uL — ABNORMAL LOW (ref 140–400)
RBC: 3.7 MIL/uL — ABNORMAL LOW (ref 4.20–5.80)
RDW: 15.3 % — ABNORMAL HIGH (ref 11.0–15.0)
WBC: 7.9 10*3/uL (ref 3.8–10.8)

## 2016-06-01 LAB — BASIC METABOLIC PANEL
BUN: 46 mg/dL — ABNORMAL HIGH (ref 7–25)
CO2: 23 mmol/L (ref 20–31)
Calcium: 8.3 mg/dL — ABNORMAL LOW (ref 8.6–10.3)
Chloride: 108 mmol/L (ref 98–110)
Creat: 1.9 mg/dL — ABNORMAL HIGH (ref 0.70–1.18)
Glucose, Bld: 91 mg/dL (ref 65–99)
Potassium: 5 mmol/L (ref 3.5–5.3)
Sodium: 138 mmol/L (ref 135–146)

## 2016-06-01 LAB — TSH: TSH: 2.83 mIU/L (ref 0.40–4.50)

## 2016-06-01 LAB — MAGNESIUM: Magnesium: 2.7 mg/dL — ABNORMAL HIGH (ref 1.5–2.5)

## 2016-06-01 NOTE — Telephone Encounter (Signed)
Patient voiced understanding to stop the potassium. Lab orders mailed to the pt

## 2016-06-01 NOTE — Telephone Encounter (Signed)
-----   Message from Erlene Quan, [place] sent at 06/01/2016 10:28 AM EST ----- Stop K+ supplement. Re check BMP in a week.Kerin Ransom PA-C 06/01/2016 10:28 AM

## 2016-06-13 DIAGNOSIS — E875 Hyperkalemia: Secondary | ICD-10-CM | POA: Diagnosis not present

## 2016-06-14 ENCOUNTER — Telehealth: Payer: Self-pay | Admitting: Cardiovascular Disease

## 2016-06-14 NOTE — Telephone Encounter (Signed)
Spoke to patient, reviewed mychart. He got a message regarding recall notification, but already has appt. Confirmed appt info. He's also aware we will call when labs result from yesterday. Had no further questions, aware to call if needs.

## 2016-06-14 NOTE — Telephone Encounter (Signed)
Pt says he has a message in my chart unable to get it. He thinks it might be his lab results from yesterday.

## 2016-06-23 ENCOUNTER — Telehealth: Payer: Self-pay | Admitting: Cardiology

## 2016-06-23 ENCOUNTER — Encounter: Payer: Self-pay | Admitting: Cardiology

## 2016-06-23 ENCOUNTER — Ambulatory Visit (INDEPENDENT_AMBULATORY_CARE_PROVIDER_SITE_OTHER): Payer: PPO | Admitting: Cardiology

## 2016-06-23 DIAGNOSIS — R600 Localized edema: Secondary | ICD-10-CM

## 2016-06-23 DIAGNOSIS — J9601 Acute respiratory failure with hypoxia: Secondary | ICD-10-CM | POA: Diagnosis not present

## 2016-06-23 DIAGNOSIS — I2510 Atherosclerotic heart disease of native coronary artery without angina pectoris: Secondary | ICD-10-CM

## 2016-06-23 DIAGNOSIS — E6609 Other obesity due to excess calories: Secondary | ICD-10-CM

## 2016-06-23 DIAGNOSIS — Z720 Tobacco use: Secondary | ICD-10-CM

## 2016-06-23 DIAGNOSIS — I4891 Unspecified atrial fibrillation: Secondary | ICD-10-CM | POA: Diagnosis not present

## 2016-06-23 DIAGNOSIS — J9611 Chronic respiratory failure with hypoxia: Secondary | ICD-10-CM | POA: Insufficient documentation

## 2016-06-23 DIAGNOSIS — I5032 Chronic diastolic (congestive) heart failure: Secondary | ICD-10-CM | POA: Diagnosis not present

## 2016-06-23 DIAGNOSIS — I739 Peripheral vascular disease, unspecified: Secondary | ICD-10-CM

## 2016-06-23 DIAGNOSIS — E669 Obesity, unspecified: Secondary | ICD-10-CM | POA: Insufficient documentation

## 2016-06-23 DIAGNOSIS — E66811 Obesity, class 1: Secondary | ICD-10-CM

## 2016-06-23 DIAGNOSIS — I10 Essential (primary) hypertension: Secondary | ICD-10-CM

## 2016-06-23 DIAGNOSIS — Z7901 Long term (current) use of anticoagulants: Secondary | ICD-10-CM

## 2016-06-23 MED ORDER — METOPROLOL TARTRATE 25 MG PO TABS: ORAL_TABLET | ORAL | 6 refills | Status: DC

## 2016-06-23 MED ORDER — LOSARTAN POTASSIUM 25 MG PO TABS: 25.0000 mg | ORAL_TABLET | Freq: Every day | ORAL | 6 refills | Status: DC

## 2016-06-23 MED ORDER — APIXABAN 5 MG PO TABS: 5.0000 mg | ORAL_TABLET | Freq: Two times a day (BID) | ORAL | 6 refills | Status: DC

## 2016-06-23 NOTE — Assessment & Plan Note (Signed)
History of stent to the right subclavian and innominate vessel, stent to right renal artery.

## 2016-06-23 NOTE — Assessment & Plan Note (Signed)
Tolerating Eliquis 

## 2016-06-23 NOTE — Assessment & Plan Note (Signed)
Pt has been SOB- "breathing harder than I think I should be". His O2 sat in the office on room air is 83. He is not in CHF and I believe this is primarily a pulmonary issue.

## 2016-06-23 NOTE — Patient Instructions (Addendum)
See PCP about low O2 saturation 83 %     Stop Atenolol  Start Metoprolol 25 mg 1/2 tablet twice a day  Start taking tomorrow 06/24/16   Stop Lotensin  Start taking Losartan 25 mg daily   Start taking tomorrow 06/24/16   Your physician recommends keep your appointment with Dr.Berry as planned 07/15/16 at 10:45 am.

## 2016-06-23 NOTE — Telephone Encounter (Signed)
Spoke with patient and he stated that since the change from Plavix to Eliquis he has had increasing shortness of breath  Denies any change in weight or swelling. He does weigh himself daily Denies any change in stools or visible bleeding Discussed with Doreene Burke PA and will see patient today at 12 noon Patient aware of appointment time.

## 2016-06-23 NOTE — Addendum Note (Signed)
Addended by: Kathyrn Lass on: 06/23/2016 01:38 PM   Modules accepted: Orders

## 2016-06-23 NOTE — Assessment & Plan Note (Signed)
Controlled.  

## 2016-06-23 NOTE — Assessment & Plan Note (Signed)
He has converted to NSR with resumption of his beta blocker but Atenolol may be exacerbating his COPD and I'll change this to a more selective beta blocker at a lower dose (HR 56). Will start Lopressor 12.5 mg BID tomorrow.

## 2016-06-23 NOTE — Assessment & Plan Note (Addendum)
EF 55% by echo Nov 2017. He keeps meticulous records of his wgts and this has been stable.

## 2016-06-23 NOTE — Assessment & Plan Note (Signed)
Long time smoker, I suspect he has significant COPD

## 2016-06-23 NOTE — Assessment & Plan Note (Signed)
Chronic LE edema 

## 2016-06-23 NOTE — Assessment & Plan Note (Signed)
BMI 33. He has truncal obesity and possibly sleep apnea

## 2016-06-23 NOTE — Progress Notes (Signed)
06/23/2016 Kevin Watkins   1940-10-07  [id number]  Primary Physician Ronita Hipps, MD Primary Cardiologist: [name]  HPI:  75 year old obese male with a history of CAD and PVOD. [name] stented his right coronary artery with a bare metal stent in December of 2001. At that time he did have a 60% mid dominant RCA stenosis as well as a 50-60% OM2 branch stenosis with normal LV function. He had a negative Myoview Nov 28, 2009 and denies chest pain or shortness of breath. [name] stented his right subclavian and innominate vessel as well as his right renal artery. He has no known ostial left common carotid artery stenosis and left subclavian artery stenosis as well. He just saw [name] in the office 05/03/16. After that he went to an Urgent Care for LE edema. His diuretics were doubled. The pt weighs himself daily and keeps meticulous records. He self adjusts his Lasix appropriately.  He brought in a log that showed these to be stable-207-209  In Nov his Atenolol and Lasix were stopped secondary to low B/P. Wne I saw him in the office 06/01/16 his EKG showed AF- new for him.  I resumed his Atenolol and added Eliquis. He was added on to my schedule today with SOB. In the office his EKG shows NSR, SB-56.    Current Outpatient Prescriptions  Medication Sig Dispense Refill  . amLODipine (NORVASC) 5 MG tablet Take 5 mg by mouth daily.    [name] apixaban (ELIQUIS) 5 MG TABS tablet Take 1 tablet (5 mg total) by mouth 2 (two) times daily. 60 tablet 5  . aspirin EC 81 MG tablet Take 81 mg by mouth daily.    [name] atenolol (TENORMIN) 50 MG tablet Take 1 tablet (50 mg total) by mouth daily. 30 tablet 5  . atorvastatin (LIPITOR) 40 MG tablet Take 40 mg by mouth daily at 6 PM.     . benazepril (LOTENSIN) 20 MG tablet Take 20 mg by mouth daily.     . folic acid (FOLVITE) 1 MG tablet Take 800 mcg by mouth daily.     . furosemide (LASIX) 40 MG tablet Take 2 tablets (80 mg total) by mouth daily. 90 tablet 3  . Garlic  123XX123 MG CAPS Take 1 capsule by mouth daily.     . nitroGLYCERIN (NITROSTAT) 0.4 MG SL tablet Place 0.4 mg under the tongue every 5 (five) minutes as needed for chest pain.    . Omega-3 Fatty Acids (FISH OIL) 300 MG CAPS Take 1,200 mg by mouth daily.     . Polyethylene Glycol 3350 (MIRALAX PO) Take 17 g by mouth daily.     [name] PROAIR HFA 108 (90 Base) MCG/ACT inhaler Inhale 2 puffs into the lungs every 6 (six) hours as needed.    . pyridOXINE (VITAMIN B-6) 50 MG tablet Take 50 mg by mouth daily.    . ranitidine (ZANTAC) 75 MG tablet Take 75 mg by mouth daily.    [name] SPIRIVA HANDIHALER 18 MCG inhalation capsule Place 18 mcg into inhaler and inhale daily.     . vitamin E (VITAMIN E) 400 UNIT capsule Take 400 Units by mouth daily.    . Wheat Dextrin (BENEFIBER PO) Take by mouth daily. 4 teaspoons    . zolpidem (AMBIEN) 10 MG tablet Take 10 mg by mouth at bedtime as needed for sleep.     No current facility-administered medications for this visit.     No Known Allergies  Social  History   Social History  . Marital status: Married    Spouse name: N/A  . Number of children: N/A  . Years of education: N/A   Occupational History  . Not on file.   Social History Main Topics  . Smoking status: Current Every Day Smoker    Packs/day: 1.00    Types: Cigarettes  . Smokeless tobacco: Never Used     Comment: also electronic cigarettes  . Alcohol use 12.6 oz/week    21 Cans of beer per week     Comment: beer  . Drug use: No  . Sexual activity: Not on file   Other Topics Concern  . Not on file   Social History Narrative  . No narrative on file     Review of Systems: General: negative for chills, fever, night sweats or weight changes.  Cardiovascular: negative for chest pain, dyspnea on exertion, edema, orthopnea, palpitations, paroxysmal nocturnal dyspnea or shortness of breath Dermatological: negative for rash Respiratory: negative for cough or wheezing Urologic: negative for  hematuria Abdominal: negative for nausea, vomiting, diarrhea, bright red blood per rectum, melena, or hematemesis Neurologic: negative for visual changes, syncope, or dizziness All other systems reviewed and are otherwise negative except as noted above.    Blood pressure 136/72, pulse (!) 56, height 5\' 7"  (1.702 m), weight 214 lb 9.6 oz (97.3 kg).  General appearance: alert, cooperative, no distress and moderately obese Neck: no JVD Lungs: decreased breath sounds bilateraly with expiratory wheezing on the Rt Heart: regular rate and rhythm Abdomen: truncal obesity Extremities: 1+ chronic LE edema Pulses: 2+ and symmetric Skin: Skin color, texture, turgor normal. No rashes or lesions Neurologic: Grossly normal  EKG NSR, SB  ASSESSMENT AND PLAN:   Acute respiratory failure with hypoxia (HCC) Pt has been SOB- "breathing harder than I think I should be". His O2 sat in the office on room air is 83. He is not in CHF and I believe this is primarily a pulmonary issue.  Atrial fibrillation, new onset ([place]) He has converted to NSR with resumption of his beta blocker but Atenolol may be exacerbating his COPD and I'll change this to a more selective beta blocker at a lower dose (HR 56). Will start Lopressor 12.5 mg BID tomorrow.   CAD-RCA BMS 2001, low risk Myoview 2011 Stent to the RCA bare-metal stent December 2001, known 60% mid dominant RCA stenosis and 50-60% OM 2 branch stenosis normal LV function. Last Myoview May 2011. No angina complaints  Chronic diastolic heart failure ([place]) EF 55% by echo Nov 2017. He keeps meticulous records of his wgts and this has been stable.   Essential hypertension Controlled  Bilateral leg edema Chronic LE edema  PAD- multiple interventions- renal, iliac, SCA  History of stent to the right subclavian and innominate vessel, stent to right renal artery.   Tobacco abuse Long time smoker, I suspect he has significant COPD  Obesity BMI 33. He has  truncal obesity and possibly sleep apnea  Chronic anticoagulation Tolerating Eliquis   PLAN  I suggested we change his Atenolol to Lopressor and decrease the dose. I also changed his ACE to an ARB with his COPD. I offered him adm to the hospital but he would prefer to try and handle this as an OP. I told him the changes I make today probably won't make him feel better immediatly. Despite his )2 sat he is in no acute distress. I suggested her follow up with his PCP ASAP as he  may need a steroid dose pack and/ or a course of antibiotics. I also suggested he get a set of PFTs and see a pulmonologist which he would like to do in [place].   Kerin Ransom PA-C 06/23/2016 1:00 PM

## 2016-06-23 NOTE — Telephone Encounter (Signed)
Pt saw Lurena Joiner on 05-30-16. Luke changed his Plavix to Eliquis. Pt is so short of breath.

## 2016-06-23 NOTE — Assessment & Plan Note (Signed)
Stent to the RCA bare-metal stent December 2001, known 60% mid dominant RCA stenosis and 50-60% OM 2 branch stenosis normal LV function. Last Myoview May 2011. No angina complaints

## 2016-06-24 ENCOUNTER — Encounter (HOSPITAL_COMMUNITY): Payer: Self-pay

## 2016-06-24 ENCOUNTER — Emergency Department (HOSPITAL_COMMUNITY)
Admission: EM | Admit: 2016-06-24 | Discharge: 2016-06-24 | Disposition: A | Discharge status: Home / Self Care | Payer: PPO | Attending: Emergency Medicine | Admitting: Emergency Medicine

## 2016-06-24 ENCOUNTER — Emergency Department (HOSPITAL_COMMUNITY): Payer: PPO

## 2016-06-24 DIAGNOSIS — I2510 Atherosclerotic heart disease of native coronary artery without angina pectoris: Secondary | ICD-10-CM | POA: Insufficient documentation

## 2016-06-24 DIAGNOSIS — R0602 Shortness of breath: Secondary | ICD-10-CM | POA: Diagnosis not present

## 2016-06-24 DIAGNOSIS — Z7901 Long term (current) use of anticoagulants: Secondary | ICD-10-CM | POA: Diagnosis not present

## 2016-06-24 DIAGNOSIS — I5042 Chronic combined systolic (congestive) and diastolic (congestive) heart failure: Secondary | ICD-10-CM | POA: Diagnosis not present

## 2016-06-24 DIAGNOSIS — Z955 Presence of coronary angioplasty implant and graft: Secondary | ICD-10-CM | POA: Insufficient documentation

## 2016-06-24 DIAGNOSIS — Z79899 Other long term (current) drug therapy: Secondary | ICD-10-CM | POA: Insufficient documentation

## 2016-06-24 DIAGNOSIS — I110 Hypertensive heart disease with heart failure: Secondary | ICD-10-CM | POA: Insufficient documentation

## 2016-06-24 DIAGNOSIS — F17210 Nicotine dependence, cigarettes, uncomplicated: Secondary | ICD-10-CM | POA: Insufficient documentation

## 2016-06-24 DIAGNOSIS — J441 Chronic obstructive pulmonary disease with (acute) exacerbation: Secondary | ICD-10-CM

## 2016-06-24 DIAGNOSIS — Z7982 Long term (current) use of aspirin: Secondary | ICD-10-CM | POA: Insufficient documentation

## 2016-06-24 LAB — CBC
HCT: 35.4 % — ABNORMAL LOW (ref 39.0–52.0)
Hemoglobin: 12.5 g/dL — ABNORMAL LOW (ref 13.0–17.0)
MCH: 33.4 pg (ref 26.0–34.0)
MCHC: 35.3 g/dL (ref 30.0–36.0)
MCV: 94.7 fL (ref 78.0–100.0)
PLATELETS: 141 10*3/uL — AB (ref 150–400)
RBC: 3.74 MIL/uL — ABNORMAL LOW (ref 4.22–5.81)
RDW: 16.1 % — AB (ref 11.5–15.5)
WBC: 8.4 10*3/uL (ref 4.0–10.5)

## 2016-06-24 LAB — I-STAT TROPONIN, ED: TROPONIN I, POC: 0.02 ng/mL (ref 0.00–0.08)

## 2016-06-24 LAB — BASIC METABOLIC PANEL
Anion gap: 11 (ref 5–15)
BUN: 17 mg/dL (ref 6–20)
CO2: 22 mmol/L (ref 22–32)
Calcium: 9.3 mg/dL (ref 8.9–10.3)
Chloride: 104 mmol/L (ref 101–111)
Creatinine, Ser: 1.45 mg/dL — ABNORMAL HIGH (ref 0.61–1.24)
GFR calc Af Amer: 52 mL/min — ABNORMAL LOW (ref >60)
GFR calc non Af Amer: 45 mL/min — ABNORMAL LOW (ref >60)
Glucose, Bld: 136 mg/dL — ABNORMAL HIGH (ref 65–99)
Potassium: 3.4 mmol/L — ABNORMAL LOW (ref 3.5–5.1)
Sodium: 137 mmol/L (ref 135–145)

## 2016-06-24 LAB — BRAIN NATRIURETIC PEPTIDE: B Natriuretic Peptide: 126.6 pg/mL — ABNORMAL HIGH (ref 0.0–100.0)

## 2016-06-24 MED ORDER — ALBUTEROL SULFATE HFA 108 (90 BASE) MCG/ACT IN AERS: 2.0000 | INHALATION_SPRAY | RESPIRATORY_TRACT | 1 refills | Status: DC | PRN

## 2016-06-24 MED ORDER — IPRATROPIUM BROMIDE 0.02 % IN SOLN
0.5000 mg | Freq: Once | RESPIRATORY_TRACT | Status: AC
Start: 2016-06-24 — End: 2016-06-24
  Administered 2016-06-24: 0.5 mg via RESPIRATORY_TRACT
  Filled 2016-06-24: qty 2.5

## 2016-06-24 MED ORDER — PREDNISONE 20 MG PO TABS: 40.0000 mg | ORAL_TABLET | Freq: Every day | ORAL | 0 refills | Status: DC

## 2016-06-24 MED ORDER — ALBUTEROL SULFATE (2.5 MG/3ML) 0.083% IN NEBU
5.0000 mg | INHALATION_SOLUTION | Freq: Once | RESPIRATORY_TRACT | Status: AC
  Administered 2016-06-24: 5 mg via RESPIRATORY_TRACT
  Filled 2016-06-24: qty 6

## 2016-06-24 MED ORDER — METHYLPREDNISOLONE SODIUM SUCC 125 MG IJ SOLR
80.0000 mg | Freq: Once | INTRAMUSCULAR | Status: AC
  Administered 2016-06-24: 80 mg via INTRAVENOUS
  Filled 2016-06-24: qty 2

## 2016-06-24 MED ORDER — FUROSEMIDE 10 MG/ML IJ SOLN
80.0000 mg | Freq: Once | INTRAMUSCULAR | Status: AC
  Administered 2016-06-24: 80 mg via INTRAVENOUS
  Filled 2016-06-24: qty 8

## 2016-06-24 MED ORDER — IPRATROPIUM BROMIDE 0.02 % IN SOLN
0.5000 mg | Freq: Once | RESPIRATORY_TRACT | Status: AC
  Administered 2016-06-24: 0.5 mg via RESPIRATORY_TRACT
  Filled 2016-06-24: qty 2.5

## 2016-06-24 NOTE — ED Notes (Signed)
Patient transported to X-ray 

## 2016-06-24 NOTE — ED Triage Notes (Signed)
Pt. Reports having a cough and sob for a few months.  Pt. Denies any chest pain.  Lungs are diminished.  Pt. Is in no distress.  Skins is dry , warm and pink.  ECG and labs completed in triage. Pt. Has a hx of CHF and also has swelling in his legs. Pt. Denies any n/v/d denies any fevers or chills.  Pt. Was seen by Kerin Ransom yesterday and the recommended that he comes to [place].

## 2016-06-24 NOTE — ED Provider Notes (Signed)
[facility] DEPT Provider Note   CSN: [id number] Arrival date & time: 06/24/16  1125     History   Chief Complaint Chief Complaint  Patient presents with  . Shortness of Breath    HPI Kevin Watkins is a 75 y.o. male.  Patient w hx copd, chf, cad, c/o sob in the past couple weeks. Gradual onset, persistent, constant. Denies specific exacerbating or alleviating factors. Saw his cardiologist yesterday for same - denies change in meds or increased diuretic dose. States takes lasix 40 mg a day, and that if wt goes up, he increases to 80 mg a day for a few days. States his wt has been stable. Mild increase in baseline lower leg edema bilaterally. Denies cough or uri c/o. No chest pain or discomfort. Denies fever or chills. +smoker.    The history is provided by the patient and the spouse.  Shortness of Breath  Associated symptoms include leg swelling. Pertinent negatives include no fever, no headaches, no sore throat, no neck pain, no cough, no chest pain, no abdominal pain and no rash.    Past Medical History:  Diagnosis Date  . CAD in native artery 06/2000   BMS to the RCA; known 60% mid dominant RCA stenosis and 50-60% OM 2 branch stenosis normal LV function. Last Myoview May 2011 negative for ischemia.  . Dyslipidemia, goal LDL below 70   . Edema   . Hypertension   . PAD (peripheral artery disease) (HCC)    iliac, SCA, Innominate, and renal PTA  . Tobacco abuse     Patient Active Problem List   Diagnosis Date Noted  . Acute respiratory failure with hypoxia ([place]) 06/23/2016  . Obesity 06/23/2016  . Chronic anticoagulation 06/23/2016  . Atrial fibrillation, new onset ([place]) 05/30/2016  . Bilateral carotid artery disease ([place]) 10/30/2015  . Epistaxis 04/30/2015  . Chronic diastolic heart failure ([place]) 06/03/2014  . Acute diastolic CHF (congestive heart failure) ([place]) 10/30/2013  . Bilateral leg edema 02/15/2013  . CAD-RCA BMS 2001, low risk Myoview 2011   . PAD- multiple  interventions- renal, iliac, SCA   . Tobacco abuse   . Dyslipidemia, goal LDL below 70   . Essential hypertension     Past Surgical History:  Procedure Laterality Date  . ANGIOPLASTY  '01, '03   innominate artery PTA  . Carotid Doppler  2013   R vertebral appears occluded, R subclavian with prox occlusion v. high grade stenosis; R bulb 70-99% diameter reduction; R prox ICA 50-69% diameter reduction; L bulb & prox ICA 50-69% diameter reduction; L subclavian with >50% diameter reduction  . CORONARY ANGIOPLASTY WITH STENT PLACEMENT  06/26/2000    BMS to RCA; residual 60%mid dominant RCA stent, 50-60% OM2 stenosis (Dr. Adora Fridge)  . NM MYOCAR PERF WALL MOTION  2011   persantine myoview - normal perfusion in all regions, EF 64%  . RENAL ANGIOGRAM  09/22/2005   stent to right renal artery with 5x12 Genesis on Aviator stent balloon pre-mount (Dr. Adora Fridge)  . Renal Doppler  2013   R prox renal artery stent with 60-99% in-stent restenosis; L prox renal artery =/> 60% diameter reduction; R & L kidneys normal in size  . SUBCLAVIAN ANGIOGRAM  07/15/2004   stent to right subclavian (7x18 Genesis) and innominate (8x24 Genesis), with known ostial left common carotid stenosis and left subclavian stenosis as well (Dr. Adora Fridge)  . TRANSTHORACIC ECHOCARDIOGRAM  2013   EF=>55%, mild conc LVH; LA mod dilated; RA mild-mod  dilated; mild mitral annular calcif, mild MR; mild TR with normal RSVP  . Upper Extremity Arterial Doppler  2013   R prox subclavian artery stent with prox occlusion v. high grade stenosis; R vertebral appears occluded; L subclavian with >60% diameter reduction       Home Medications    Prior to Admission medications   Medication Sig Start Date End Date Taking? Authorizing Provider  amLODipine (NORVASC) 5 MG tablet Take 5 mg by mouth daily.    Historical Provider, MD  apixaban (ELIQUIS) 5 MG TABS tablet Take 1 tablet (5 mg total) by mouth 2 (two) times daily. 06/23/16   Erlene Quan,  PA-C  aspirin EC 81 MG tablet Take 81 mg by mouth daily.    Historical Provider, MD  atorvastatin (LIPITOR) 40 MG tablet Take 40 mg by mouth daily at 6 PM.  12/26/12   Historical Provider, MD  folic acid (FOLVITE) 1 MG tablet Take 800 mcg by mouth daily.     Historical Provider, MD  furosemide (LASIX) 40 MG tablet Take 2 tablets (80 mg total) by mouth daily. 05/30/16   Erlene Quan, PA-C  Garlic [id number] MG CAPS Take 1 capsule by mouth daily.     Historical Provider, MD  losartan (COZAAR) 25 MG tablet Take 1 tablet (25 mg total) by mouth daily. 06/23/16 09/21/16  Erlene Quan, PA-C  metoprolol tartrate (LOPRESSOR) 25 MG tablet Take 1/2 tablet twice a day 06/23/16   Erlene Quan, PA-C  nitroGLYCERIN (NITROSTAT) 0.4 MG SL tablet Place 0.4 mg under the tongue every 5 (five) minutes as needed for chest pain.    Historical Provider, MD  Omega-3 Fatty Acids (FISH OIL) 300 MG CAPS Take 1,200 mg by mouth daily.     Historical Provider, MD  Polyethylene Glycol 3350 (MIRALAX PO) Take 17 g by mouth daily.     Historical Provider, MD  PROAIR HFA [phone] Base) MCG/ACT inhaler Inhale 2 puffs into the lungs every 6 (six) hours as needed. 05/12/16   Historical Provider, MD  pyridOXINE (VITAMIN B-6) 50 MG tablet Take 50 mg by mouth daily.    Historical Provider, MD  ranitidine (ZANTAC) 75 MG tablet Take 75 mg by mouth daily.    Historical Provider, MD  SPIRIVA HANDIHALER 18 MCG inhalation capsule Place 18 mcg into inhaler and inhale daily.  02/07/13   Historical Provider, MD  vitamin E (VITAMIN E) 400 UNIT capsule Take 400 Units by mouth daily.    Historical Provider, MD  Wheat Dextrin (BENEFIBER PO) Take by mouth daily. 4 teaspoons    Historical Provider, MD  zolpidem (AMBIEN) 10 MG tablet Take 10 mg by mouth at bedtime as needed for sleep.    Historical Provider, MD    Family History Family History  Problem Relation Age of Onset  . Heart disease Mother     hx CABG  . Heart disease Father     hx of CABG  .  Cancer Sister   . Heart attack Brother   . Heart disease Brother     Social History Social History  Substance Use Topics  . Smoking status: Current Every Day Smoker    Packs/day: 1.00    Types: Cigarettes  . Smokeless tobacco: Never Used     Comment: also electronic cigarettes  . Alcohol use 12.6 oz/week    21 Cans of beer per week     Comment: beer     Allergies   Patient has no known  allergies.   Review of Systems Review of Systems  Constitutional: Negative for chills and fever.  HENT: Negative for sore throat.   Eyes: Negative for redness.  Respiratory: Positive for shortness of breath. Negative for cough.   Cardiovascular: Positive for leg swelling. Negative for chest pain.  Gastrointestinal: Negative for abdominal pain.  Genitourinary: Negative for flank pain.  Musculoskeletal: Negative for back pain and neck pain.  Skin: Negative for rash.  Neurological: Negative for headaches.  Hematological: Does not bruise/bleed easily.  Psychiatric/Behavioral: Negative for confusion.     Physical Exam Updated Vital Signs BP 137/95 (BP Location: Left Arm)   Pulse (!) 59   Temp 98.5 F (36.9 C) (Oral)   Resp 21   Ht 5\' 7"  (1.702 m)   Wt 97.1 kg   SpO2 96%   BMI 33.52 kg/m   Physical Exam  Constitutional: He is oriented to person, place, and time. He appears well-developed and well-nourished. No distress.  HENT:  Mouth/Throat: Oropharynx is clear and moist.  Eyes: Conjunctivae are normal.  Neck: Neck supple. No tracheal deviation present.  Cardiovascular: Normal rate, regular rhythm, normal heart sounds and intact distal pulses.  Exam reveals no gallop and no friction rub.   No murmur heard. Pulmonary/Chest: Effort normal. No accessory muscle usage. No respiratory distress.  Basilar rale. Decreased air movement bil, bil wheeze - mild.   Abdominal: Soft. He exhibits no distension. There is no tenderness.  Musculoskeletal: He exhibits edema.  Moderate bil ankle  and leg edema to knees bil.   Neurological: He is alert and oriented to person, place, and time.  Skin: Skin is warm and dry.  Psychiatric: He has a normal mood and affect.  Nursing note and vitals reviewed.    ED Treatments / Results  Labs (all labs ordered are listed, but only abnormal results are displayed) Results for orders placed or performed during the hospital encounter of [id number]  Basic metabolic panel  Result Value Ref Range   Sodium 137 135 - 145 mmol/L   Potassium 3.4 (L) 3.5 - 5.1 mmol/L   Chloride 104 101 - 111 mmol/L   CO2 22 22 - 32 mmol/L   Glucose, Bld 136 (H) 65 - 99 mg/dL   BUN 17 6 - 20 mg/dL   Creatinine, Ser 1.45 (H) 0.61 - 1.24 mg/dL   Calcium 9.3 8.9 - 10.3 mg/dL   GFR calc non Af Amer 45 (L) >60 mL/min   GFR calc Af Amer 52 (L) >60 mL/min   Anion gap 11 5 - 15  CBC  Result Value Ref Range   WBC 8.4 4.0 - 10.5 K/uL   RBC 3.74 (L) 4.22 - 5.81 MIL/uL   Hemoglobin 12.5 (L) 13.0 - 17.0 g/dL   HCT 35.4 (L) 39.0 - 52.0 %   MCV 94.7 78.0 - 100.0 fL   MCH 33.4 26.0 - 34.0 pg   MCHC 35.3 30.0 - 36.0 g/dL   RDW 16.1 (H) 11.5 - 15.5 %   Platelets 141 (L) 150 - 400 K/uL  Brain natriuretic peptide  Result Value Ref Range   B Natriuretic Peptide 126.6 (H) 0.0 - 100.0 pg/mL  I-stat troponin, ED  Result Value Ref Range   Troponin i, poc 0.02 0.00 - 0.08 ng/mL   Comment 3           Dg Chest 2 View  Result Date: 06/24/2016 CLINICAL DATA:  Shortness of Breath EXAM: CHEST  2 VIEW COMPARISON:  09/27/2013 FINDINGS: Borderline cardiomegaly. Persistent  interstitial prominence bilaterally consistent with pulmonary edema. Small bilateral pleural effusion with basilar atelectasis. Thoracic spine osteopenia. IMPRESSION: Persistent interstitial edema bilaterally. Small bilateral pleural effusion with bilateral basilar atelectasis. Electronically Signed   By: Lahoma Crocker M.D.   On: 06/24/2016 12:28    EKG  EKG Interpretation  Date/Time:  Friday June 24 2016  11:30:42 EST Ventricular Rate:  68 PR Interval:  174 QRS Duration: 92 QT Interval:  422 QTC Calculation: 448 R Axis:   77 Text Interpretation:  Sinus rhythm with marked sinus arrhythmia Non-specific ST-t changes Confirmed by Ashok Cordia  MD, Lennette Bihari ([id number]) on 06/24/2016 12:29:07 PM       Radiology Dg Chest 2 View  Result Date: 06/24/2016 CLINICAL DATA:  Shortness of Breath EXAM: CHEST  2 VIEW COMPARISON:  09/27/2013 FINDINGS: Borderline cardiomegaly. Persistent interstitial prominence bilaterally consistent with pulmonary edema. Small bilateral pleural effusion with basilar atelectasis. Thoracic spine osteopenia. IMPRESSION: Persistent interstitial edema bilaterally. Small bilateral pleural effusion with bilateral basilar atelectasis. Electronically Signed   By: Lahoma Crocker M.D.   On: 06/24/2016 12:28    Procedures Procedures (including critical care time)  Medications Ordered in ED Medications  furosemide (LASIX) injection 80 mg (not administered)  albuterol (PROVENTIL) (2.5 MG/3ML) 0.083% nebulizer solution 5 mg (not administered)  ipratropium (ATROVENT) nebulizer solution 0.5 mg (not administered)     Initial Impression / Assessment and Plan / ED Course  I have reviewed the triage vital signs and the nursing notes.  Pertinent labs & imaging results that were available during my care of the patient were reviewed by me and considered in my medical decision making (see chart for details).  Clinical Course     Labs sent.   Monitor. Ecg.  Lasix 80 mg iv.  Albuterol and atrovent neb.   Reviewed nursing notes and prior charts for additional history.   CXR changes similar to prior.  Pt has received lasix.   For wheezing/copd, has received 2 alb/atr nebs and solumedrol iv.  On recheck is breathing very comfortably.  Patient taken off o2, and sats remain 96-97% on room air.    Patient feels he is breathing much better, and well enough to go home.  Smoking cessation  encouraged. Will give few days rx pred, pt has mdi at home. Will have take extra dose of lasix tomorrow, and f/u with his doctor/cardiology on Monday.  Pt asked about home o2 - discussed w CM/SW in ED - they indicate given ED visit, and that current pulse ox ok, would not qualify, and they recommend f/u pcp for same as outpt.     Final Clinical Impressions(s) / ED Diagnoses   Final diagnoses:  None    New Prescriptions New Prescriptions   No medications on file     Lajean Saver, MD 06/24/16 1454

## 2016-06-24 NOTE — Discharge Instructions (Signed)
It was our pleasure to provide your ER care today - we hope that you feel better.  Take prednisone as prescribed.  Use albuterol inhaler every 3-4 hours as need.  It is also extremely important that you avoid any smoking.   Take an extra dose of your lasix tomorrow.   We discussed home oxygen with our case managers - they indicate given your current oxygen level, that you would need to have your primary care doctor set that up at home - contact your doctors office today to discuss.   Return to ER right away if worse, increased trouble breathing, chest pain, fevers, other concern.

## 2016-06-27 DIAGNOSIS — Z1389 Encounter for screening for other disorder: Secondary | ICD-10-CM | POA: Diagnosis not present

## 2016-06-27 DIAGNOSIS — J449 Chronic obstructive pulmonary disease, unspecified: Secondary | ICD-10-CM | POA: Diagnosis not present

## 2016-06-27 DIAGNOSIS — Z9181 History of falling: Secondary | ICD-10-CM | POA: Diagnosis not present

## 2016-07-15 ENCOUNTER — Encounter: Payer: Self-pay | Admitting: Cardiovascular Disease

## 2016-07-15 ENCOUNTER — Ambulatory Visit (INDEPENDENT_AMBULATORY_CARE_PROVIDER_SITE_OTHER): Payer: PPO | Admitting: Cardiovascular Disease

## 2016-07-15 VITALS — BP 129/44 | HR 87 | Ht 67.0 in | Wt 215.8 lb

## 2016-07-15 DIAGNOSIS — I2510 Atherosclerotic heart disease of native coronary artery without angina pectoris: Secondary | ICD-10-CM | POA: Diagnosis not present

## 2016-07-15 DIAGNOSIS — I739 Peripheral vascular disease, unspecified: Secondary | ICD-10-CM

## 2016-07-15 DIAGNOSIS — I4891 Unspecified atrial fibrillation: Secondary | ICD-10-CM

## 2016-07-15 DIAGNOSIS — I10 Essential (primary) hypertension: Secondary | ICD-10-CM

## 2016-07-15 DIAGNOSIS — E785 Hyperlipidemia, unspecified: Secondary | ICD-10-CM

## 2016-07-15 DIAGNOSIS — Z720 Tobacco use: Secondary | ICD-10-CM | POA: Diagnosis not present

## 2016-07-15 DIAGNOSIS — I5032 Chronic diastolic (congestive) heart failure: Secondary | ICD-10-CM

## 2016-07-15 DIAGNOSIS — R600 Localized edema: Secondary | ICD-10-CM

## 2016-07-15 DIAGNOSIS — I779 Disorder of arteries and arterioles, unspecified: Secondary | ICD-10-CM

## 2016-07-15 NOTE — Assessment & Plan Note (Signed)
History of PAD status post right subclavian artery stenting as well as a nonobstructing right renal artery stenting. It has been shown to be closed and he does have a blood pressure differential. 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

## 2016-07-15 NOTE — Assessment & Plan Note (Signed)
History of PAF currently in sinus rhythm on exam. He was started on Eliquis oral anticoagulation by Kerin Ransom when he saw him in the office last month.

## 2016-07-15 NOTE — Assessment & Plan Note (Signed)
Bilateral lower extremity edema which is 2-3+ today. He is 17 pounds heavier than in the recent past and does admit to dietary indiscretion. Talked about increasing his furosemide from 40-80 mg twice a day for several days and wearing compression stockings as well as salt avoidance.

## 2016-07-15 NOTE — Assessment & Plan Note (Signed)
History of diastolic heart failure with 2-D echo performed 07/25/15 revealing normal LV systolic function without valvular abnormalities. He did have moderate LVH. She is on diuretics which she adjusted according to his weight. He admits to dietary indiscretion with regard to salt.

## 2016-07-15 NOTE — Assessment & Plan Note (Signed)
History of CAD status post RCA bare metal stent in December 2001 with a 3.5 x 12 mm long Medtronic S7 bare-metal stent. He did have a 60% mid dominant RCA stenosis as well as the 50th 60% OM 2 branch stenosis and normal LV function. He had a negative Myoview 11/28/09. He denies chest pain.

## 2016-07-15 NOTE — Assessment & Plan Note (Signed)
Continued tobacco abuse of one half pack per day recalcitrant to risk factor modification 

## 2016-07-15 NOTE — Assessment & Plan Note (Signed)
History of dyslipidemia on statin therapy.  We will recheck a lipid and liver profile. 

## 2016-07-15 NOTE — Assessment & Plan Note (Signed)
History of hypertension blood pressure measures 129/44. He is on losartan and metoprolol. Continue current meds at current dosing

## 2016-07-15 NOTE — Progress Notes (Signed)
07/15/2016 Kevin Watkins   26-Apr-1941  [id number]  Primary Physician Ronita Hipps, MD Primary Cardiologist: Lorretta Harp MD Kevin Watkins  HPI:   The patient is a 76 year old mildly overweight married Caucasian male father of 44, grandfather to 2 grandchildren who I last saw in the office 05/03/16.[name] He has a history of CAD and PVOD. I stented his right coronary artery as a 3.5 mm x 12 mm long Medtronic S7 bare metal stent in December of 2001. At that time he did have a 60% mid dominant RCA stenosis as well as a 50-60% OM2 branch stenosis with normal LV function. He had a negative Myoview Nov 28, 2009 and denies chest pain or shortness of breath. I stented his right subclavian and innominate vessel as well as his right renal artery. He has no known ostial left common carotid artery stenosis and left subclavian artery stenosis as well. He is cutting down his cigarettes from 1 pack per day to 1-2 packs per week and is using an Engineer, manufacturing. His other problem include hypertension and hyperlipidemia. We are following his various Doppler studies in our office.he saw Kerin Ransom PA-C in the office one week ago with complaints of dyspnea or lower extremity edema. Apparently he was advised to liberalize his salt intake. We advised him to avoid salt, and increased his furosemide as well as decreased his amlodipine. He lost 34 pounds. His edema has somewhat improved. We have continued to follow his basic metabolic panel and adjust his diuretics as necessary. He does weigh himself on a daily basis and adjust his diuretics by doubling them for 2-3 days if his weight begins to increase. We've been following his carotid and renal Doppler studies. 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 His right internal carotid artery stenosis has remained stable in the moderately severe range. He denies chest pain, shortness of breath or claudication. He  was seen by Kerin Watkins 06/23/16 and was in atrial fibrillation though he converted. He was begun on oral anticoagulation. He was also recently seen by Kevin Saver MD in the emergency room the following day with shortness of breath and edema. He does admit to dietary indiscretion with regard to salt.   Current Outpatient Prescriptions  Medication Sig Dispense Refill  . albuterol (PROVENTIL HFA;VENTOLIN HFA) 108 (90 Base) MCG/ACT inhaler Inhale 2 puffs into the lungs every 4 (four) hours as needed for wheezing or shortness of breath. 1 Inhaler 1  . amLODipine (NORVASC) 5 MG tablet Take 5 mg by mouth daily.    [name] apixaban (ELIQUIS) 5 MG TABS tablet Take 1 tablet (5 mg total) by mouth 2 (two) times daily. 60 tablet 6  . aspirin EC 81 MG tablet Take 81 mg by mouth daily.    [name] atorvastatin (LIPITOR) 40 MG tablet Take 40 mg by mouth daily at 6 PM.     . folic acid (FOLVITE) 1 MG tablet Take 800 mcg by mouth daily.     . furosemide (LASIX) 40 MG tablet Take 2 tablets (80 mg total) by mouth daily. 90 tablet 3  . Garlic [id number] MG CAPS Take 1 capsule by mouth daily.     [name] losartan (COZAAR) 25 MG tablet Take 1 tablet (25 mg total) by mouth daily. 30 tablet 6  . metoprolol tartrate (LOPRESSOR) 25 MG tablet Take 1/2 tablet twice a day 30 tablet 6  . nitroGLYCERIN (NITROSTAT) 0.4  MG SL tablet Place 0.4 mg under the tongue every 5 (five) minutes as needed for chest pain.    . Omega-3 Fatty Acids (FISH OIL) 300 MG CAPS Take 1,200 mg by mouth daily.     . Polyethylene Glycol 3350 (MIRALAX PO) Take 17 g by mouth daily.     [name] PROAIR HFA 108 (90 Base) MCG/ACT inhaler Inhale 2 puffs into the lungs every 6 (six) hours as needed.    . pyridOXINE (VITAMIN B-6) 50 MG tablet Take 50 mg by mouth daily.    . ranitidine (ZANTAC) 75 MG tablet Take 75 mg by mouth daily.    [name] SPIRIVA HANDIHALER 18 MCG inhalation capsule Place 18 mcg into inhaler and inhale daily.     . vitamin E (VITAMIN E) 400 UNIT capsule Take 400 Units by  mouth daily.    . Wheat Dextrin (BENEFIBER PO) Take by mouth daily. 4 teaspoons    . zolpidem (AMBIEN) 10 MG tablet Take 10 mg by mouth at bedtime as needed for sleep.     No current facility-administered medications for this visit.     No Known Allergies  Social History   Social History  . Marital status: Married    Spouse name: N/A  . Number of children: N/A  . Years of education: N/A   Occupational History  . Not on file.   Social History Main Topics  . Smoking status: Current Every Day Smoker    Packs/day: 1.00    Types: Cigarettes  . Smokeless tobacco: Never Used     Comment: also electronic cigarettes  . Alcohol use 12.6 oz/week    21 Cans of beer per week     Comment: beer  . Drug use: No  . Sexual activity: Not on file   Other Topics Concern  . Not on file   Social History Narrative  . No narrative on file     Review of Systems: General: negative for chills, fever, night sweats or weight changes.  Cardiovascular: negative for chest pain, dyspnea on exertion, edema, orthopnea, palpitations, paroxysmal nocturnal dyspnea or shortness of breath Dermatological: negative for rash Respiratory: negative for cough or wheezing Urologic: negative for hematuria Abdominal: negative for nausea, vomiting, diarrhea, bright red blood per rectum, melena, or hematemesis Neurologic: negative for visual changes, syncope, or dizziness All other systems reviewed and are otherwise negative except as noted above.    Blood pressure (!) 129/44, pulse 87, height 5\' 7"  (1.702 m), weight 215 lb 12.8 oz (97.9 kg), SpO2 98 %.  General appearance: alert and no distress Neck: no adenopathy, no JVD, supple, symmetrical, trachea midline, thyroid not enlarged, symmetric, no tenderness/mass/nodules and Loud bilateral carotid bruits right greater than left Lungs: clear to auscultation bilaterally Heart: regular rate and rhythm, S1, S2 normal, no murmur, click, rub or gallop Extremities: 2-3+  pitting edema bilaterally  EKG not performed today  ASSESSMENT AND PLAN:   CAD-RCA BMS 2001, low risk Myoview 2011 History of CAD status post RCA bare metal stent in December 2001 with a 3.5 x 12 mm long Medtronic S7 bare-metal stent. He did have a 60% mid dominant RCA stenosis as well as the 50th 60% OM 2 branch stenosis and normal LV function. He had a negative Myoview 11/28/09. He denies chest pain.  PAD- multiple interventions- renal, iliac, SCA History of PAD status post right subclavian artery stenting as well as a nonobstructing right renal artery stenting. It has been shown to be closed and he does  have a blood pressure differential. 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  Tobacco abuse Continued tobacco abuse of one half pack per day recalcitrant to risk factor modification  Dyslipidemia, goal LDL below 70 History of dyslipidemia on statin therapy. We will recheck a lipid and liver profile  Chronic diastolic heart failure ([place]) History of diastolic heart failure with 2-D echo performed 07/25/15 revealing normal LV systolic function without valvular abnormalities. He did have moderate LVH. She is on diuretics which she adjusted according to his weight. He admits to dietary indiscretion with regard to salt.  Essential hypertension History of hypertension blood pressure measures 129/44. He is on losartan and metoprolol. Continue current meds at current dosing  Bilateral leg edema Bilateral lower extremity edema which is 2-3+ today. He is 17 pounds heavier than in the recent past and does admit to dietary indiscretion. Talked about increasing his furosemide from 40-80 mg twice a day for several days and wearing compression stockings as well as salt avoidance.  Bilateral carotid artery disease (HCC) History of carotid artery disease with Dopplers performed 11/11/15 revealing  moderately severe right ICA stenosis as well as moderate left. He is scheduled to have these rechecked in May of this year.  Atrial fibrillation, new onset ([place]) History of PAF currently in sinus rhythm on exam. He was started on Eliquis oral anticoagulation by Kerin Watkins when he saw him in the office last month.      Lorretta Harp MD FACP,FACC,FAHA, FSCAI 07/15/2016 11:00 AM

## 2016-07-15 NOTE — Assessment & Plan Note (Signed)
History of carotid artery disease with Dopplers performed 11/11/15 revealing moderately severe right ICA stenosis as well as moderate left. He is scheduled to have these rechecked in May of this year.

## 2016-07-15 NOTE — Patient Instructions (Signed)
Medication Instructions: Your physician recommends that you continue on your current medications as directed. Please refer to the Current Medication list given to you today.   Labwork: Your physician recommends that you return for a FASTING lipid profile and hepatic function panel.   Testing/Procedures: Schedule Carotid doppler for May 2018.  Follow-Up: We request that you follow-up in: 3 months with Kerin Ransom, PA-C and in 6 months with Dr Andria Rhein will receive a reminder letter in the mail two months in advance. If you don't receive a letter, please call our office to schedule the follow-up appointment.  If you need a refill on your cardiac medications before your next appointment, please call your pharmacy.

## 2016-07-18 DIAGNOSIS — E785 Hyperlipidemia, unspecified: Secondary | ICD-10-CM | POA: Diagnosis not present

## 2016-07-18 DIAGNOSIS — I119 Hypertensive heart disease without heart failure: Secondary | ICD-10-CM | POA: Diagnosis not present

## 2016-07-18 DIAGNOSIS — J449 Chronic obstructive pulmonary disease, unspecified: Secondary | ICD-10-CM | POA: Diagnosis not present

## 2016-07-18 DIAGNOSIS — G4700 Insomnia, unspecified: Secondary | ICD-10-CM | POA: Diagnosis not present

## 2016-07-20 ENCOUNTER — Other Ambulatory Visit: Payer: Self-pay

## 2016-07-20 DIAGNOSIS — J449 Chronic obstructive pulmonary disease, unspecified: Secondary | ICD-10-CM | POA: Diagnosis not present

## 2016-07-20 MED ORDER — METOPROLOL TARTRATE 25 MG PO TABS: ORAL_TABLET | ORAL | 3 refills | Status: DC

## 2016-07-20 MED ORDER — LOSARTAN POTASSIUM 25 MG PO TABS: 25.0000 mg | ORAL_TABLET | Freq: Every day | ORAL | 3 refills | Status: DC

## 2016-07-21 ENCOUNTER — Ambulatory Visit: Payer: PPO | Admitting: Cardiology

## 2016-07-21 DIAGNOSIS — J449 Chronic obstructive pulmonary disease, unspecified: Secondary | ICD-10-CM | POA: Diagnosis not present

## 2016-07-26 ENCOUNTER — Telehealth: Payer: Self-pay | Admitting: Cardiovascular Disease

## 2016-07-26 NOTE — Telephone Encounter (Signed)
Received ov note from [place], Dr. Kennith Maes for Dr. Gwenlyn Found. Will have note scanned into pt chart.

## 2016-07-28 DIAGNOSIS — J449 Chronic obstructive pulmonary disease, unspecified: Secondary | ICD-10-CM | POA: Diagnosis not present

## 2016-08-28 DIAGNOSIS — J449 Chronic obstructive pulmonary disease, unspecified: Secondary | ICD-10-CM | POA: Diagnosis not present

## 2016-08-30 DIAGNOSIS — J449 Chronic obstructive pulmonary disease, unspecified: Secondary | ICD-10-CM | POA: Diagnosis not present

## 2016-09-05 DIAGNOSIS — I119 Hypertensive heart disease without heart failure: Secondary | ICD-10-CM | POA: Diagnosis not present

## 2016-09-15 DIAGNOSIS — H35373 Puckering of macula, bilateral: Secondary | ICD-10-CM | POA: Diagnosis not present

## 2016-09-15 DIAGNOSIS — H35341 Macular cyst, hole, or pseudohole, right eye: Secondary | ICD-10-CM | POA: Diagnosis not present

## 2016-09-25 DIAGNOSIS — J449 Chronic obstructive pulmonary disease, unspecified: Secondary | ICD-10-CM | POA: Diagnosis not present

## 2016-09-27 DIAGNOSIS — J449 Chronic obstructive pulmonary disease, unspecified: Secondary | ICD-10-CM | POA: Diagnosis not present

## 2016-10-20 DIAGNOSIS — H35371 Puckering of macula, right eye: Secondary | ICD-10-CM | POA: Diagnosis not present

## 2016-10-26 DIAGNOSIS — J449 Chronic obstructive pulmonary disease, unspecified: Secondary | ICD-10-CM | POA: Diagnosis not present

## 2016-10-27 DIAGNOSIS — R040 Epistaxis: Secondary | ICD-10-CM | POA: Diagnosis not present

## 2016-10-28 DIAGNOSIS — J449 Chronic obstructive pulmonary disease, unspecified: Secondary | ICD-10-CM | POA: Diagnosis not present

## 2016-11-03 DIAGNOSIS — H35373 Puckering of macula, bilateral: Secondary | ICD-10-CM | POA: Diagnosis not present

## 2016-11-08 DIAGNOSIS — H35372 Puckering of macula, left eye: Secondary | ICD-10-CM | POA: Diagnosis not present

## 2016-11-14 ENCOUNTER — Ambulatory Visit (HOSPITAL_COMMUNITY)
Admission: RE | Admit: 2016-11-14 | Discharge: 2016-11-14 | Disposition: A | Discharge status: Home / Self Care | Payer: PPO | Source: Ambulatory Visit | Attending: Cardiovascular Disease | Admitting: Cardiovascular Disease

## 2016-11-14 DIAGNOSIS — I779 Disorder of arteries and arterioles, unspecified: Secondary | ICD-10-CM | POA: Diagnosis not present

## 2016-11-14 DIAGNOSIS — I6523 Occlusion and stenosis of bilateral carotid arteries: Secondary | ICD-10-CM | POA: Diagnosis not present

## 2016-11-14 DIAGNOSIS — I739 Peripheral vascular disease, unspecified: Secondary | ICD-10-CM

## 2016-11-17 ENCOUNTER — Other Ambulatory Visit: Payer: Self-pay | Admitting: Cardiovascular Disease

## 2016-11-17 DIAGNOSIS — I779 Disorder of arteries and arterioles, unspecified: Secondary | ICD-10-CM

## 2016-11-17 DIAGNOSIS — I739 Peripheral vascular disease, unspecified: Principal | ICD-10-CM

## 2016-11-25 DIAGNOSIS — J449 Chronic obstructive pulmonary disease, unspecified: Secondary | ICD-10-CM | POA: Diagnosis not present

## 2016-11-27 DIAGNOSIS — J449 Chronic obstructive pulmonary disease, unspecified: Secondary | ICD-10-CM | POA: Diagnosis not present

## 2016-12-08 DIAGNOSIS — H35372 Puckering of macula, left eye: Secondary | ICD-10-CM | POA: Diagnosis not present

## 2016-12-17 ENCOUNTER — Inpatient Hospital Stay (HOSPITAL_COMMUNITY)
Admission: EM | Admit: 2016-12-17 | Discharge: 2016-12-24 | DRG: 208 | Disposition: A | Discharge status: Home / Self Care | Payer: PPO | Attending: Internal Medicine | Admitting: Internal Medicine

## 2016-12-17 ENCOUNTER — Emergency Department (HOSPITAL_COMMUNITY): Payer: PPO

## 2016-12-17 DIAGNOSIS — J810 Acute pulmonary edema: Secondary | ICD-10-CM | POA: Diagnosis not present

## 2016-12-17 DIAGNOSIS — J449 Chronic obstructive pulmonary disease, unspecified: Secondary | ICD-10-CM

## 2016-12-17 DIAGNOSIS — R0602 Shortness of breath: Secondary | ICD-10-CM | POA: Diagnosis not present

## 2016-12-17 DIAGNOSIS — R05 Cough: Secondary | ICD-10-CM

## 2016-12-17 DIAGNOSIS — J9622 Acute and chronic respiratory failure with hypercapnia: Secondary | ICD-10-CM | POA: Diagnosis not present

## 2016-12-17 DIAGNOSIS — Z8249 Family history of ischemic heart disease and other diseases of the circulatory system: Secondary | ICD-10-CM | POA: Diagnosis not present

## 2016-12-17 DIAGNOSIS — J9690 Respiratory failure, unspecified, unspecified whether with hypoxia or hypercapnia: Secondary | ICD-10-CM

## 2016-12-17 DIAGNOSIS — R059 Cough, unspecified: Secondary | ICD-10-CM

## 2016-12-17 DIAGNOSIS — J9621 Acute and chronic respiratory failure with hypoxia: Secondary | ICD-10-CM | POA: Diagnosis not present

## 2016-12-17 DIAGNOSIS — K5900 Constipation, unspecified: Secondary | ICD-10-CM | POA: Diagnosis present

## 2016-12-17 DIAGNOSIS — T426X5A Adverse effect of other antiepileptic and sedative-hypnotic drugs, initial encounter: Secondary | ICD-10-CM | POA: Diagnosis not present

## 2016-12-17 DIAGNOSIS — R451 Restlessness and agitation: Secondary | ICD-10-CM | POA: Diagnosis not present

## 2016-12-17 DIAGNOSIS — I10 Essential (primary) hypertension: Secondary | ICD-10-CM | POA: Diagnosis present

## 2016-12-17 DIAGNOSIS — I480 Paroxysmal atrial fibrillation: Secondary | ICD-10-CM | POA: Diagnosis not present

## 2016-12-17 DIAGNOSIS — I959 Hypotension, unspecified: Secondary | ICD-10-CM | POA: Diagnosis present

## 2016-12-17 DIAGNOSIS — R443 Hallucinations, unspecified: Secondary | ICD-10-CM | POA: Diagnosis not present

## 2016-12-17 DIAGNOSIS — J9602 Acute respiratory failure with hypercapnia: Secondary | ICD-10-CM

## 2016-12-17 DIAGNOSIS — R0600 Dyspnea, unspecified: Secondary | ICD-10-CM | POA: Diagnosis present

## 2016-12-17 DIAGNOSIS — F17210 Nicotine dependence, cigarettes, uncomplicated: Secondary | ICD-10-CM | POA: Diagnosis present

## 2016-12-17 DIAGNOSIS — R0902 Hypoxemia: Secondary | ICD-10-CM

## 2016-12-17 DIAGNOSIS — E871 Hypo-osmolality and hyponatremia: Secondary | ICD-10-CM | POA: Diagnosis present

## 2016-12-17 DIAGNOSIS — Z9289 Personal history of other medical treatment: Secondary | ICD-10-CM

## 2016-12-17 DIAGNOSIS — I2510 Atherosclerotic heart disease of native coronary artery without angina pectoris: Secondary | ICD-10-CM | POA: Diagnosis not present

## 2016-12-17 DIAGNOSIS — J441 Chronic obstructive pulmonary disease with (acute) exacerbation: Secondary | ICD-10-CM | POA: Diagnosis not present

## 2016-12-17 DIAGNOSIS — Z95828 Presence of other vascular implants and grafts: Secondary | ICD-10-CM

## 2016-12-17 DIAGNOSIS — N183 Chronic kidney disease, stage 3 (moderate): Secondary | ICD-10-CM | POA: Diagnosis not present

## 2016-12-17 DIAGNOSIS — N179 Acute kidney failure, unspecified: Secondary | ICD-10-CM | POA: Diagnosis present

## 2016-12-17 DIAGNOSIS — D696 Thrombocytopenia, unspecified: Secondary | ICD-10-CM | POA: Diagnosis present

## 2016-12-17 DIAGNOSIS — E785 Hyperlipidemia, unspecified: Secondary | ICD-10-CM | POA: Diagnosis present

## 2016-12-17 DIAGNOSIS — I739 Peripheral vascular disease, unspecified: Secondary | ICD-10-CM | POA: Diagnosis not present

## 2016-12-17 DIAGNOSIS — F419 Anxiety disorder, unspecified: Secondary | ICD-10-CM | POA: Diagnosis present

## 2016-12-17 DIAGNOSIS — I5032 Chronic diastolic (congestive) heart failure: Secondary | ICD-10-CM | POA: Diagnosis present

## 2016-12-17 DIAGNOSIS — I5033 Acute on chronic diastolic (congestive) heart failure: Secondary | ICD-10-CM | POA: Diagnosis not present

## 2016-12-17 DIAGNOSIS — I130 Hypertensive heart and chronic kidney disease with heart failure and stage 1 through stage 4 chronic kidney disease, or unspecified chronic kidney disease: Secondary | ICD-10-CM | POA: Diagnosis not present

## 2016-12-17 DIAGNOSIS — E872 Acidosis: Secondary | ICD-10-CM | POA: Diagnosis present

## 2016-12-17 DIAGNOSIS — F10239 Alcohol dependence with withdrawal, unspecified: Secondary | ICD-10-CM | POA: Diagnosis not present

## 2016-12-17 DIAGNOSIS — Z7901 Long term (current) use of anticoagulants: Secondary | ICD-10-CM

## 2016-12-17 DIAGNOSIS — D638 Anemia in other chronic diseases classified elsewhere: Secondary | ICD-10-CM | POA: Diagnosis present

## 2016-12-17 DIAGNOSIS — J9600 Acute respiratory failure, unspecified whether with hypoxia or hypercapnia: Secondary | ICD-10-CM

## 2016-12-17 DIAGNOSIS — Z79899 Other long term (current) drug therapy: Secondary | ICD-10-CM

## 2016-12-17 DIAGNOSIS — J811 Chronic pulmonary edema: Secondary | ICD-10-CM

## 2016-12-17 DIAGNOSIS — J849 Interstitial pulmonary disease, unspecified: Secondary | ICD-10-CM | POA: Diagnosis not present

## 2016-12-17 DIAGNOSIS — Z955 Presence of coronary angioplasty implant and graft: Secondary | ICD-10-CM

## 2016-12-17 DIAGNOSIS — Z7982 Long term (current) use of aspirin: Secondary | ICD-10-CM

## 2016-12-17 DIAGNOSIS — Z9981 Dependence on supplemental oxygen: Secondary | ICD-10-CM

## 2016-12-17 DIAGNOSIS — J9601 Acute respiratory failure with hypoxia: Secondary | ICD-10-CM | POA: Diagnosis present

## 2016-12-17 HISTORY — DX: Alcohol dependence, uncomplicated: F10.20

## 2016-12-17 HISTORY — DX: Chronic obstructive pulmonary disease, unspecified: J44.9

## 2016-12-17 LAB — BASIC METABOLIC PANEL WITH GFR
Anion gap: 9 (ref 5–15)
BUN: 14 mg/dL (ref 6–20)
CO2: 24 mmol/L (ref 22–32)
Calcium: 8.8 mg/dL — ABNORMAL LOW (ref 8.9–10.3)
Chloride: 98 mmol/L — ABNORMAL LOW (ref 101–111)
Creatinine, Ser: 1.58 mg/dL — ABNORMAL HIGH (ref 0.61–1.24)
GFR calc Af Amer: 47 mL/min — ABNORMAL LOW
GFR calc non Af Amer: 41 mL/min — ABNORMAL LOW
Glucose, Bld: 127 mg/dL — ABNORMAL HIGH (ref 65–99)
Potassium: 3.9 mmol/L (ref 3.5–5.1)
Sodium: 131 mmol/L — ABNORMAL LOW (ref 135–145)

## 2016-12-17 LAB — CBC WITH DIFFERENTIAL/PLATELET
Basophils Absolute: 0 10*3/uL (ref 0.0–0.1)
Basophils Relative: 0 %
Eosinophils Absolute: 0.1 10*3/uL (ref 0.0–0.7)
Eosinophils Relative: 1 %
HCT: 38.3 % — ABNORMAL LOW (ref 39.0–52.0)
Hemoglobin: 12.9 g/dL — ABNORMAL LOW (ref 13.0–17.0)
Lymphocytes Relative: 9 %
Lymphs Abs: 0.8 10*3/uL (ref 0.7–4.0)
MCH: 30.2 pg (ref 26.0–34.0)
MCHC: 33.7 g/dL (ref 30.0–36.0)
MCV: 89.7 fL (ref 78.0–100.0)
Monocytes Absolute: 0.6 10*3/uL (ref 0.1–1.0)
Monocytes Relative: 8 %
Neutro Abs: 6.9 10*3/uL (ref 1.7–7.7)
Neutrophils Relative %: 82 %
Platelets: 132 10*3/uL — ABNORMAL LOW (ref 150–400)
RBC: 4.27 MIL/uL (ref 4.22–5.81)
RDW: 16.7 % — ABNORMAL HIGH (ref 11.5–15.5)
WBC: 8.4 10*3/uL (ref 4.0–10.5)

## 2016-12-17 LAB — I-STAT TROPONIN, ED: Troponin i, poc: 0 ng/mL (ref 0.00–0.08)

## 2016-12-17 LAB — BRAIN NATRIURETIC PEPTIDE: B Natriuretic Peptide: 187.4 pg/mL — ABNORMAL HIGH (ref 0.0–100.0)

## 2016-12-17 MED ORDER — FOLIC ACID 1 MG PO TABS
1000.0000 ug | ORAL_TABLET | Freq: Every day | ORAL | Status: DC
  Administered 2016-12-18: 1 mg via ORAL
  Filled 2016-12-17 (×3): qty 1

## 2016-12-17 MED ORDER — NITROGLYCERIN 0.4 MG SL SUBL: 0.4000 mg | SUBLINGUAL_TABLET | SUBLINGUAL | Status: DC | PRN

## 2016-12-17 MED ORDER — AZITHROMYCIN 500 MG PO TABS
500.0000 mg | ORAL_TABLET | Freq: Every day | ORAL | Status: AC
  Administered 2016-12-17: 500 mg via ORAL
  Filled 2016-12-17: qty 1

## 2016-12-17 MED ORDER — FUROSEMIDE 10 MG/ML IJ SOLN
40.0000 mg | Freq: Once | INTRAMUSCULAR | Status: AC
  Administered 2016-12-17: 40 mg via INTRAVENOUS
  Filled 2016-12-17: qty 4

## 2016-12-17 MED ORDER — METOPROLOL TARTRATE 25 MG PO TABS
25.0000 mg | ORAL_TABLET | Freq: Once | ORAL | Status: AC
  Administered 2016-12-17: 25 mg via ORAL
  Filled 2016-12-17: qty 1

## 2016-12-17 MED ORDER — ALBUTEROL (5 MG/ML) CONTINUOUS INHALATION SOLN
15.0000 mg/h | INHALATION_SOLUTION | Freq: Once | RESPIRATORY_TRACT | Status: AC
Start: 2016-12-17 — End: 2016-12-17
  Administered 2016-12-17: 15 mg/h via RESPIRATORY_TRACT
  Filled 2016-12-17: qty 20

## 2016-12-17 MED ORDER — ALBUTEROL SULFATE (2.5 MG/3ML) 0.083% IN NEBU
2.5000 mg | INHALATION_SOLUTION | RESPIRATORY_TRACT | Status: DC | PRN
  Administered 2016-12-18: 2.5 mg via RESPIRATORY_TRACT
  Filled 2016-12-17: qty 3

## 2016-12-17 MED ORDER — IPRATROPIUM BROMIDE 0.02 % IN SOLN
1.0000 mg | Freq: Once | RESPIRATORY_TRACT | Status: AC
  Administered 2016-12-17: 1 mg via RESPIRATORY_TRACT
  Filled 2016-12-17: qty 5

## 2016-12-17 MED ORDER — METOPROLOL TARTRATE 5 MG/5ML IV SOLN
5.0000 mg | Freq: Once | INTRAVENOUS | Status: AC
  Administered 2016-12-17: 5 mg via INTRAVENOUS
  Filled 2016-12-17: qty 5

## 2016-12-17 MED ORDER — LOSARTAN POTASSIUM 25 MG PO TABS
25.0000 mg | ORAL_TABLET | Freq: Every day | ORAL | Status: DC
  Administered 2016-12-18: 25 mg via ORAL
  Filled 2016-12-17 (×3): qty 1

## 2016-12-17 MED ORDER — ATORVASTATIN CALCIUM 40 MG PO TABS
40.0000 mg | ORAL_TABLET | Freq: Every day | ORAL | Status: DC
  Administered 2016-12-17 – 2016-12-18 (×2): 40 mg via ORAL
  Filled 2016-12-17 (×2): qty 1

## 2016-12-17 MED ORDER — ENOXAPARIN SODIUM 40 MG/0.4ML ~~LOC~~ SOLN: 40.0000 mg | SUBCUTANEOUS | Status: DC

## 2016-12-17 MED ORDER — ACETAMINOPHEN 325 MG PO TABS: 650.0000 mg | ORAL_TABLET | Freq: Four times a day (QID) | ORAL | Status: DC | PRN

## 2016-12-17 MED ORDER — METOPROLOL TARTRATE 12.5 MG HALF TABLET
12.5000 mg | ORAL_TABLET | Freq: Two times a day (BID) | ORAL | Status: DC
  Administered 2016-12-17 – 2016-12-18 (×3): 12.5 mg via ORAL
  Filled 2016-12-17 (×3): qty 1

## 2016-12-17 MED ORDER — ALBUTEROL SULFATE (2.5 MG/3ML) 0.083% IN NEBU: 5.0000 mg | INHALATION_SOLUTION | Freq: Once | RESPIRATORY_TRACT | Status: DC

## 2016-12-17 MED ORDER — METHYLPREDNISOLONE SODIUM SUCC 125 MG IJ SOLR
125.0000 mg | Freq: Once | INTRAMUSCULAR | Status: AC
Start: 2016-12-17 — End: 2016-12-17
  Administered 2016-12-17: 125 mg via INTRAVENOUS
  Filled 2016-12-17: qty 2

## 2016-12-17 MED ORDER — AZITHROMYCIN 250 MG PO TABS
250.0000 mg | ORAL_TABLET | Freq: Every day | ORAL | Status: DC
  Administered 2016-12-18: 250 mg via ORAL
  Filled 2016-12-17 (×3): qty 1

## 2016-12-17 MED ORDER — FUROSEMIDE 40 MG PO TABS
40.0000 mg | ORAL_TABLET | Freq: Every day | ORAL | Status: DC
  Administered 2016-12-18: 40 mg via ORAL
  Filled 2016-12-17 (×2): qty 1

## 2016-12-17 MED ORDER — FAMOTIDINE 10 MG PO TABS
10.0000 mg | ORAL_TABLET | Freq: Every day | ORAL | Status: DC
  Administered 2016-12-18: 10 mg via ORAL
  Filled 2016-12-17 (×3): qty 1

## 2016-12-17 MED ORDER — SODIUM CHLORIDE 0.9% FLUSH
3.0000 mL | Freq: Two times a day (BID) | INTRAVENOUS | Status: DC
  Administered 2016-12-17 – 2016-12-21 (×9): 3 mL via INTRAVENOUS
  Administered 2016-12-22: 10 mL via INTRAVENOUS
  Administered 2016-12-22 – 2016-12-24 (×4): 3 mL via INTRAVENOUS

## 2016-12-17 MED ORDER — PREDNISONE 20 MG PO TABS
40.0000 mg | ORAL_TABLET | Freq: Every day | ORAL | Status: DC
  Administered 2016-12-18: 40 mg via ORAL
  Filled 2016-12-17 (×3): qty 2

## 2016-12-17 MED ORDER — IPRATROPIUM-ALBUTEROL 0.5-2.5 (3) MG/3ML IN SOLN
3.0000 mL | Freq: Four times a day (QID) | RESPIRATORY_TRACT | Status: DC
  Administered 2016-12-17 – 2016-12-18 (×4): 3 mL via RESPIRATORY_TRACT
  Filled 2016-12-17 (×5): qty 3

## 2016-12-17 MED ORDER — ACETAMINOPHEN 650 MG RE SUPP: 650.0000 mg | Freq: Four times a day (QID) | RECTAL | Status: DC | PRN

## 2016-12-17 MED ORDER — APIXABAN 5 MG PO TABS
5.0000 mg | ORAL_TABLET | Freq: Two times a day (BID) | ORAL | Status: DC
  Administered 2016-12-17 – 2016-12-18 (×3): 5 mg via ORAL
  Filled 2016-12-17 (×4): qty 1

## 2016-12-17 MED ORDER — ASPIRIN EC 81 MG PO TBEC
81.0000 mg | DELAYED_RELEASE_TABLET | Freq: Every day | ORAL | Status: DC
  Administered 2016-12-18: 81 mg via ORAL
  Filled 2016-12-17 (×3): qty 1

## 2016-12-17 NOTE — ED Triage Notes (Signed)
Pt from home. Normally on 1L of O2 via [place] at home, but SpO2 in mid-80s on same here. Pt states he woke up around 0200 with SOB. Reports he took an extra sleeping pill to help him rest, and believes he may have overdosed on it.

## 2016-12-17 NOTE — ED Notes (Signed)
Attempted report 

## 2016-12-17 NOTE — Progress Notes (Signed)
Patient and family requesting to talk to the doctor. Dr. Lovena Le notified  immediately responded and speak to the family and patient at the bedside.

## 2016-12-17 NOTE — Discharge Instructions (Signed)

## 2016-12-17 NOTE — Progress Notes (Signed)
Patient alert and responsive. No signs and symptoms of respiratory distress noted. Patient sitting in the chair at this time. Patient's spouse at bedside. Denies any pain. Will continue to monitor.

## 2016-12-17 NOTE — ED Provider Notes (Signed)
TIME SEEN: 6:36 AM  CHIEF COMPLAINT: Shortness of breath  HPI: Patient is a 76 year old male with history of CAD status post stent, hypertension, dyslipidemia, CKD, atrial fibrillation on Eliquis, CHF on Lasix, COPD on 1 L nasal cannula at home with continued tobacco use who presents to the emergency department with worsening shortness of breath that started last night. Shortness of breath is worse with lying flat and with exertion. He has been wheezing. Reports dry cough for the past 2-3 days. No fevers or chills. No chest pain or chest discomfort. No increased leg swelling for baseline. He does report that his dry weight is normally between 196-198 pounds and recently his weight 200 pounds. He has double up on his Lasix 40 mg to try to help with this.  PCP - Dr. Helene Kelp at [facility] in [place] - Dr. Gwenlyn Found   Echo November 2017:  Study Conclusions  - Left ventricle: The cavity size was normal. Wall thickness was   increased in a pattern of moderate LVH. Systolic function was   normal. The estimated ejection fraction was in the range of 55%   to 60%. Wall motion was normal; there were no regional wall   motion abnormalities. - Aortic valve: There was trivial regurgitation. - Mitral valve: Mildly calcified annulus. - Right atrium: The atrium was mildly dilated.   ROS: See HPI Constitutional: no fever  Eyes: no drainage  ENT: no runny nose   Cardiovascular:  no chest pain  Resp: SOB  GI: no vomiting GU: no dysuria Integumentary: no rash  Allergy: no hives  Musculoskeletal: no leg swelling  Neurological: no slurred speech ROS otherwise negative  PAST MEDICAL HISTORY/PAST SURGICAL HISTORY:  Past Medical History:  Diagnosis Date  . CAD in native artery 06/2000   BMS to the RCA; known 60% mid dominant RCA stenosis and 50-60% OM 2 branch stenosis normal LV function. Last Myoview May 2011 negative for ischemia.  . Dyslipidemia, goal LDL below 70   .  Edema   . Hypertension   . PAD (peripheral artery disease) (HCC)    iliac, SCA, Innominate, and renal PTA  . Tobacco abuse     MEDICATIONS:  Prior to Admission medications   Medication Sig Start Date End Date Taking? Authorizing Provider  albuterol (PROVENTIL HFA;VENTOLIN HFA) 108 (90 Base) MCG/ACT inhaler Inhale 2 puffs into the lungs every 4 (four) hours as needed for wheezing or shortness of breath. 06/24/16   Lajean Saver, MD  amLODipine (NORVASC) 5 MG tablet Take 5 mg by mouth daily.    [provider]  apixaban (ELIQUIS) 5 MG TABS tablet Take 1 tablet (5 mg total) by mouth 2 (two) times daily. 06/23/16   Erlene Quan, PA-C  aspirin EC 81 MG tablet Take 81 mg by mouth daily.    [provider]  atorvastatin (LIPITOR) 40 MG tablet Take 40 mg by mouth daily at 6 PM.  12/26/12   [provider]  folic acid (FOLVITE) 1 MG tablet Take 800 mcg by mouth daily.     [provider]  furosemide (LASIX) 40 MG tablet Take 2 tablets (80 mg total) by mouth daily. 05/30/16   Erlene Quan, PA-C  Garlic 2778 MG CAPS Take 1 capsule by mouth daily.     [provider]  losartan (COZAAR) 25 MG tablet Take 1 tablet (25 mg total) by mouth daily. 07/20/16 10/18/16  Lorretta Harp, MD  metoprolol tartrate (LOPRESSOR) 25 MG tablet Take  1/2 tablet twice a day 07/20/16   Lorretta Harp, MD  nitroGLYCERIN (NITROSTAT) 0.4 MG SL tablet Place 0.4 mg under the tongue every 5 (five) minutes as needed for chest pain.    [provider]  Omega-3 Fatty Acids (FISH OIL) 300 MG CAPS Take 1,200 mg by mouth daily.     [provider]  Polyethylene Glycol 3350 (MIRALAX PO) Take 17 g by mouth daily.     [provider]  PROAIR HFA 108 [phone] Base) MCG/ACT inhaler Inhale 2 puffs into the lungs every 6 (six) hours as needed. 05/12/16   [provider]  pyridOXINE (VITAMIN B-6) 50 MG tablet Take 50 mg by mouth daily.    [provider]   ranitidine (ZANTAC) 75 MG tablet Take 75 mg by mouth daily.    [provider]  SPIRIVA HANDIHALER 18 MCG inhalation capsule Place 18 mcg into inhaler and inhale daily.  02/07/13   [provider]  vitamin E (VITAMIN E) 400 UNIT capsule Take 400 Units by mouth daily.    [provider]  Wheat Dextrin (BENEFIBER PO) Take by mouth daily. 4 teaspoons    [provider]  zolpidem (AMBIEN) 10 MG tablet Take 10 mg by mouth at bedtime as needed for sleep.    [provider]    ALLERGIES:  No Known Allergies  SOCIAL HISTORY:  Social History  Substance Use Topics  . Smoking status: Current Every Day Smoker    Packs/day: 1.00    Types: Cigarettes  . Smokeless tobacco: Never Used     Comment: also electronic cigarettes  . Alcohol use 12.6 oz/week    21 Cans of beer per week     Comment: beer    FAMILY HISTORY: Family History  Problem Relation Age of Onset  . Heart disease Mother        hx CABG  . Heart disease Father        hx of CABG  . Cancer Sister   . Heart attack Brother   . Heart disease Brother     EXAM: BP (!) 147/72 (BP Location: Left Arm)   Pulse (!) 102   Temp 98.1 F (36.7 C) (Oral)   Resp (!) 27   Ht 5\' 7"  (1.702 m)   Wt 93.7 kg (206 lb 9.1 oz)   SpO2 96%   BMI 32.35 kg/m  CONSTITUTIONAL: Alert and oriented and responds appropriately to questions. Elderly, chronically ill-appearing HEAD: Normocephalic EYES: Conjunctivae clear, pupils appear equal, EOMI ENT: normal nose; moist mucous membranes NECK: Supple, no meningismus, no nuchal rigidity, no LAD  CARD: Irregularly irregular and tachycardic; S1 and S2 appreciated; no murmurs, no clicks, no rubs, no gallops RESP: Patient is tachypneic, sats in the low 80s on his normal 1 L nasal cannula, diminished aeration at bases bilaterally, diffuse expiratory wheezes, no rhonchi or rales, speaking short sentences, mild increased work of breathing ABD/GI: Normal bowel sounds;  non-distended; soft, non-tender, no rebound, no guarding, no peritoneal signs, no hepatosplenomegaly BACK:  The back appears normal and is non-tender to palpation, there is no CVA tenderness EXT: Normal ROM in all joints; non-tender to palpation; patient does have nonpitting edema in bilateral feet and ankles; normal capillary refill; no cyanosis, no calf tenderness or swelling    SKIN: Normal color for age and race; warm; no rash NEURO: Moves all extremities equally PSYCH: The patient's mood and manner are appropriate. Grooming and personal hygiene are appropriate.  MEDICAL DECISION MAKING:  Patient here with shortness of breath. Suspect COPD exacerbation versus CHF exacerbation. No fever or productive cough but pneumonia is also on the differential. He denies chest pain or chest discomfort. Currently in atrial fibrillation which appears to has a documented history.  On Eliquis. He is on metoprolol. Heart rate is in the 110s here.  I feel patient will need albuterol, Atrovent and Solu-Medrol. We'll also give dose of IV metoprolol here. Will obtain cardiac labs, chest x-ray.    ED PROGRESS: 7:30 AM  I have reevaluated the patient and he is still having some mild increased work of breathing, diffuse expiratory wheezing and speaking short sentences. I feel he needs admission. History pain and is negative. He has no leukocytosis. He has chronic and stable kidney disease. His hemoglobin is baseline. His chest x-ray does show mild pulmonary edema at his lung bases but has improved compared to December 2017. He has stable cardiomegaly. I have given him a dose of IV Lasix as well. Have recommended admission for continued treatment for pulmonary edema and COPD exacerbation and patient agrees with this plan. We'll discuss with medicine for admission. His PCP is Dr. Helene Kelp in [place].  7:37 AM Discussed patient's case with internal medicine resident. I have recommended admission and patient (and family if present)  agree with this plan. Admitting physician will place admission orders.    Patient's heart rate is going up into the 130s. We'll give him a second dose of IV metoprolol. I think this is secondary to albuterol however this time I feel he needs to stay on his CAT given his respiratory status. I will order his home dose of metoprolol as well. Unfortunately we have a Producer, television/film/video of diltiazem at this time.    I reviewed all nursing notes, vitals, pertinent previous records, EKGs, lab and urine results, imaging (as available).     EKG Interpretation  Date/Time:  Saturday December 17 2016 06:16:08 EDT Ventricular Rate:  113 PR Interval:    QRS Duration: 87 QT Interval:  334 QTC Calculation: 458 R Axis:   73 Text Interpretation:  Atrial fibrillation Anterior infarct, old Nonspecific repol abnormality, lateral leads Confirmed by Naif Alabi, Cyril Mourning [phone]) on 12/17/2016 6:19:17 AM         Krithik Mapel, Delice Bison, DO 12/17/16 6060

## 2016-12-17 NOTE — H&P (Signed)
Date: 12/17/2016               Patient Name:  Kevin Watkins MRN: [id number]  DOB: 1941-07-02 Age / Sex: 76 y.o., male   PCP: Ronita Hipps, MD         Medical Service: Internal Medicine Teaching Service         Attending Physician: Dr. Aldine Contes, MD    First Contact: Dr. Florence Canner Pager: [phone]  Second Contact: Dr. Zada Finders Pager: [phone]       After Hours (After 5p/  First Contact Pager: [phone]  weekends / holidays): Second Contact Pager: [phone]   Chief Complaint: Shortness of Breath   History of Present Illness:  The patient is a 76 year old Caucasian male with a past medical history of COPD, HFpEF (EF: 55-60%), coronary artery disease status post stent, peripheral vascular disease with renal and subclavian stents, atrial fibrillation, hypertension and bilateral carotid disease who presents with several weeks of minimally increased dyspnea on exertion and an acute increase of cough, shortness of breath and sputum production of 48 hours.  He says he has had increasing dyspnea on exertion and weight gain over the previous several weeks. He doubled his Lasix dose but does not have relief. He has chronic orthopnea at night. He was unable to clarify how many pillows he needs to sleep. Despite his increase of dyspnea on exertion this did not bring in to the emergency department. He states that approximate 48 hours ago he developed worsening shortness of breath, cough and increased sputum production. His wife recently recovered from a sinus infection. He denies chest pain. He denies nausea, vomiting or abdominal pain. He denies fevers or chills. He denies polyuria or dysuria. He denies constipation or diarrhea.  In the emergency department the patient was minimally hypertensive and tachycardic. He was afebrile. CBC largely unremarkable except for an isolated thrombocytopenia. Basic metabolic panel showed hyponatremia with a sodium of 131 and a creatinine of 1.58. BNP  elevated to 187.4. I-STAT troponin negative. Chest x-ray with mild pulmonary edema at the lung bases. EKG was in a regular rhythm consistent with atrial fibrillation. The patient was given Solu-Medrol, nebulizers and Lasix in the emergency department and IMTS was called for admission.   Meds:  Current Meds  Medication Sig  . albuterol (PROVENTIL HFA;VENTOLIN HFA) 108 (90 Base) MCG/ACT inhaler Inhale 2 puffs into the lungs every 4 (four) hours as needed for wheezing or shortness of breath.  [name] apixaban (ELIQUIS) 5 MG TABS tablet Take 1 tablet (5 mg total) by mouth 2 (two) times daily.  [name] aspirin EC 81 MG tablet Take 81 mg by mouth daily.  [name] atorvastatin (LIPITOR) 40 MG tablet Take 40 mg by mouth daily at 6 PM.   . folic acid (FOLVITE) 1 MG tablet Take 800 mcg by mouth daily.   . furosemide (LASIX) 40 MG tablet Take 2 tablets (80 mg total) by mouth daily. (Patient taking differently: Take 40 mg by mouth See admin instructions. Take 40 mg everyday if weight gain over 3 lbs take an extra tablet in the afternoon)  . Garlic 5284 MG CAPS Take 1 capsule by mouth daily.   [name] ipratropium-albuterol (DUONEB) 0.5-2.5 (3) MG/3ML SOLN Take 3 mLs by nebulization 4 (four) times daily as needed (shortness of breath).  . losartan (COZAAR) 25 MG tablet Take 1 tablet (25 mg total) by mouth daily.  . metoprolol tartrate (LOPRESSOR) 25 MG tablet Take 1/2 tablet twice a day  .  nitroGLYCERIN (NITROSTAT) 0.4 MG SL tablet Place 0.4 mg under the tongue every 5 (five) minutes as needed for chest pain.  . Omega-3 Fatty Acids (FISH OIL) 300 MG CAPS Take 2,400 mg by mouth daily.   . Polyethylene Glycol 3350 (MIRALAX PO) Take 17 g by mouth daily.   [name] PROAIR HFA 108 (90 Base) MCG/ACT inhaler Inhale 2 puffs into the lungs every 6 (six) hours as needed for wheezing or shortness of breath.   . ranitidine (ZANTAC) 75 MG tablet Take 75 mg by mouth daily.  . vitamin E (VITAMIN E) 400 UNIT capsule Take 400 Units by mouth daily.  [name]  zolpidem (AMBIEN) 10 MG tablet Take 10 mg by mouth at bedtime as needed for sleep.     Allergies: Allergies as of 12/17/2016  . (No Known Allergies)   Past Medical History:  Diagnosis Date  . CAD in native artery 06/2000   BMS to the RCA; known 60% mid dominant RCA stenosis and 50-60% OM 2 branch stenosis normal LV function. Last Myoview May 2011 negative for ischemia.  . Dyslipidemia, goal LDL below 70   . Edema   . Hypertension   . PAD (peripheral artery disease) (HCC)    iliac, SCA, Innominate, and renal PTA  . Tobacco abuse     Family History:  Family History  Problem Relation Age of Onset  . Heart disease Mother        hx CABG  . Heart disease Father        hx of CABG  . Cancer Sister   . Heart attack Brother   . Heart disease Brother      Social History:  Social History   Social History  . Marital status: Married    Spouse name: N/A  . Number of children: N/A  . Years of education: N/A   Occupational History  . Not on file.   Social History Main Topics  . Smoking status: Current Every Day Smoker    Packs/day: 1.00    Types: Cigarettes  . Smokeless tobacco: Never Used     Comment: also electronic cigarettes  . Alcohol use 12.6 oz/week    21 Cans of beer per week     Comment: beer  . Drug use: No  . Sexual activity: Not on file   Other Topics Concern  . Not on file   Social History Narrative  . No narrative on file     Review of Systems: A complete ROS was negative except as per HPI.   Physical Exam: Blood pressure (!) 141/71, pulse (!) 110, temperature 98.1 F (36.7 C), temperature source Oral, resp. rate (!) 27, height 5\' 7"  (1.702 m), weight (S) 206 lb 9.1 oz (93.7 kg), SpO2 98 %. Physical Exam  Constitutional: He is oriented to person, place, and time. He appears well-developed and well-nourished.  HENT:  Head: Normocephalic and atraumatic.  Cardiovascular:  No murmur heard. Tachycardic, Irregular rhythm    Respiratory: He has  wheezes.  Tachypnea, prominent bilateral expiratory wheeze  GI: He exhibits distension. There is no tenderness.  Abdomen very distended but no tenderness to palpation.  Musculoskeletal: He exhibits no edema.  Neurological: He is alert and oriented to person, place, and time.     EKG:  irregular rhythm. No acute ST segment elevation  CXR:  pulmonary edema  Assessment & Plan by Problem: Active Problems:   Dyspnea  # Shortness of breath 2/2 COPD exacerbation  ## COPD Patient presents with an  acute increase in shortness of breath. I think his picture is mixed with some component of volume overload with chronic heart failure and COPD exacerbation. The patient says his shortness of breath acutely worsened approximate 48 hours ago and was associated with increased cough and sputum production. Pulmonary examination shows prominent bilateral wheezing. I think the patient was volume overloaded from his heart failure but acutely decompensated secondary to COPD exacerbation. He was given Solu-Medrol in the emergency department. We will start prednisone 40 mg tomorrow and continue this for 5 days. We'll also start azithromycin given increase in sputum production and cough. He'll also schedule duo nebs. -- DuoNeb's every 6 hours -- Albuterol every 4 hours as needed -- Prednisone 40 mg 5 days -- Azithromycin  # Acute HFpEF exacerbation ## PAF ## HTN ## CAD s/p PCI ## PAD The patient has a complicated past cardiac and vascular history including coronary artery disease status post stenting, peripheral arterial disease status post multiple stents, hypertension, paroxysmal atrial fibrillation and heart failure with preserved ejection fraction. At presentation the patient's weight was up. Chest x-ray also showed bibasilar pulmonary edema. I do suspect the patient has some component of volume overload contributing to his acute presentation. We have given him IV Lasix in the emergency department and we'll  give a second dose tonight. We'll switch to oral Lasix tomorrow. We will continue his other medications. -- IV furosemide 40 mg tonight -- Furosemide 40 mg oral twice a day starting tomorrow -- Aspirin 81 mg daily -- Apixaban 5 mg BID -- Atorvastatin 40 mg daily -- Losartan 25 mg daily -- Metoprolol 2.5 mg twice a day  DVT/PE prophylaxis: Therapeutically anticoagulated FEN/GI: Regular diet Code: Full code    Dispo: Admit patient to Observation with expected length of stay less than 2 midnights.  Signed: Ophelia Shoulder, MD 12/17/2016, 9:04 AM  Pager: [phone]

## 2016-12-17 NOTE — Progress Notes (Signed)
Patient arrived in the unit accompanied by NT via stretcher. Orientation to the unit given. Patient verbalizes understanding. 

## 2016-12-18 ENCOUNTER — Observation Stay (HOSPITAL_COMMUNITY): Payer: PPO

## 2016-12-18 ENCOUNTER — Encounter (HOSPITAL_COMMUNITY): Payer: Self-pay | Admitting: *Deleted

## 2016-12-18 DIAGNOSIS — Z4682 Encounter for fitting and adjustment of non-vascular catheter: Secondary | ICD-10-CM | POA: Diagnosis not present

## 2016-12-18 DIAGNOSIS — J811 Chronic pulmonary edema: Secondary | ICD-10-CM | POA: Diagnosis not present

## 2016-12-18 DIAGNOSIS — J9621 Acute and chronic respiratory failure with hypoxia: Secondary | ICD-10-CM | POA: Diagnosis not present

## 2016-12-18 DIAGNOSIS — I5032 Chronic diastolic (congestive) heart failure: Secondary | ICD-10-CM

## 2016-12-18 DIAGNOSIS — F419 Anxiety disorder, unspecified: Secondary | ICD-10-CM | POA: Diagnosis not present

## 2016-12-18 DIAGNOSIS — R0600 Dyspnea, unspecified: Secondary | ICD-10-CM | POA: Diagnosis not present

## 2016-12-18 DIAGNOSIS — F10239 Alcohol dependence with withdrawal, unspecified: Secondary | ICD-10-CM | POA: Diagnosis not present

## 2016-12-18 DIAGNOSIS — J9690 Respiratory failure, unspecified, unspecified whether with hypoxia or hypercapnia: Secondary | ICD-10-CM | POA: Diagnosis not present

## 2016-12-18 DIAGNOSIS — D638 Anemia in other chronic diseases classified elsewhere: Secondary | ICD-10-CM | POA: Diagnosis not present

## 2016-12-18 DIAGNOSIS — E871 Hypo-osmolality and hyponatremia: Secondary | ICD-10-CM | POA: Diagnosis not present

## 2016-12-18 DIAGNOSIS — I10 Essential (primary) hypertension: Secondary | ICD-10-CM | POA: Diagnosis not present

## 2016-12-18 DIAGNOSIS — J9601 Acute respiratory failure with hypoxia: Secondary | ICD-10-CM | POA: Diagnosis not present

## 2016-12-18 DIAGNOSIS — E785 Hyperlipidemia, unspecified: Secondary | ICD-10-CM | POA: Diagnosis not present

## 2016-12-18 DIAGNOSIS — I480 Paroxysmal atrial fibrillation: Secondary | ICD-10-CM | POA: Diagnosis not present

## 2016-12-18 DIAGNOSIS — J810 Acute pulmonary edema: Secondary | ICD-10-CM | POA: Diagnosis not present

## 2016-12-18 DIAGNOSIS — R451 Restlessness and agitation: Secondary | ICD-10-CM | POA: Diagnosis not present

## 2016-12-18 DIAGNOSIS — I5033 Acute on chronic diastolic (congestive) heart failure: Secondary | ICD-10-CM | POA: Diagnosis not present

## 2016-12-18 DIAGNOSIS — I959 Hypotension, unspecified: Secondary | ICD-10-CM | POA: Diagnosis not present

## 2016-12-18 DIAGNOSIS — E872 Acidosis: Secondary | ICD-10-CM | POA: Diagnosis not present

## 2016-12-18 DIAGNOSIS — J449 Chronic obstructive pulmonary disease, unspecified: Secondary | ICD-10-CM

## 2016-12-18 DIAGNOSIS — I2510 Atherosclerotic heart disease of native coronary artery without angina pectoris: Secondary | ICD-10-CM | POA: Diagnosis not present

## 2016-12-18 DIAGNOSIS — I4891 Unspecified atrial fibrillation: Secondary | ICD-10-CM | POA: Diagnosis not present

## 2016-12-18 DIAGNOSIS — N183 Chronic kidney disease, stage 3 (moderate): Secondary | ICD-10-CM | POA: Diagnosis not present

## 2016-12-18 DIAGNOSIS — F17210 Nicotine dependence, cigarettes, uncomplicated: Secondary | ICD-10-CM | POA: Diagnosis not present

## 2016-12-18 DIAGNOSIS — J849 Interstitial pulmonary disease, unspecified: Secondary | ICD-10-CM | POA: Diagnosis not present

## 2016-12-18 DIAGNOSIS — D696 Thrombocytopenia, unspecified: Secondary | ICD-10-CM | POA: Diagnosis not present

## 2016-12-18 DIAGNOSIS — I739 Peripheral vascular disease, unspecified: Secondary | ICD-10-CM | POA: Diagnosis not present

## 2016-12-18 DIAGNOSIS — N179 Acute kidney failure, unspecified: Secondary | ICD-10-CM | POA: Diagnosis not present

## 2016-12-18 DIAGNOSIS — J441 Chronic obstructive pulmonary disease with (acute) exacerbation: Principal | ICD-10-CM

## 2016-12-18 DIAGNOSIS — R0602 Shortness of breath: Secondary | ICD-10-CM | POA: Diagnosis not present

## 2016-12-18 DIAGNOSIS — J9600 Acute respiratory failure, unspecified whether with hypoxia or hypercapnia: Secondary | ICD-10-CM | POA: Diagnosis not present

## 2016-12-18 DIAGNOSIS — R443 Hallucinations, unspecified: Secondary | ICD-10-CM | POA: Diagnosis not present

## 2016-12-18 DIAGNOSIS — Z8249 Family history of ischemic heart disease and other diseases of the circulatory system: Secondary | ICD-10-CM | POA: Diagnosis not present

## 2016-12-18 DIAGNOSIS — K5900 Constipation, unspecified: Secondary | ICD-10-CM | POA: Diagnosis not present

## 2016-12-18 DIAGNOSIS — J9622 Acute and chronic respiratory failure with hypercapnia: Secondary | ICD-10-CM | POA: Diagnosis not present

## 2016-12-18 DIAGNOSIS — J9602 Acute respiratory failure with hypercapnia: Secondary | ICD-10-CM | POA: Diagnosis not present

## 2016-12-18 DIAGNOSIS — I130 Hypertensive heart and chronic kidney disease with heart failure and stage 1 through stage 4 chronic kidney disease, or unspecified chronic kidney disease: Secondary | ICD-10-CM | POA: Diagnosis not present

## 2016-12-18 LAB — CBC
HEMATOCRIT: 35.9 % — AB (ref 39.0–52.0)
HEMOGLOBIN: 12.1 g/dL — AB (ref 13.0–17.0)
MCH: 30.2 pg (ref 26.0–34.0)
MCHC: 33.7 g/dL (ref 30.0–36.0)
MCV: 89.5 fL (ref 78.0–100.0)
Platelets: 120 10*3/uL — ABNORMAL LOW (ref 150–400)
RBC: 4.01 MIL/uL — ABNORMAL LOW (ref 4.22–5.81)
RDW: 16.2 % — ABNORMAL HIGH (ref 11.5–15.5)
WBC: 6.4 10*3/uL (ref 4.0–10.5)

## 2016-12-18 LAB — BASIC METABOLIC PANEL
ANION GAP: 9 (ref 5–15)
BUN: 24 mg/dL — ABNORMAL HIGH (ref 6–20)
CO2: 26 mmol/L (ref 22–32)
Calcium: 8.7 mg/dL — ABNORMAL LOW (ref 8.9–10.3)
Chloride: 95 mmol/L — ABNORMAL LOW (ref 101–111)
Creatinine, Ser: 1.65 mg/dL — ABNORMAL HIGH (ref 0.61–1.24)
GFR calc Af Amer: 45 mL/min — ABNORMAL LOW (ref >60)
GFR calc non Af Amer: 38 mL/min — ABNORMAL LOW (ref >60)
GLUCOSE: 160 mg/dL — AB (ref 65–99)
POTASSIUM: 3.9 mmol/L (ref 3.5–5.1)
Sodium: 130 mmol/L — ABNORMAL LOW (ref 135–145)

## 2016-12-18 LAB — MRSA PCR SCREENING: MRSA by PCR: NEGATIVE

## 2016-12-18 LAB — GLUCOSE, CAPILLARY: Glucose-Capillary: 155 mg/dL — ABNORMAL HIGH (ref 65–99)

## 2016-12-18 MED ORDER — FUROSEMIDE 10 MG/ML IJ SOLN
40.0000 mg | Freq: Once | INTRAMUSCULAR | Status: AC
  Administered 2016-12-18: 40 mg via INTRAVENOUS
  Filled 2016-12-18: qty 4

## 2016-12-18 MED ORDER — IPRATROPIUM BROMIDE HFA 17 MCG/ACT IN AERS: 2.0000 | INHALATION_SPRAY | RESPIRATORY_TRACT | Status: DC

## 2016-12-18 MED ORDER — NICOTINE 21 MG/24HR TD PT24
21.0000 mg | MEDICATED_PATCH | Freq: Every day | TRANSDERMAL | Status: DC
  Administered 2016-12-19 – 2016-12-24 (×6): 21 mg via TRANSDERMAL
  Filled 2016-12-18 (×6): qty 1

## 2016-12-18 MED ORDER — FUROSEMIDE 40 MG PO TABS: 40.0000 mg | ORAL_TABLET | Freq: Two times a day (BID) | ORAL | Status: DC

## 2016-12-18 MED ORDER — ALBUTEROL SULFATE (2.5 MG/3ML) 0.083% IN NEBU
2.5000 mg | INHALATION_SOLUTION | RESPIRATORY_TRACT | Status: DC | PRN
  Administered 2016-12-19: 2.5 mg via RESPIRATORY_TRACT
  Filled 2016-12-18: qty 3

## 2016-12-18 MED ORDER — NICOTINE 14 MG/24HR TD PT24
14.0000 mg | MEDICATED_PATCH | Freq: Every day | TRANSDERMAL | Status: DC
  Administered 2016-12-18: 14 mg via TRANSDERMAL
  Filled 2016-12-18: qty 1

## 2016-12-18 MED ORDER — IPRATROPIUM BROMIDE 0.02 % IN SOLN
0.5000 mg | Freq: Four times a day (QID) | RESPIRATORY_TRACT | Status: DC
  Administered 2016-12-18 – 2016-12-21 (×12): 0.5 mg via RESPIRATORY_TRACT
  Filled 2016-12-18 (×12): qty 2.5

## 2016-12-18 MED ORDER — IPRATROPIUM BROMIDE HFA 17 MCG/ACT IN AERS: 2.0000 | INHALATION_SPRAY | Freq: Four times a day (QID) | RESPIRATORY_TRACT | Status: DC

## 2016-12-18 MED ORDER — POLYETHYLENE GLYCOL 3350 17 G PO PACK
17.0000 g | PACK | Freq: Every day | ORAL | Status: DC | PRN
  Administered 2016-12-18: 17 g via ORAL
  Filled 2016-12-18 (×2): qty 1

## 2016-12-18 MED ORDER — LEVALBUTEROL HCL 0.63 MG/3ML IN NEBU
0.6300 mg | INHALATION_SOLUTION | Freq: Four times a day (QID) | RESPIRATORY_TRACT | Status: DC
  Administered 2016-12-18 – 2016-12-21 (×12): 0.63 mg via RESPIRATORY_TRACT
  Filled 2016-12-18 (×21): qty 3

## 2016-12-18 MED ORDER — METHYLPREDNISOLONE SODIUM SUCC 125 MG IJ SOLR
60.0000 mg | Freq: Once | INTRAMUSCULAR | Status: AC
  Administered 2016-12-18: 60 mg via INTRAVENOUS
  Filled 2016-12-18: qty 2

## 2016-12-18 MED ORDER — ZOLPIDEM TARTRATE 5 MG PO TABS
5.0000 mg | ORAL_TABLET | Freq: Every evening | ORAL | Status: DC | PRN
  Administered 2016-12-18 (×2): 5 mg via ORAL
  Filled 2016-12-18 (×2): qty 1

## 2016-12-18 MED ORDER — ALPRAZOLAM 0.5 MG PO TABS
0.5000 mg | ORAL_TABLET | Freq: Once | ORAL | Status: AC
  Administered 2016-12-18: 0.5 mg via ORAL
  Filled 2016-12-18: qty 1

## 2016-12-18 NOTE — Progress Notes (Signed)
Pt increasing in work of breathing and complaining that he "can't catch his breath." RT was called and Dr Jeneen Rinks taylor put in order for bipap and stat ABG.   After 15 mins on bipap Pt appears to be tolerating well and improving in his work of breathing. He states that "he feels better."  Will continue to monitor.

## 2016-12-18 NOTE — Progress Notes (Signed)
Patient having  dyspnea at rest and wheezing . RT and MD notified. Albuterol treatment  and Prednisone given.  Four Internal medicine  MD at bedside making rounds. Family members at bedside. Denies any pain.

## 2016-12-18 NOTE — Progress Notes (Signed)
Solu-medrol given. CBG 155. RT and family members at bedside. at bedside

## 2016-12-18 NOTE — Progress Notes (Addendum)
Called to patient's bedside because patient with SOB and rapid HR. Per RN he has received 3 Albuterol treatments this am. Per Patient he is feeling better. Lung sounds with wheezes through out.  No distress or increased WOB. BP 137/65  AF 120-130s  RR 32   O2 sats 97% on 2-4l [place] Resident team at bedside.   Plan move to SDU, maintain O2 sats 88-92%, change albuterol to xopenex  And change steroids to IV  0920 Patient with acute respiratory distress, .Increased wob, using accessory muscles. Improved with repositioning and purse lip breathing.  RN giving report, assisted with transport to [phone]

## 2016-12-18 NOTE — Progress Notes (Signed)
[facility]TS ROUNDING NOTE:  S: We went back to the bedside this afternoon to check on Kevin Watkins.  He is currently on BiPap and reports his breathing feels improved at this time.  He has been evaluated by PCCM.  O: Vitals:   12/18/16 1211 12/18/16 1332  BP:    Pulse: (!) 110 (!) 112  Resp: (!) 31 (!) 27  Temp: 97.7 F (36.5 C)    General: lying in bed, on Bipap, responding appropriately HEENT: [place]/AT, EOMI, no scleral icterus Cardiac: irr irr rhythm, rate 110's Pulm: dimished at bases, with diffuse expiratory wheeze Ext: warm and well perfused, no pedal edema Neuro: alert and oriented X3, cranial nerves II-XII grossly intact  A/P:  Acute COPD Exacerbation with Possible Component of CHF Exacerbation - ABG >> pH 7.28, pCO2 53, HCO3 24 - repeat CXR - appreciate assistance and recs from our PCCM service - continue Bipap - continue SDU - monitor HR, may need better rate control - scheduled nebs, steroids, abx - further plan pending repeat CXR

## 2016-12-18 NOTE — Progress Notes (Signed)
   Subjective: No acute events overnight. However, this AM patient become short of breath requiring B-agonist therapy. Rapid response was called. When we evaluated the patient his breathing had improved. He was resting and not in respiratory distress. He was able to speak in full sentences. He told us his breathing had worsened but was already improving after the nebulizer treatment.   Objective:  Vital signs in last 24 hours: Vitals:   12/18/16 0853 12/18/16 0854 12/18/16 0900 12/18/16 0926  BP:   (!) 137/95   Pulse: (!) 47 (!) 36  (!) 124  Resp: (!) 38 (!) 36 (!) 32   Temp:      TempSrc:      SpO2: 94% 94% 91%   Weight:      Height:       Physical Exam  Constitutional: He is oriented to person, place, and time. He appears well-developed and well-nourished.  HENT:  Head: Normocephalic and atraumatic.  Nasal cannula in place  Cardiovascular:  No murmur heard. Tachycardic  Respiratory: He has wheezes.  Tachypnea, prominent bilateral expiratory wheezes appreciated throughout all lung fields. These are louder than yesterday but he also appears to be moving more volumes of air.  GI: Soft. Bowel sounds are normal. He exhibits no distension.  Musculoskeletal: He exhibits no edema.  Neurological: He is alert and oriented to person, place, and time.     Assessment/Plan:  Principal Problem:   Dyspnea Active Problems:   PAD- multiple interventions- renal, iliac, SCA   Essential hypertension   Chronic diastolic heart failure (HCC)   Paroxysmal atrial fibrillation (HCC)   ## COPD Exacerbation The patient was doing better yesterday afternoon. However, this morning he developed acute dyspnea and rapid response was called. He was given an extra nebulizer treatment with improvement of his symptoms. Pulmonary examination shows bilateral expiratory wheezing. At reevaluation the patient was speaking in full sentences and not in respiratory distress. However, we will transfer him to the  stepdown unit for closer monitoring today. We've also given him an additional dose of IV steroids. We will continue his current therapy and monitor him clinically today. -- Transfer to step-down unit -- Given extra IV dose of solumedrol 60 mg x 1 -- DuoNeb's every 6 hours -- Albuterol every 4 hours as needed -- Prednisone 40 mg 5 days -- Azithromycin  # Acute HFpEF exacerbation ## PAF ## HTN ## CAD s/p PCI ## PAD Patient does not appear volume overloaded on examination. Cr with a small bump. Will hold IV diuresis and continue with oral home lasix.  -- Furosemide 40 mg oral twice a day starting tomorrow -- Aspirin 81 mg daily -- Apixaban 5 mg BID -- Atorvastatin 40 mg daily -- Losartan 25 mg daily -- Metoprolol 2.5 mg twice a day -- Daily BMP  DVT/PE prophylaxis: Therapeutically anticoagulated FEN/GI: Regular diet Code: Full code  Dispo: Anticipated discharge in approximately 1-2 day(s).   Ophelia Shoulder, MD 12/18/2016, 9:28 AM Pager: [phone]

## 2016-12-18 NOTE — Progress Notes (Signed)
Pt with increased WOB and SOB this afternoon. BiPAP initiated. ABG ordered. PCCM consulted. Patient may need transfer to ICU level care pending res from PCCM.

## 2016-12-18 NOTE — Progress Notes (Signed)
Patient still having Dyspnea at rest.Patient HR between 103-130's.  Charge nurse, RRT and MD notified. RT , charge and RRT at bedside at this time.

## 2016-12-18 NOTE — Progress Notes (Signed)
MD recommending to transfer the patient to a higher level of care. Family member at bedside

## 2016-12-18 NOTE — Progress Notes (Signed)
Patient transferred to 3W18 due to  COPD exacerbation.   Patient accompanied by RN and RRT via bed with   O2 at 2L. Report given prior to transfer.Family at bedside.

## 2016-12-18 NOTE — Progress Notes (Signed)
Four Internal medicine MD at bedside (Dr. Lovena Le and  Dr.  Posey Pronto), RRT and RT at bedside at this time.

## 2016-12-18 NOTE — Consult Note (Signed)
Name: Kevin Watkins MRN: [id number] DOB: 1940-08-23    ADMISSION DATE:  12/17/2016 CONSULTATION DATE:  12/18/16  REFERRING MD :  Dr. Dareen Piano  CHIEF COMPLAINT:  SOB   HISTORY OF PRESENT ILLNESS:  76 y/o M who presented to [facility] on 6/9 with complaints of shortness of breath, swelling and low O2 saturations.    At baseline, the patient is on 1L O2.  He has a medical hx of CAD, Dyslipidemia, HTN, PAD and tobacco abuse (64 years, up to 2ppd, currently 1/2 pk to 1ppd).  He reports he has noted swelling over the past few weeks and has increased his lasix.  The patient reports he weighs himself daily and normally is around 196-198 lbs.  As of late, he has had difficulty lying flat due to SOB. On am of admit, he woke around 0200 with shortness of breath.  He reports he took an extra sleeping pill the evening prior and was concerned he "overdosed on it".  CXR on admit was concerning for possible edema but no overt infiltrate. Initial labs - Na 131, K 3.9, Cl 98, BUN 14, Sr Cr 1.58, BNP 187, troponin 0.00, WBC 8.4, Hgb 12.9 and platelets 132.The patient was admitted per IMTS for evaluation of possible COPD and CHF exacerbation.  On 6/10 he developed worsening shortness of breath and was transferred to SDU for evaluation on BiPAP.    PCCM consulted for evaluation.   The patient was given additional lasix and placed on BiPAP with improvement in symptoms.    PAST MEDICAL HISTORY :   has a past medical history of CAD in native artery (06/2000); Dyslipidemia, goal LDL below 70; Edema; Hypertension; PAD (peripheral artery disease) ([place]); and Tobacco abuse.   has a past surgical history that includes Coronary angioplasty with stent (06/26/2000); Renal angiogram (09/22/2005); Subclavian angiogram (07/15/2004); transthoracic echocardiogram (2013); NM MYOCAR PERF WALL MOTION (2011); Carotid Doppler (2013); Renal Doppler (2013); Upper Extremity Arterial Doppler (2013); and Angioplasty ('01, '03).  Prior to Admission  medications   Medication Sig Start Date End Date Taking? Authorizing Provider  albuterol (PROVENTIL HFA;VENTOLIN HFA) 108 (90 Base) MCG/ACT inhaler Inhale 2 puffs into the lungs every 4 (four) hours as needed for wheezing or shortness of breath. 06/24/16  Yes Lajean Saver, MD  apixaban (ELIQUIS) 5 MG TABS tablet Take 1 tablet (5 mg total) by mouth 2 (two) times daily. 06/23/16  Yes Erlene Quan, PA-C  aspirin EC 81 MG tablet Take 81 mg by mouth daily.   Yes [provider]  atorvastatin (LIPITOR) 40 MG tablet Take 40 mg by mouth daily at 6 PM.  12/26/12  Yes [provider]  folic acid (FOLVITE) 1 MG tablet Take 800 mcg by mouth daily.    Yes [provider]  furosemide (LASIX) 40 MG tablet Take 2 tablets (80 mg total) by mouth daily. Patient taking differently: Take 40 mg by mouth See admin instructions. Take 40 mg everyday if weight gain over 3 lbs take an extra tablet in the afternoon 05/30/16  Yes Kilroy, Doreene Burke, PA-C  Garlic 5284 MG CAPS Take 1 capsule by mouth daily.    Yes [provider]  ipratropium-albuterol (DUONEB) 0.5-2.5 (3) MG/3ML SOLN Take 3 mLs by nebulization 4 (four) times daily as needed (shortness of breath).   Yes [provider]  losartan (COZAAR) 25 MG tablet Take 1 tablet (25 mg total) by mouth daily. 07/20/16 12/17/16 Yes Lorretta Harp, MD  metoprolol tartrate (LOPRESSOR) 25 MG  tablet Take 1/2 tablet twice a day 07/20/16  Yes Lorretta Harp, MD  nitroGLYCERIN (NITROSTAT) 0.4 MG SL tablet Place 0.4 mg under the tongue every 5 (five) minutes as needed for chest pain.   Yes [provider]  Omega-3 Fatty Acids (FISH OIL) 300 MG CAPS Take 2,400 mg by mouth daily.    Yes [provider]  Polyethylene Glycol 3350 (MIRALAX PO) Take 17 g by mouth daily.    Yes [provider]  PROAIR HFA 108 (90 Base) MCG/ACT inhaler Inhale 2 puffs into the lungs every 6 (six) hours as needed for wheezing or shortness of  breath.  05/12/16  Yes [provider]  ranitidine (ZANTAC) 75 MG tablet Take 75 mg by mouth daily.   Yes [provider]  vitamin E (VITAMIN E) 400 UNIT capsule Take 400 Units by mouth daily.   Yes [provider]  zolpidem (AMBIEN) 10 MG tablet Take 10 mg by mouth at bedtime as needed for sleep.   Yes [provider]    No Known Allergies  FAMILY HISTORY:  family history includes Cancer in his sister; Heart attack in his brother; Heart disease in his brother, father, and mother.  SOCIAL HISTORY:  reports that he has been smoking Cigarettes.  He has been smoking about 1.00 pack per day. He has never used smokeless tobacco. He reports that he drinks about 12.6 oz of alcohol per week . He reports that he does not use drugs.  REVIEW OF SYSTEMS:  POSITIVES IN BOLD Constitutional: Negative for fever, chills, weight loss, malaise/fatigue and diaphoresis.  HENT: Negative for hearing loss, ear pain, nosebleeds, congestion, sore throat, neck pain, tinnitus and ear discharge.   Eyes: Negative for blurred vision, double vision, photophobia, pain, discharge and redness.  Respiratory: Negative for cough, hemoptysis, sputum production, shortness of breath, wheezing and stridor.   Cardiovascular: Negative for chest pain, palpitations, orthopnea, claudication, leg swelling and PND.  Gastrointestinal: Negative for heartburn, nausea, vomiting, abdominal pain, diarrhea, constipation, blood in stool and melena.  Genitourinary: Negative for dysuria, urgency, frequency, hematuria and flank pain.  Musculoskeletal: Negative for myalgias, back pain, joint pain and falls.  Skin: Negative for itching and rash.  Neurological: Negative for dizziness, tingling, tremors, sensory change, speech change, focal weakness, seizures, loss of consciousness, weakness and headaches.  Endo/Heme/Allergies: Negative for environmental allergies and polydipsia. Does not bruise/bleed  easily.  SUBJECTIVE:   VITAL SIGNS: Temp:  [97.7 F (36.5 C)-97.8 F (36.6 C)] 97.7 F (36.5 C) (06/10 1211) Pulse Rate:  [35-144] 112 (06/10 1332) Resp:  [18-38] 27 (06/10 1332) BP: (100-147)/(46-95) 107/66 (06/10 0950) SpO2:  [91 %-100 %] 99 % (06/10 1346) Weight:  [204 lb 9.6 oz (92.8 kg)-225 lb 12.8 oz (102.4 kg)] 225 lb 12.8 oz (102.4 kg) (06/10 0950)  PHYSICAL EXAMINATION: General: elderly male in NAD, lying in bed on BiPAP   HEENT: MM pink/moist, BiPAP mask in place Neuro: AAOx4, speech clear, MAE CV: s1s2 irr irr, no m/r/g, AF 120's on monitor  PULM: even/non-labored, lungs bilaterally with good air movement, faint scattered wheezing    GU:RKYH, non-tender, bsx4 active  Extremities: warm/dry, edema  Skin: no rashes or lesions    Recent Labs Lab 12/17/16 0634 12/18/16 0248  NA 131* 130*  K 3.9 3.9  CL 98* 95*  CO2 24 26  BUN 14 24*  CREATININE 1.58* 1.65*  GLUCOSE 127* 160*     Recent Labs Lab 12/17/16 0634 12/18/16 0248  HGB 12.9* 12.1*  HCT 38.3* 35.9*  WBC 8.4 6.4  PLT 132* 120*    Dg Chest Port 1 View  Result Date: 12/17/2016 CLINICAL DATA:  Awoke with shortness of breath. EXAM: PORTABLE CHEST 1 VIEW COMPARISON:  Frontal and lateral views 06/24/2016 FINDINGS: Stable cardiomegaly mediastinal contours. Atherosclerosis of the thoracic aorta. Vascular stents in the region of the right mediastinum. Decreased pleural effusions from prior exam. Decreased pulmonary edema. Residual interstitial thickening at the lung bases. No focal airspace disease. No pneumothorax. Osseous structures are grossly stable. IMPRESSION: 1. Mild pulmonary edema at the lung bases, improved from prior exam. Previous pleural effusions have resolved. 2. Stable cardiomegaly and thoracic aortic atherosclerosis. Electronically Signed   By: Jeb Levering M.D.   On: 12/17/2016 06:46      SIGNIFICANT EVENTS  6/09  Admit with COPD/CHF exacerbation   STUDIES:      ASSESSMENT /  PLAN:  Discussion:  76 y/o M with PMH of CAD, HTN, CHF & likely undiagnosed COPD with ongoing smoking who presented to [facility] on 6/9 with complaints of shortness of breath.  Initial CXR concerning for pulmonary edema.  He was admitted with possible CHF / COPD exacerbation.  Developed acute worsening of SOB on 6/10 and he was given additional lasix and transferred to SDU for BiPAP.    1.  CHF with Acute Decompensation - question validity of recorded weight  2.  Suspected COPD - no formal diagnosis, ongoing tobacco abuse 3.  Tobacco Abuse  4.  AF with RVR 5.  HTN  Plan: Continue BiPAP, cycle on 4 hours, off for 2.  Pt should likely wear overnight Follow up CXR now  Xopenex + Atrovent Q6  PRN albuterol  Solumedrol 60 mg x1 given per primary SVC > on prednisone 40 mg QD Rate control of AF Pt will need outpatient PFT's once acute illness resolved  Follow up CXR in am  Agree with Azithromycin for possible COPD exacerbation  Daily weights > obtain accurate weight once respiratory status improved Assess ambulatory O2 needs prior to discharge  Continue Eliquis for AF  Noe Gens, NP-C [facility] Pgr: [phone] or if no answer [phone] 12/18/2016, 1:59 PM   Attending Note:  I have examined patient, reviewed labs, studies and notes. I have discussed the case with B Ollis, and I agree with the data and plans as amended above. 76 year old smoker with a history of hypertension, diastolic CHF, probable COPD (no PFTs available currently) admitted with progressive dyspnea, orthopnea, reported weight gain. He had used additional Lasix as an outpatient prior to presentation without any significant improvement. He is admitted with these symptoms and wheezing, hypoxemia. He was treated for suspected COPD exacerbation with bronchodilators, corticosteroids, azithromycin. He has also been diuresed. On my evaluation he is now on BiPAP with a comfortable respiratory pattern. He has some  scattered expiratory wheezes but otherwise good air movement. No significant lower extremity edema (probably trace edema), benign abdomen, tachycardic and irregular. I suspect based on hx that a large component of this is d CHF, but certainly agree that COPD could be a factor. We will diurese, continue same rx for his COPD. Agree with BiPAP for now and prn. He appears stable on this for now. Will follow with you.   Baltazar Apo, MD, PhD 12/18/2016, 3:07 PM [facility] [phone] or if no answer [phone]

## 2016-12-19 ENCOUNTER — Inpatient Hospital Stay (HOSPITAL_COMMUNITY): Payer: PPO

## 2016-12-19 DIAGNOSIS — J9602 Acute respiratory failure with hypercapnia: Secondary | ICD-10-CM

## 2016-12-19 DIAGNOSIS — J9601 Acute respiratory failure with hypoxia: Secondary | ICD-10-CM | POA: Diagnosis present

## 2016-12-19 LAB — BLOOD GAS, ARTERIAL
ACID-BASE DEFICIT: 1.3 mmol/L (ref 0.0–2.0)
ACID-BASE DEFICIT: 0.2 mmol/L (ref 0.0–2.0)
Acid-Base Excess: 0.1 mmol/L (ref 0.0–2.0)
BICARBONATE: 27.7 mmol/L (ref 20.0–28.0)
Bicarbonate: 24.4 mmol/L (ref 20.0–28.0)
Bicarbonate: 26.1 mmol/L (ref 20.0–28.0)
DRAWN BY: 28338
DRAWN BY: 236041
Delivery systems: POSITIVE
Delivery systems: POSITIVE
Expiratory PAP: 5
Expiratory PAP: 6
FIO2: 0.4
FIO2: 40
FIO2: 50
Inspiratory PAP: 10
Inspiratory PAP: 12
LHR: 8 {breaths}/min
LHR: 15 {breaths}/min
Mode: POSITIVE
O2 SAT: 97.5 %
O2 SAT: 99.2 %
O2 Saturation: 93.2 %
PCO2 ART: 53 mmHg — AB (ref 32.0–48.0)
PCO2 ART: 80.7 mmHg — AB (ref 32.0–48.0)
PEEP/CPAP: 5 cmH2O
PH ART: 7.285 — AB (ref 7.350–7.450)
Patient temperature: 98.6
Patient temperature: 98.6
Patient temperature: 98.1
VT: 600 mL
pCO2 arterial: 56.7 mmHg — ABNORMAL HIGH (ref 32.0–48.0)
pH, Arterial: 7.161 — CL (ref 7.350–7.450)
pH, Arterial: 7.283 — ABNORMAL LOW (ref 7.350–7.450)
pO2, Arterial: 114 mmHg — ABNORMAL HIGH (ref 83.0–108.0)
pO2, Arterial: 82.5 mmHg — ABNORMAL LOW (ref 83.0–108.0)
pO2, Arterial: 200 mmHg — ABNORMAL HIGH (ref 83.0–108.0)

## 2016-12-19 LAB — GLUCOSE, CAPILLARY
GLUCOSE-CAPILLARY: 110 mg/dL — AB (ref 65–99)
Glucose-Capillary: 133 mg/dL — ABNORMAL HIGH (ref 65–99)
Glucose-Capillary: 107 mg/dL — ABNORMAL HIGH (ref 65–99)
Glucose-Capillary: 132 mg/dL — ABNORMAL HIGH (ref 65–99)
Glucose-Capillary: 87 mg/dL (ref 65–99)

## 2016-12-19 LAB — BASIC METABOLIC PANEL WITH GFR
Anion gap: 8 (ref 5–15)
BUN: 34 mg/dL — ABNORMAL HIGH (ref 6–20)
CO2: 26 mmol/L (ref 22–32)
Calcium: 8.5 mg/dL — ABNORMAL LOW (ref 8.9–10.3)
Chloride: 95 mmol/L — ABNORMAL LOW (ref 101–111)
Creatinine, Ser: 1.51 mg/dL — ABNORMAL HIGH (ref 0.61–1.24)
GFR calc Af Amer: 50 mL/min — ABNORMAL LOW
GFR calc non Af Amer: 43 mL/min — ABNORMAL LOW
Glucose, Bld: 156 mg/dL — ABNORMAL HIGH (ref 65–99)
Potassium: 4.2 mmol/L (ref 3.5–5.1)
Sodium: 129 mmol/L — ABNORMAL LOW (ref 135–145)

## 2016-12-19 LAB — TRIGLYCERIDES: TRIGLYCERIDES: 114 mg/dL (ref <150)

## 2016-12-19 MED ORDER — FUROSEMIDE 40 MG PO TABS: 40.0000 mg | ORAL_TABLET | Freq: Two times a day (BID) | ORAL | Status: DC

## 2016-12-19 MED ORDER — SODIUM CHLORIDE 0.9 % IV SOLN
0.0000 ug/min | INTRAVENOUS | Status: DC
  Administered 2016-12-19: 50 ug/min via INTRAVENOUS
  Administered 2016-12-19: 25 ug/min via INTRAVENOUS
  Administered 2016-12-20 (×2): 40 ug/min via INTRAVENOUS
  Filled 2016-12-19 (×6): qty 1

## 2016-12-19 MED ORDER — RAMELTEON 8 MG PO TABS
8.0000 mg | ORAL_TABLET | Freq: Every evening | ORAL | Status: DC | PRN
  Filled 2016-12-19: qty 1

## 2016-12-19 MED ORDER — MIDAZOLAM HCL 2 MG/2ML IJ SOLN
1.0000 mg | INTRAMUSCULAR | Status: DC | PRN
  Administered 2016-12-20 – 2016-12-21 (×3): 1 mg via INTRAVENOUS
  Filled 2016-12-19 (×3): qty 2

## 2016-12-19 MED ORDER — SUCCINYLCHOLINE CHLORIDE 20 MG/ML IJ SOLN
100.0000 mg | Freq: Once | INTRAMUSCULAR | Status: AC
  Administered 2016-12-19: 100 mg via INTRAVENOUS

## 2016-12-19 MED ORDER — APIXABAN 5 MG PO TABS
5.0000 mg | ORAL_TABLET | Freq: Two times a day (BID) | ORAL | Status: DC
  Administered 2016-12-19 – 2016-12-23 (×9): 5 mg
  Filled 2016-12-19 (×9): qty 1

## 2016-12-19 MED ORDER — FUROSEMIDE 10 MG/ML IJ SOLN
40.0000 mg | Freq: Once | INTRAMUSCULAR | Status: AC
  Administered 2016-12-19: 40 mg via INTRAVENOUS
  Filled 2016-12-19: qty 4

## 2016-12-19 MED ORDER — ETOMIDATE 2 MG/ML IV SOLN
0.3000 mg/kg | Freq: Once | INTRAVENOUS | Status: AC
  Administered 2016-12-19: 27.18 mg via INTRAVENOUS
  Filled 2016-12-19: qty 13.59

## 2016-12-19 MED ORDER — FUROSEMIDE 40 MG PO TABS
40.0000 mg | ORAL_TABLET | Freq: Two times a day (BID) | ORAL | Status: DC
  Filled 2016-12-19: qty 1

## 2016-12-19 MED ORDER — MIDAZOLAM HCL 2 MG/2ML IJ SOLN: 1.0000 mg | INTRAMUSCULAR | Status: DC | PRN

## 2016-12-19 MED ORDER — LOSARTAN POTASSIUM 25 MG PO TABS
25.0000 mg | ORAL_TABLET | Freq: Every day | ORAL | Status: DC
  Filled 2016-12-19 (×2): qty 1

## 2016-12-19 MED ORDER — PREDNISONE 20 MG PO TABS
40.0000 mg | ORAL_TABLET | Freq: Every day | ORAL | Status: AC
  Administered 2016-12-20 – 2016-12-21 (×2): 40 mg
  Filled 2016-12-19 (×2): qty 2

## 2016-12-19 MED ORDER — DEXTROSE 5 % IV SOLN
250.0000 mg | INTRAVENOUS | Status: AC
  Administered 2016-12-19 – 2016-12-21 (×3): 250 mg via INTRAVENOUS
  Filled 2016-12-19 (×3): qty 250

## 2016-12-19 MED ORDER — FENTANYL CITRATE (PF) 100 MCG/2ML IJ SOLN
50.0000 ug | Freq: Once | INTRAMUSCULAR | Status: AC
  Administered 2016-12-19: 50 ug via INTRAVENOUS
  Filled 2016-12-19: qty 2

## 2016-12-19 MED ORDER — VECURONIUM BROMIDE 10 MG IV SOLR
10.0000 mg | Freq: Once | INTRAVENOUS | Status: AC
  Administered 2016-12-19: 10 mg via INTRAVENOUS

## 2016-12-19 MED ORDER — FENTANYL BOLUS VIA INFUSION
25.0000 ug | INTRAVENOUS | Status: DC | PRN
  Administered 2016-12-19 – 2016-12-20 (×2): 25 ug via INTRAVENOUS
  Filled 2016-12-19: qty 25

## 2016-12-19 MED ORDER — CHLORHEXIDINE GLUCONATE 0.12% ORAL RINSE (MEDLINE KIT)
15.0000 mL | Freq: Two times a day (BID) | OROMUCOSAL | Status: DC
  Administered 2016-12-19 – 2016-12-22 (×7): 15 mL via OROMUCOSAL

## 2016-12-19 MED ORDER — POLYETHYLENE GLYCOL 3350 17 G PO PACK
17.0000 g | PACK | Freq: Every day | ORAL | Status: DC | PRN
  Filled 2016-12-19: qty 1

## 2016-12-19 MED ORDER — PROPOFOL 1000 MG/100ML IV EMUL
5.0000 ug/kg/min | INTRAVENOUS | Status: DC
  Administered 2016-12-19: 10 ug/kg/min via INTRAVENOUS
  Filled 2016-12-19: qty 100

## 2016-12-19 MED ORDER — SODIUM CHLORIDE 0.9 % IV SOLN
250.0000 mL | INTRAVENOUS | Status: DC | PRN
  Administered 2016-12-22: 250 mL via INTRAVENOUS

## 2016-12-19 MED ORDER — FENTANYL 2500MCG IN NS 250ML (10MCG/ML) PREMIX INFUSION
25.0000 ug/h | INTRAVENOUS | Status: DC
  Administered 2016-12-19: 25 ug/h via INTRAVENOUS
  Administered 2016-12-19: 50 ug/h via INTRAVENOUS
  Administered 2016-12-19: 25 ug/h via INTRAVENOUS
  Administered 2016-12-20: 125 ug/h via INTRAVENOUS
  Administered 2016-12-20: 200 ug/h via INTRAVENOUS
  Administered 2016-12-20: 25 ug/h via INTRAVENOUS
  Administered 2016-12-21: 300 ug/h via INTRAVENOUS
  Filled 2016-12-19 (×4): qty 250

## 2016-12-19 MED ORDER — VECURONIUM BROMIDE 10 MG IV SOLR
INTRAVENOUS | Status: AC
  Filled 2016-12-19: qty 10

## 2016-12-19 MED ORDER — ATORVASTATIN CALCIUM 40 MG PO TABS
40.0000 mg | ORAL_TABLET | Freq: Every day | ORAL | Status: DC
  Administered 2016-12-19 – 2016-12-22 (×3): 40 mg
  Filled 2016-12-19 (×4): qty 1

## 2016-12-19 MED ORDER — ORAL CARE MOUTH RINSE
15.0000 mL | Freq: Four times a day (QID) | OROMUCOSAL | Status: DC
  Administered 2016-12-19 – 2016-12-21 (×9): 15 mL via OROMUCOSAL

## 2016-12-19 MED ORDER — METOPROLOL TARTRATE 12.5 MG HALF TABLET
12.5000 mg | ORAL_TABLET | Freq: Two times a day (BID) | ORAL | Status: DC
  Administered 2016-12-19 – 2016-12-21 (×5): 12.5 mg
  Filled 2016-12-19 (×8): qty 1

## 2016-12-19 MED ORDER — FOLIC ACID 1 MG PO TABS
1000.0000 ug | ORAL_TABLET | Freq: Every day | ORAL | Status: DC
  Administered 2016-12-19 – 2016-12-23 (×5): 1 mg
  Filled 2016-12-19 (×5): qty 1

## 2016-12-19 MED ORDER — FAMOTIDINE 20 MG PO TABS
10.0000 mg | ORAL_TABLET | Freq: Every day | ORAL | Status: DC
  Administered 2016-12-19 – 2016-12-23 (×5): 10 mg
  Filled 2016-12-19 (×5): qty 1

## 2016-12-19 MED ORDER — ASPIRIN 81 MG PO CHEW
81.0000 mg | CHEWABLE_TABLET | Freq: Every day | ORAL | Status: DC
  Administered 2016-12-19 – 2016-12-23 (×5): 81 mg
  Filled 2016-12-19 (×4): qty 1

## 2016-12-19 MED ORDER — FUROSEMIDE 10 MG/ML IJ SOLN
60.0000 mg | Freq: Every day | INTRAMUSCULAR | Status: DC
  Administered 2016-12-19: 60 mg via INTRAVENOUS
  Filled 2016-12-19 (×2): qty 6

## 2016-12-19 NOTE — Progress Notes (Signed)
Dr. Wynetta Emery notified of pt's increased confusion and decreased responsiveness.  Order received for stat ABG and portable CXR.  Will continue to monitor.  Kevin Watkins

## 2016-12-19 NOTE — Consult Note (Signed)
           [facility] CM Primary Care Navigator  12/19/2016  MAYLON SAILORS 1940/12/26 [id number]   Went to see patient at the bedside earlier today to identify possible discharge needs but staff reports that he was transferred to a higher level of care due to respiratory distress (58M 15).  Will attempt to meet with patient at another time when he is out of ICU.  For questions, please contact:  Dannielle Huh, BSN, RN- [facility] Primary Care Navigator  Telephone: [phone] [facility]

## 2016-12-19 NOTE — Progress Notes (Signed)
Name: Kevin Watkins MRN: [id number] DOB: 04-Jul-1941    ADMISSION DATE:  12/17/2016 CONSULTATION DATE:  12/18/16  REFERRING MD :  Dr. Dareen Piano  CHIEF COMPLAINT:  SOB   HISTORY OF PRESENT ILLNESS:  76 y/o M who presented to [facility] on 6/9 with complaints of shortness of breath, swelling and low O2 saturations.    At baseline, the patient is on 1L O2.  He has a medical hx of CAD, Dyslipidemia, HTN, PAD and tobacco abuse (64 years, up to 2ppd, currently 1/2 pk to 1ppd).  He reports he has noted swelling over the past few weeks and has increased his lasix.  The patient reports he weighs himself daily and normally is around 196-198 lbs.  As of late, he has had difficulty lying flat due to SOB. On am of admit, he woke around 0200 with shortness of breath.  He reports he took an extra sleeping pill the evening prior and was concerned he "overdosed on it".  CXR on admit was concerning for possible edema but no overt infiltrate. Initial labs - Na 131, K 3.9, Cl 98, BUN 14, Sr Cr 1.58, BNP 187, troponin 0.00, WBC 8.4, Hgb 12.9 and platelets 132.The patient was admitted per IMTS for evaluation of possible COPD and CHF exacerbation.  On 6/10 he developed worsening shortness of breath and was transferred to SDU for evaluation on BiPAP.    PCCM consulted for evaluation.   The patient was given additional lasix and placed on BiPAP with improvement in symptoms.    SUBJECTIVE / interval history:  Patient seen on BiPAP for most of the afternoon 6/10. He came off BiPAP last night because he was "doing well" he received Ambien. This morning showed evidence of hypercapnic respiratory failure. He was moved to the intensive care unit, intubated, propofol started for sedation. He has been quite awake, agitated. When sedation increased he becomes hypotensive  VITAL SIGNS: Temp:  [98 F (36.7 C)-98.3 F (36.8 C)] 98.3 F (36.8 C) (06/11 1148) Pulse Rate:  [32-130] 130 (06/11 0900) Resp:  [14-33] 15 (06/11 0900) BP:  (65-172)/(43-87) 74/47 (06/11 0900) SpO2:  [95 %-100 %] 98 % (06/11 0900) FiO2 (%):  [40 %-50 %] 50 % (06/11 0800) Weight:  [90.6 kg (199 lb 11.2 oz)] 90.6 kg (199 lb 11.2 oz) (06/11 0412)  PHYSICAL EXAMINATION: General: elderly male in NAD, intubated, uncomfortable  HEENT: ET tube in place Neuro: Sedated, moving spontaneously and purposefully but not following commands CV: Irregularly irregular, heart rate 100s, no murmur PULM: Faint scattered expiratory wheezes GI: Soft, benign Extremities: No significant edema Skin: No rash    Recent Labs Lab 12/17/16 0634 12/18/16 0248 12/19/16 0255  NA 131* 130* 129*  K 3.9 3.9 4.2  CL 98* 95* 95*  CO2 24 26 26   BUN 14 24* 34*  CREATININE 1.58* 1.65* 1.51*  GLUCOSE 127* 160* 156*     Recent Labs Lab 12/17/16 0634 12/18/16 0248  HGB 12.9* 12.1*  HCT 38.3* 35.9*  WBC 8.4 6.4  PLT 132* 120*    Dg Chest Port 1 View  Result Date: 12/19/2016 CLINICAL DATA:  Intubated patient, acute respiratory failure, COPD, chronic CHF, peripheral vascular disease. EXAM: PORTABLE CHEST 1 VIEW COMPARISON:  Portable chest x-ray of December 19, 2016 at 4:59 a.m. FINDINGS: The patient has undergone interval intubation of the trachea and esophagus. The endotracheal tube tip lies 5 cm above the carina. The esophagogastric tube tip projects below the inferior margin of the image. The heart  and pulmonary vascularity are normal. There is calcification in the wall of the thoracic aorta. The lung markings are coarse at both bases greatest on the right. IMPRESSION: COPD. Improved pulmonary interstitial edema. Probable bibasilar subsegmental atelectasis greatest on the right. No significant pleural effusion and no pneumothorax. The support tubes are in reasonable position. Thoracic aortic atherosclerosis. Electronically Signed   By: David  Martinique M.D.   On: 12/19/2016 07:47   Dg Chest Port 1 View  Result Date: 12/19/2016 CLINICAL DATA:  Initial evaluation for acute  hypoxia, dyspnea. EXAM: PORTABLE CHEST 1 VIEW COMPARISON:  Prior radiograph from 12/18/2016. FINDINGS: Moderate cardiomegaly, stable. Mediastinal silhouette within normal limits. Aortic atherosclerosis. Lungs normally inflated. Slight interval worsening in diffuse pulmonary vascular congestion with diffuse interstitial prominence, suggesting worsened pulmonary interstitial edema. Underlying COPD. No definite focal infiltrates, although superimposed infection at the right lung base would be difficult to exclude. No obvious pleural effusion. No pneumothorax. No acute osseus abnormality. IMPRESSION: 1. Slight interval worsening in diffuse pulmonary vascular congestion with interstitial prominence, suggesting worsened pulmonary interstitial edema. 2. Underlying COPD. 3. Cardiomegaly with aortic atherosclerosis. Electronically Signed   By: Jeannine Boga M.D.   On: 12/19/2016 05:13   Dg Chest Port 1 View  Result Date: 12/18/2016 CLINICAL DATA:  Shortness breath today. EXAM: PORTABLE CHEST 1 VIEW COMPARISON:  12/17/2016. FINDINGS: The cardiac silhouette is mildly enlarged with mild improvement. Mild decrease in prominence of the pulmonary vasculature and interstitial markings. No airspace consolidation. Possible minimal left pleural effusion. IMPRESSION: Improved mild changes of congestive heart failure superimposed on COPD. Electronically Signed   By: Claudie Revering M.D.   On: 12/18/2016 16:42      SIGNIFICANT EVENTS  6/09  Admit with COPD/CHF exacerbation   STUDIES:      ASSESSMENT / PLAN:  Discussion:  76 y/o M with PMH of CAD, HTN, CHF & likely undiagnosed COPD with ongoing smoking who presented to [facility] on 6/9 with complaints of shortness of breath.  Initial CXR concerning for pulmonary edema.  He was admitted with possible CHF / COPD exacerbations.  Developed acute worsening of SOB on 6/10 and he was given additional lasix and transferred to SDU for BiPAP.  Early morning 6/11 he received Ambien.  On the morning of 6/11 he showed evidence for hypercapnic respiratory failure and was intubated.  1.  Diastolic CHF with Acute Decompensation 2.  Suspected COPD - unclear whether there is an acute exacerbation but he has been treated for this 3.  Tobacco Abuse  4.  AF with RVR 5.  HTN  Plan: Currently intubated and sedated. Balancing his level of sedation with blood pressure has been difficult. I will change him to fentanyl plus percent pushes as needed. If he cannot be managed with this level of sedation then he will either need to be extubated (which may be possible) or started on pressor support to allow [place] to maintain adequate sedation Continue current bronchodilators Continue current prednisone although unclear that he has bronchospasm currently Diuresis - increasing changed IV Lasix; follow daily weights and renal function Rate control for atrial fibrillation Eliquis for atrial fibrillation Continue azithromycin as ordered Follow chest x-ray If he's not going to be extubated in the next 24 hours then we will start tube feeding  Additional critical care time 40 minutes  Baltazar Apo, MD, PhD 12/19/2016, 12:11 PM [facility] [phone] or if no answer [phone]

## 2016-12-19 NOTE — Progress Notes (Signed)
Pt with slight confusion, increased work of breathing and use of accessory muscles, tachypnea, decreased O2 sats (88%), and audible wheezing on 40% venturi mask.  Carrie, RT notified, and pt placed back on Bipap and given a neb treatment.  Dr. Wynetta Emery updated on pt's status.  2V CXR this am per MD.  Will continue to monitor.  Jodell Cipro

## 2016-12-19 NOTE — Progress Notes (Signed)
Pt transferred to ICU, 70M15, per Dr. Dossie Arbour order.  Transported via bed, accompanied by this RN and Morey Hummingbird, RT on Bi-pap.  Pt's family updated and escorted to 2nd floor waiting area.  Bedside report given to 70M nurse.  Dr. Ancil Linsey, RT, and 70M nursing staff at pt's bedside.  Jodell Cipro

## 2016-12-19 NOTE — Procedures (Signed)
Endotracheal Intubation Procedure Note  Indication for endotracheal intubation: respiratory failure. Airway Assessment: Mallampati Class: IV (only hard palate visible). Sedation: etomidate. Paralytic: succinylcholine. Lidocaine: no. Atropine: no. Equipment: Macintosh 3 laryngoscope blade and 7.62mm cuffed endotracheal tube. Cricoid Pressure: no. Number of attempts: 1. ETT location confirmed by by auscultation and ETCO2 monitor.  Pacen Watford, Marjory Lies 12/19/2016

## 2016-12-19 NOTE — Progress Notes (Addendum)
Called for decreased responsiveness, change in respiratory status. Kevin Watkins was been weaned off BiPAP earlier in the evening around 8PM (RR 27) and placed back on around 2AM for desaturations. He had received a dose of Ambien around 930PM and awoken 3 hours later with mild confusion. However now not responding to questions or interacting with RN/family.  Stat ABG and CXR ordered  On exam the patient is breathing rapidly (RR 34) on BIPAP with accessory muscle use, staring vacantly. Tele showing Afib RVR ranging from 110 to 140s. He is oriented only to self, not following commands. Coarse rhonchi and bilaterally with expiratory wheezing in left lung field. Obese abdomen, no grimace to palpation. Legs with trace edema, extremities warm and well-perfused.   ABG concerning with pH 7.16, pCO 80.7, pO2 80.   CXR showing slight worsening of diffuse pulm vasc congestion / edema  ABG may be reflecting his recent failed trial off BiPAP, although he has been back on for the past 3 hours. Will recheck ABG in 1 hour and call PCCM

## 2016-12-19 NOTE — Consult Note (Signed)
Name: Kevin Watkins MRN: [id number] DOB: 1940-10-03    ADMISSION DATE:  12/17/2016 CONSULTATION DATE:  12/18/16  REFERRING MD :  Dr. Dareen Piano  CHIEF COMPLAINT:  SOB  HISTORY OF PRESENT ILLNESS:  See Note from Dr. Lamonte Sakai dated 6/10.  Overnight, the patient was given a dose of Ambien, and became more somnolent with less respiratory effort. He was off BiPAP for some time during the night, and then placed back on at 2:00 AM. At 5:00, a repeat ABG showed significantly worse hypercarbia. He was transferred to the ICU with plans for intubation.  PAST MEDICAL HISTORY :   has a past medical history of CAD in native artery (06/2000); Dyslipidemia, goal LDL below 70; Edema; Hypertension; PAD (peripheral artery disease) ([place]); and Tobacco abuse.   has a past surgical history that includes Coronary angioplasty with stent (06/26/2000); Renal angiogram (09/22/2005); Subclavian angiogram (07/15/2004); transthoracic echocardiogram (2013); NM MYOCAR PERF WALL MOTION (2011); Carotid Doppler (2013); Renal Doppler (2013); Upper Extremity Arterial Doppler (2013); and Angioplasty ('01, '03).  Prior to Admission medications   Medication Sig Start Date End Date Taking? Authorizing Provider  albuterol (PROVENTIL HFA;VENTOLIN HFA) 108 (90 Base) MCG/ACT inhaler Inhale 2 puffs into the lungs every 4 (four) hours as needed for wheezing or shortness of breath. 06/24/16  Yes Lajean Saver, MD  apixaban (ELIQUIS) 5 MG TABS tablet Take 1 tablet (5 mg total) by mouth 2 (two) times daily. 06/23/16  Yes Erlene Quan, PA-C  aspirin EC 81 MG tablet Take 81 mg by mouth daily.   Yes [provider]  atorvastatin (LIPITOR) 40 MG tablet Take 40 mg by mouth daily at 6 PM.  12/26/12  Yes [provider]  folic acid (FOLVITE) 1 MG tablet Take 800 mcg by mouth daily.    Yes [provider]  furosemide (LASIX) 40 MG tablet Take 2 tablets (80 mg total) by mouth daily. Patient taking differently: Take 40 mg by mouth  See admin instructions. Take 40 mg everyday if weight gain over 3 lbs take an extra tablet in the afternoon 05/30/16  Yes Kilroy, Doreene Burke, PA-C  Garlic 9924 MG CAPS Take 1 capsule by mouth daily.    Yes [provider]  ipratropium-albuterol (DUONEB) 0.5-2.5 (3) MG/3ML SOLN Take 3 mLs by nebulization 4 (four) times daily as needed (shortness of breath).   Yes [provider]  losartan (COZAAR) 25 MG tablet Take 1 tablet (25 mg total) by mouth daily. 07/20/16 12/17/16 Yes Lorretta Harp, MD  metoprolol tartrate (LOPRESSOR) 25 MG tablet Take 1/2 tablet twice a day 07/20/16  Yes Lorretta Harp, MD  nitroGLYCERIN (NITROSTAT) 0.4 MG SL tablet Place 0.4 mg under the tongue every 5 (five) minutes as needed for chest pain.   Yes [provider]  Omega-3 Fatty Acids (FISH OIL) 300 MG CAPS Take 2,400 mg by mouth daily.    Yes [provider]  Polyethylene Glycol 3350 (MIRALAX PO) Take 17 g by mouth daily.    Yes [provider]  PROAIR HFA 108 (90 Base) MCG/ACT inhaler Inhale 2 puffs into the lungs every 6 (six) hours as needed for wheezing or shortness of breath.  05/12/16  Yes [provider]  ranitidine (ZANTAC) 75 MG tablet Take 75 mg by mouth daily.   Yes [provider]  vitamin E (VITAMIN E) 400 UNIT capsule Take 400 Units by mouth daily.   Yes [provider]  zolpidem (AMBIEN) 10 MG tablet Take 10 mg  by mouth at bedtime as needed for sleep.   Yes [provider]    No Known Allergies  FAMILY HISTORY:  family history includes Cancer in his sister; Heart attack in his brother; Heart disease in his brother, father, and mother.  SOCIAL HISTORY:  reports that he has been smoking Cigarettes.  He has been smoking about 1.00 pack per day. He has never used smokeless tobacco. He reports that he drinks about 12.6 oz of alcohol per week . He reports that he does not use drugs.  REVIEW OF SYSTEMS:  POSITIVES IN  BOLD Constitutional: Negative for fever, chills, weight loss, malaise/fatigue and diaphoresis.  HENT: Negative for hearing loss, ear pain, nosebleeds, congestion, sore throat, neck pain, tinnitus and ear discharge.   Eyes: Negative for blurred vision, double vision, photophobia, pain, discharge and redness.  Respiratory: Negative for cough, hemoptysis, sputum production, shortness of breath, wheezing and stridor.   Cardiovascular: Negative for chest pain, palpitations, orthopnea, claudication, leg swelling and PND.  Gastrointestinal: Negative for heartburn, nausea, vomiting, abdominal pain, diarrhea, constipation, blood in stool and melena.  Genitourinary: Negative for dysuria, urgency, frequency, hematuria and flank pain.  Musculoskeletal: Negative for myalgias, back pain, joint pain and falls.  Skin: Negative for itching and rash.  Neurological: Negative for dizziness, tingling, tremors, sensory change, speech change, focal weakness, seizures, loss of consciousness, weakness and headaches.  Endo/Heme/Allergies: Negative for environmental allergies and polydipsia. Does not bruise/bleed easily.  SUBJECTIVE:   VITAL SIGNS: Temp:  [97.7 F (36.5 C)-98.1 F (36.7 C)] 98.1 F (36.7 C) (06/11 0412) Pulse Rate:  [32-144] 110 (06/11 0412) Resp:  [20-38] 26 (06/11 0412) BP: (100-157)/(65-95) 150/84 (06/11 0412) SpO2:  [91 %-100 %] 99 % (06/11 0412) FiO2 (%):  [40 %] 40 % (06/11 0412) Weight:  [199 lb 11.2 oz (90.6 kg)-225 lb 12.8 oz (102.4 kg)] 199 lb 11.2 oz (90.6 kg) (06/11 0412)  PHYSICAL EXAMINATION: General: elderly male in NAD, lying in bed on BiPAP   HEENT: MM pink/moist, BiPAP mask in place Neuro: AAOx2, speech slow CV: s1s2 irr irr, no m/r/g, AF 120's on monitor  PULM: even/non-labored, lungs bilaterally with good air movement, faint scattered wheezing    QQ:IWLN, non-tender, bsx4 active  Extremities: warm/dry, edema  Skin: no rashes or lesions    Recent Labs Lab  12/17/16 0634 12/18/16 0248 12/19/16 0255  NA 131* 130* 129*  K 3.9 3.9 4.2  CL 98* 95* 95*  CO2 24 26 26   BUN 14 24* 34*  CREATININE 1.58* 1.65* 1.51*  GLUCOSE 127* 160* 156*     Recent Labs Lab 12/17/16 0634 12/18/16 0248  HGB 12.9* 12.1*  HCT 38.3* 35.9*  WBC 8.4 6.4  PLT 132* 120*    Dg Chest Port 1 View  Result Date: 12/19/2016 CLINICAL DATA:  Initial evaluation for acute hypoxia, dyspnea. EXAM: PORTABLE CHEST 1 VIEW COMPARISON:  Prior radiograph from 12/18/2016. FINDINGS: Moderate cardiomegaly, stable. Mediastinal silhouette within normal limits. Aortic atherosclerosis. Lungs normally inflated. Slight interval worsening in diffuse pulmonary vascular congestion with diffuse interstitial prominence, suggesting worsened pulmonary interstitial edema. Underlying COPD. No definite focal infiltrates, although superimposed infection at the right lung base would be difficult to exclude. No obvious pleural effusion. No pneumothorax. No acute osseus abnormality. IMPRESSION: 1. Slight interval worsening in diffuse pulmonary vascular congestion with interstitial prominence, suggesting worsened pulmonary interstitial edema. 2. Underlying COPD. 3. Cardiomegaly with aortic atherosclerosis. Electronically Signed   By: Jeannine Boga M.D.   On: 12/19/2016 05:13  Dg Chest Port 1 View  Result Date: 12/18/2016 CLINICAL DATA:  Shortness breath today. EXAM: PORTABLE CHEST 1 VIEW COMPARISON:  12/17/2016. FINDINGS: The cardiac silhouette is mildly enlarged with mild improvement. Mild decrease in prominence of the pulmonary vasculature and interstitial markings. No airspace consolidation. Possible minimal left pleural effusion. IMPRESSION: Improved mild changes of congestive heart failure superimposed on COPD. Electronically Signed   By: Claudie Revering M.D.   On: 12/18/2016 16:42   Dg Chest Port 1 View  Result Date: 12/17/2016 CLINICAL DATA:  Awoke with shortness of breath. EXAM: PORTABLE CHEST 1  VIEW COMPARISON:  Frontal and lateral views 06/24/2016 FINDINGS: Stable cardiomegaly mediastinal contours. Atherosclerosis of the thoracic aorta. Vascular stents in the region of the right mediastinum. Decreased pleural effusions from prior exam. Decreased pulmonary edema. Residual interstitial thickening at the lung bases. No focal airspace disease. No pneumothorax. Osseous structures are grossly stable. IMPRESSION: 1. Mild pulmonary edema at the lung bases, improved from prior exam. Previous pleural effusions have resolved. 2. Stable cardiomegaly and thoracic aortic atherosclerosis. Electronically Signed   By: Jeb Levering M.D.   On: 12/17/2016 06:46    SIGNIFICANT EVENTS  6/09  Admit with COPD/CHF exacerbation   STUDIES:    ASSESSMENT / PLAN:  Discussion:  76 y/o M with PMH of CAD, HTN, CHF & likely undiagnosed COPD with ongoing smoking who presented to [facility] on 6/9 with complaints of shortness of breath. Initial cause of dyspnea likely multifactorial as presivously described, but now with hypercarbia due to exacerbation of obstructive lung disease as well as medication induced (Ambien).   Plan: - Will bring ICU and intubate - Follow up CXR post-intubation - Xopenex + Atrovent Q6  - Solumedrol 60 mg - Rate control of AF - Pt will need outpatient PFT's once acute illness resolved  - Agree with Azithromycin for possible COPD exacerbation  - Daily weights > obtain accurate weight once respiratory status improved  - Continue Eliquis for AF  CRITICAL CARE Performed by: Luz Brazen   Total critical care time: 45 minutes  Critical care time was exclusive of separately billable procedures and treating other patients.  Critical care was necessary to treat or prevent imminent or life-threatening deterioration.  Critical care was time spent personally by me on the following activities: development of treatment plan with patient and/or surrogate as well as nursing, discussions with  consultants, evaluation of patient's response to treatment, examination of patient, obtaining history from patient or surrogate, ordering and performing treatments and interventions, ordering and review of laboratory studies, ordering and review of radiographic studies, pulse oximetry and re-evaluation of patient's condition.  Luz Brazen, MD Pulmonary & Critical Care Medicine December 19, 2016, 6:17 AM

## 2016-12-19 NOTE — Progress Notes (Signed)
Along with Dr. Durenda Age note from this evening, we discussed patient's increased work of breathing, confusion and worsening respiratory acidosis with Dr. Elsworth Soho with PCCM. Despite patient being on BiPAP for 3 hours, his mental and respiratory status have worsened as confirmed by his ABG. Patient appears to be failing BiPAP. We gave an additional dose of Lasix 40 mg IV; however, his CXR is not overwhelming impressive for pulmonary edema and volume overload may not be the driving factor. After discussion with PCCM, patient will be seen by their service. I have placed orders to transfer him to the ICU.  Martyn Malay, DO PGY-3 Internal Medicine Resident Pager # [phone] 12/19/2016 5:39 AM

## 2016-12-20 ENCOUNTER — Inpatient Hospital Stay (HOSPITAL_COMMUNITY): Payer: PPO

## 2016-12-20 DIAGNOSIS — J9601 Acute respiratory failure with hypoxia: Secondary | ICD-10-CM

## 2016-12-20 DIAGNOSIS — J9602 Acute respiratory failure with hypercapnia: Secondary | ICD-10-CM

## 2016-12-20 DIAGNOSIS — I10 Essential (primary) hypertension: Secondary | ICD-10-CM

## 2016-12-20 LAB — GLUCOSE, CAPILLARY
GLUCOSE-CAPILLARY: 85 mg/dL (ref 65–99)
GLUCOSE-CAPILLARY: 85 mg/dL (ref 65–99)
GLUCOSE-CAPILLARY: 128 mg/dL — AB (ref 65–99)
Glucose-Capillary: 120 mg/dL — ABNORMAL HIGH (ref 65–99)
Glucose-Capillary: 136 mg/dL — ABNORMAL HIGH (ref 65–99)
Glucose-Capillary: 86 mg/dL (ref 65–99)

## 2016-12-20 LAB — BASIC METABOLIC PANEL
ANION GAP: 9 (ref 5–15)
BUN: 47 mg/dL — ABNORMAL HIGH (ref 6–20)
CHLORIDE: 98 mmol/L — AB (ref 101–111)
CO2: 26 mmol/L (ref 22–32)
Calcium: 8.1 mg/dL — ABNORMAL LOW (ref 8.9–10.3)
Creatinine, Ser: 2.14 mg/dL — ABNORMAL HIGH (ref 0.61–1.24)
GFR calc non Af Amer: 28 mL/min — ABNORMAL LOW (ref >60)
GFR, EST AFRICAN AMERICAN: 33 mL/min — AB (ref >60)
GLUCOSE: 87 mg/dL (ref 65–99)
POTASSIUM: 4 mmol/L (ref 3.5–5.1)
Sodium: 133 mmol/L — ABNORMAL LOW (ref 135–145)

## 2016-12-20 LAB — MAGNESIUM: Magnesium: 2.4 mg/dL (ref 1.7–2.4)

## 2016-12-20 LAB — CBC
HEMATOCRIT: 34.7 % — AB (ref 39.0–52.0)
HEMOGLOBIN: 11.3 g/dL — AB (ref 13.0–17.0)
MCH: 29.7 pg (ref 26.0–34.0)
MCHC: 32.6 g/dL (ref 30.0–36.0)
MCV: 91.1 fL (ref 78.0–100.0)
PLATELETS: 136 10*3/uL — AB (ref 150–400)
RBC: 3.81 MIL/uL — AB (ref 4.22–5.81)
RDW: 17 % — ABNORMAL HIGH (ref 11.5–15.5)
WBC: 8.8 10*3/uL (ref 4.0–10.5)

## 2016-12-20 NOTE — Progress Notes (Signed)
This note also relates to the following rows which could not be included: SpO2 - Cannot attach notes to unvalidated device data  Pt placed back on full vent support due to increased RR.

## 2016-12-20 NOTE — Progress Notes (Signed)
Name: Kevin Watkins MRN: [id number] DOB: [birth year]    ADMISSION DATE:  12/17/2016 CONSULTATION DATE:  12/18/16  REFERRING MD :  Dr. Dareen Piano  CHIEF COMPLAINT:  SOB   HISTORY OF PRESENT ILLNESS:  76 y/o M who presented to [facility] on 6/9 with complaints of shortness of breath, swelling and low O2 saturations.    At baseline, the patient is on 1L O2.  He has a medical hx of CAD, Dyslipidemia, HTN, PAD and tobacco abuse (64 years, up to 2ppd, currently 1/2 pk to 1ppd).  He reports he has noted swelling over the past few weeks and has increased his lasix.  The patient reports he weighs himself daily and normally is around 196-198 lbs.  As of late, he has had difficulty lying flat due to SOB. On am of admit, he woke around 0200 with shortness of breath.  He reports he took an extra sleeping pill the evening prior and was concerned he "overdosed on it".  CXR on admit was concerning for possible edema but no overt infiltrate. Initial labs - Na 131, K 3.9, Cl 98, BUN 14, Sr Cr 1.58, BNP 187, troponin 0.00, WBC 8.4, Hgb 12.9 and platelets 132.The patient was admitted per IMTS for evaluation of possible COPD and CHF exacerbation.  On 6/10 he developed worsening shortness of breath and was transferred to SDU for evaluation on BiPAP.    PCCM consulted for evaluation.   The patient was given additional lasix and placed on BiPAP with improvement in symptoms.    SUBJECTIVE:  No acute events.  Comfortable and following commands this AM.  Hopeful for successful wean followed by extubation this AM. SCr up.  VITAL SIGNS: Temp:  [98 F (36.7 C)-100.8 F (38.2 C)] 100.4 F (38 C) (06/12 0724) Pulse Rate:  [101-136] 108 (06/12 0615) Resp:  [12-23] 15 (06/12 0615) BP: (63-145)/(43-83) 89/61 (06/12 0615) SpO2:  [95 %-100 %] 98 % (06/12 0615) FiO2 (%):  [40 %-50 %] 40 % (06/12 0345) Weight:  [91.2 kg (201 lb 1 oz)] 91.2 kg (201 lb 1 oz) (06/12 0500)  PHYSICAL EXAMINATION: General: Adult male, awake on  vent. Neuro: Awake on vent, follows commands. HEENT: [place]/AT, PERRL. Cardiovascular: RRR, no M/R/G. Lungs: Clear bilaterally. Abdomen: BS x 4, S/NT/ND. Musculoskeletal: No deformities, no edema. Skin: Warm, dry, intact.    Recent Labs Lab 12/18/16 0248 12/19/16 0255 12/20/16 0522  NA 130* 129* 133*  K 3.9 4.2 4.0  CL 95* 95* 98*  CO2 26 26 26   BUN 24* 34* 47*  CREATININE 1.65* 1.51* 2.14*  GLUCOSE 160* 156* 87     Recent Labs Lab 12/17/16 0634 12/18/16 0248 12/20/16 0522  HGB 12.9* 12.1* 11.3*  HCT 38.3* 35.9* 34.7*  WBC 8.4 6.4 8.8  PLT 132* 120* 136*    Dg Chest Port 1 View  Result Date: 12/20/2016 CLINICAL DATA:  75 year old male with respiratory failure. Subsequent encounter. EXAM: PORTABLE CHEST 1 VIEW COMPARISON:  12/19/2016. FINDINGS: Endotracheal tube tip 2.2 cm above the carina. Nasogastric tube courses below the diaphragm. Tip is not included on the present exam. Stents right subclavian artery level. Calcified aorta. Pulmonary vascular congestion/ mild pulmonary edema superimposed upon chronic lung changes without significant change. IMPRESSION: Pulmonary vascular congestion/ mild pulmonary edema superimposed upon chronic lung changes without significant change. Aortic atherosclerosis. Electronically Signed   By: Genia Del M.D.   On: 12/20/2016 07:02   Dg Chest Port 1 View  Result Date: 12/19/2016 CLINICAL DATA:  Intubated patient,  acute respiratory failure, COPD, chronic CHF, peripheral vascular disease. EXAM: PORTABLE CHEST 1 VIEW COMPARISON:  Portable chest x-ray of December 19, 2016 at 4:59 a.m. FINDINGS: The patient has undergone interval intubation of the trachea and esophagus. The endotracheal tube tip lies 5 cm above the carina. The esophagogastric tube tip projects below the inferior margin of the image. The heart and pulmonary vascularity are normal. There is calcification in the wall of the thoracic aorta. The lung markings are coarse at both bases  greatest on the right. IMPRESSION: COPD. Improved pulmonary interstitial edema. Probable bibasilar subsegmental atelectasis greatest on the right. No significant pleural effusion and no pneumothorax. The support tubes are in reasonable position. Thoracic aortic atherosclerosis. Electronically Signed   By: David  Martinique M.D.   On: 12/19/2016 07:47   Dg Chest Port 1 View  Result Date: 12/19/2016 CLINICAL DATA:  Initial evaluation for acute hypoxia, dyspnea. EXAM: PORTABLE CHEST 1 VIEW COMPARISON:  Prior radiograph from 12/18/2016. FINDINGS: Moderate cardiomegaly, stable. Mediastinal silhouette within normal limits. Aortic atherosclerosis. Lungs normally inflated. Slight interval worsening in diffuse pulmonary vascular congestion with diffuse interstitial prominence, suggesting worsened pulmonary interstitial edema. Underlying COPD. No definite focal infiltrates, although superimposed infection at the right lung base would be difficult to exclude. No obvious pleural effusion. No pneumothorax. No acute osseus abnormality. IMPRESSION: 1. Slight interval worsening in diffuse pulmonary vascular congestion with interstitial prominence, suggesting worsened pulmonary interstitial edema. 2. Underlying COPD. 3. Cardiomegaly with aortic atherosclerosis. Electronically Signed   By: Jeannine Boga M.D.   On: 12/19/2016 05:13   Dg Chest Port 1 View  Result Date: 12/18/2016 CLINICAL DATA:  Shortness breath today. EXAM: PORTABLE CHEST 1 VIEW COMPARISON:  12/17/2016. FINDINGS: The cardiac silhouette is mildly enlarged with mild improvement. Mild decrease in prominence of the pulmonary vasculature and interstitial markings. No airspace consolidation. Possible minimal left pleural effusion. IMPRESSION: Improved mild changes of congestive heart failure superimposed on COPD. Electronically Signed   By: Claudie Revering M.D.   On: 12/18/2016 16:42      SIGNIFICANT EVENTS  6/09  Admit with COPD/CHF exacerbation   STUDIES:   CXR 6/12 > mild pulmonary edema    ASSESSMENT / PLAN:  Discussion:  76 y/o M with PMH of CAD, HTN, CHF & likely undiagnosed COPD with ongoing smoking who presented to [facility] on 6/9 with complaints of shortness of breath.  Initial CXR concerning for pulmonary edema.  He was admitted with possible CHF / COPD exacerbations.  Developed acute worsening of SOB on 6/10 and he was given additional lasix and transferred to SDU for BiPAP.  Early morning 6/11 he received Ambien. On the morning of 6/11 he showed evidence for hypercapnic respiratory failure and was transferred to the ICU where he was intubated.   Acute hypercapnic respiratory failure - following administration of Ambien.  S/p intubation 6/11. Probable undiagnosed COPD with possible acute exacerbation. Mild acute pulmonary edema - due to underlying dCHF. Tobacco dependence. Plan: PS wean again this PM, goal extubation (fatigued earlier this AM). Continue pulmonary hygiene. Continue BD's, steroids, abx. Lasix stopped. Tobacco cessation counseling once extubated. If pt in agreement, would f/u with pulmonology as outpatient for optimal management of COPD (may consider full PFT's once over acute issues).  Hypotension - due to level of sedation needed to maintain comfort while intubated.  Currently on low dose neo as support. AoC dCHF with acute exacerbation. A.fib with RVR. Hx HTN. Plan: Continue to wean neo and hope to d/c as is only being  used due to level of sedation needed (has not needed this AM, hopefully can remain off). Continue eliquis, lopressor. Hold lasix, losartan.  Hyponatremia - presumed due to hypervolemia. AKI - slight bump with diuresis. Plan: Lasix and losartan d/c'd. Goal neg balance (currently -578ml).   Will attempt to lower PS back to 5/5 this PM for hopeful extubation.  Monitor in ICU for a few hours post extubation.  Use BiPAP as needed.  If remains stable, then transfer out of ICU and back to IMTS.   CC  time: 30 min.   Kevin Watkins, [place] [facility] Pager: [phone]  or [phone] 12/20/2016, 12:27 PM  Attending Note:  I have examined patient, reviewed labs, studies and notes. I have discussed the case with Kevin Watkins, and I agree with the data and plans as amended above. 76 year old man with history of coronary disease, hypertension and diastolic CHF, atrial fibrillation. He also almost certainly has undiagnosed COPD with continued smoking. He was admitted with respiratory failure felt to have components of both CHF and COPD. He has been diuresed and treated for possible COPD exacerbation. Despite this he failed BiPAP. Possible contribution of oversedation with Xanax and Ambien. He is now mechanically ventilated. On evaluation today he is tachycardic, irregular. He somewhat agitated on mechanical ventilation. He has bilateral expiratory wheezes with some rhonchi heard on the right. He is awake and we'll not do questions, follow commands. His chest x-ray shows some mild edema changes but no overt infiltrate. He was started on pressors 6/11 in order to facilitate sedation. I like to continue his steroids and bronchodilators, hold his losartan and Lasix given the increase in his serum creatinine with aggressive diuresis. Assess him for possible spontaneous breathing and extubation over the next couple days.  Independent critical care time is 35 minutes.   Kevin Apo, MD, PhD 12/20/2016, 1:55 PM [place] [facility] [phone] or if no answer [phone]

## 2016-12-21 ENCOUNTER — Inpatient Hospital Stay (HOSPITAL_COMMUNITY): Payer: PPO

## 2016-12-21 DIAGNOSIS — J811 Chronic pulmonary edema: Secondary | ICD-10-CM

## 2016-12-21 LAB — BLOOD GAS, ARTERIAL
ACID-BASE EXCESS: 0.4 mmol/L (ref 0.0–2.0)
Acid-Base Excess: 1.2 mmol/L (ref 0.0–2.0)
BICARBONATE: 25.8 mmol/L (ref 20.0–28.0)
Bicarbonate: 25.8 mmol/L (ref 20.0–28.0)
Delivery systems: POSITIVE
Drawn by: 270221
Drawn by: 27022
EXPIRATORY PAP: 5
FIO2: 30
INSPIRATORY PAP: 14
O2 CONTENT: 2 L/min
O2 Saturation: 89.6 %
O2 Saturation: 95.2 %
PATIENT TEMPERATURE: 98.6
Patient temperature: 99.1
pCO2 arterial: 52.4 mmHg — ABNORMAL HIGH (ref 32.0–48.0)
pCO2 arterial: 46.2 mmHg (ref 32.0–48.0)
pH, Arterial: 7.314 — ABNORMAL LOW (ref 7.350–7.450)
pH, Arterial: 7.368 (ref 7.350–7.450)
pO2, Arterial: 59.9 mmHg — ABNORMAL LOW (ref 83.0–108.0)
pO2, Arterial: 79.9 mmHg — ABNORMAL LOW (ref 83.0–108.0)

## 2016-12-21 LAB — CBC
HEMATOCRIT: 35.1 % — AB (ref 39.0–52.0)
Hemoglobin: 11.6 g/dL — ABNORMAL LOW (ref 13.0–17.0)
MCH: 30 pg (ref 26.0–34.0)
MCHC: 33 g/dL (ref 30.0–36.0)
MCV: 90.7 fL (ref 78.0–100.0)
Platelets: 133 10*3/uL — ABNORMAL LOW (ref 150–400)
RBC: 3.87 MIL/uL — AB (ref 4.22–5.81)
RDW: 16.7 % — ABNORMAL HIGH (ref 11.5–15.5)
WBC: 9.4 10*3/uL (ref 4.0–10.5)

## 2016-12-21 LAB — BASIC METABOLIC PANEL
Anion gap: 9 (ref 5–15)
BUN: 52 mg/dL — ABNORMAL HIGH (ref 6–20)
CHLORIDE: 102 mmol/L (ref 101–111)
CO2: 25 mmol/L (ref 22–32)
Calcium: 8.1 mg/dL — ABNORMAL LOW (ref 8.9–10.3)
Creatinine, Ser: 1.78 mg/dL — ABNORMAL HIGH (ref 0.61–1.24)
GFR calc non Af Amer: 35 mL/min — ABNORMAL LOW (ref >60)
GFR, EST AFRICAN AMERICAN: 41 mL/min — AB (ref >60)
Glucose, Bld: 103 mg/dL — ABNORMAL HIGH (ref 65–99)
POTASSIUM: 4.5 mmol/L (ref 3.5–5.1)
SODIUM: 136 mmol/L (ref 135–145)

## 2016-12-21 LAB — GLUCOSE, CAPILLARY
GLUCOSE-CAPILLARY: 92 mg/dL (ref 65–99)
GLUCOSE-CAPILLARY: 142 mg/dL — AB (ref 65–99)
Glucose-Capillary: 131 mg/dL — ABNORMAL HIGH (ref 65–99)
Glucose-Capillary: 167 mg/dL — ABNORMAL HIGH (ref 65–99)
Glucose-Capillary: 194 mg/dL — ABNORMAL HIGH (ref 65–99)
Glucose-Capillary: 91 mg/dL (ref 65–99)
Glucose-Capillary: 99 mg/dL (ref 65–99)

## 2016-12-21 LAB — PHOSPHORUS: Phosphorus: 3.8 mg/dL (ref 2.5–4.6)

## 2016-12-21 LAB — MAGNESIUM: Magnesium: 2.7 mg/dL — ABNORMAL HIGH (ref 1.7–2.4)

## 2016-12-21 MED ORDER — FUROSEMIDE 10 MG/ML IJ SOLN
40.0000 mg | Freq: Once | INTRAMUSCULAR | Status: AC
  Administered 2016-12-21: 40 mg via INTRAVENOUS
  Filled 2016-12-21: qty 4

## 2016-12-21 MED ORDER — METHYLPREDNISOLONE SODIUM SUCC 40 MG IJ SOLR
40.0000 mg | Freq: Two times a day (BID) | INTRAMUSCULAR | Status: DC
  Administered 2016-12-21 – 2016-12-22 (×4): 40 mg via INTRAVENOUS
  Filled 2016-12-21 (×6): qty 1

## 2016-12-21 MED ORDER — SODIUM CHLORIDE 0.9 % IV SOLN
0.4000 ug/kg/h | INTRAVENOUS | Status: DC
  Administered 2016-12-21: 0.5 ug/kg/h via INTRAVENOUS
  Administered 2016-12-21: 0.2 ug/kg/h via INTRAVENOUS
  Filled 2016-12-21 (×2): qty 2

## 2016-12-21 MED ORDER — IPRATROPIUM BROMIDE 0.02 % IN SOLN
0.5000 mg | RESPIRATORY_TRACT | Status: DC
  Administered 2016-12-21 – 2016-12-24 (×16): 0.5 mg via RESPIRATORY_TRACT
  Filled 2016-12-21 (×17): qty 2.5

## 2016-12-21 MED ORDER — LEVALBUTEROL HCL 0.63 MG/3ML IN NEBU
0.6300 mg | INHALATION_SOLUTION | RESPIRATORY_TRACT | Status: DC
  Administered 2016-12-21 – 2016-12-24 (×15): 0.63 mg via RESPIRATORY_TRACT
  Filled 2016-12-21 (×25): qty 3

## 2016-12-21 MED ORDER — LEVALBUTEROL HCL 0.63 MG/3ML IN NEBU
0.6300 mg | INHALATION_SOLUTION | RESPIRATORY_TRACT | Status: DC
  Administered 2016-12-21: 0.63 mg via RESPIRATORY_TRACT
  Filled 2016-12-21 (×4): qty 3

## 2016-12-21 NOTE — Progress Notes (Signed)
Extubated later this afternoon A little hypercarbic and slow to arouse. He does follow commands but won't talk  Plan Will cont rx as outlined before Bridge extubation w/ BIPAP PRN   Erick Colace ACNP-BC [place] Pager # [phone] OR # [phone] if no answer

## 2016-12-21 NOTE — Progress Notes (Signed)
eLink Physician-Brief Progress Note Patient Name: Kevin Watkins DOB: 09-17-1940 MRN: [id number]   Date of Service  12/21/2016  HPI/Events of Note  Extubated to BiPAP - Request for ABG.  eICU Interventions  Will order ABG now.      Intervention Category Major Interventions: Respiratory failure - evaluation and management  Sommer,Steven Eugene 12/21/2016, 4:36 PM

## 2016-12-21 NOTE — Care Management Note (Signed)
Case Management Note  Patient Details  Name: Kevin Watkins MRN: [id number] Date of Birth: [birth year]  Subjective/Objective:  Pt admitted with dyspnea                  Action/Plan:  PTA  Independent from home.   Expected Discharge Date:                  Expected Discharge Plan:     In-House Referral:     Discharge planning Services  CM Consult  Post Acute Care Choice:    Choice offered to:     DME Arranged:    DME Agency:     HH Arranged:    HH Agency:     Status of Service:     If discussed at [employer], dates discussed:    Additional Comments:  Maryclare Labrador, RN 12/21/2016, 3:31 PM

## 2016-12-21 NOTE — Progress Notes (Signed)
Name: Kevin Watkins MRN: [id number] DOB: [birth year]    ADMISSION DATE:  12/17/2016 CONSULTATION DATE:  12/18/16  REFERRING MD :  Dr. Dareen Piano  CHIEF COMPLAINT:  SOB   HISTORY OF PRESENT ILLNESS:   76 y/o M who presented to [facility] on 6/9 with complaints of shortness of breath, swelling and low O2 saturations.    At baseline, the patient is on 1L O2.  He has a medical hx of CAD, Dyslipidemia, HTN, PAD and tobacco abuse (64 years, up to 2ppd, currently 1/2 pk to 1ppd).  He reports he has noted swelling over the past few weeks and has increased his lasix.  The patient reports he weighs himself daily and normally is around 196-198 lbs.  As of late, he has had difficulty lying flat due to SOB. On am of admit, he woke around 0200 with shortness of breath.  He reports he took an extra sleeping pill the evening prior and was concerned he "overdosed on it".  CXR on admit was concerning for possible edema but no overt infiltrate. Initial labs - Na 131, K 3.9, Cl 98, BUN 14, Sr Cr 1.58, BNP 187, troponin 0.00, WBC 8.4, Hgb 12.9 and platelets 132.The patient was admitted per IMTS for evaluation of possible COPD and CHF exacerbation.  On 6/10 he developed worsening shortness of breath and was transferred to SDU for evaluation on BiPAP.   -PCCM consulted for evaluation.    SUBJECTIVE:  Weaning. Looks comfortable  VITAL SIGNS: Temp:  [98.5 F (36.9 C)-99 F (37.2 C)] 99 F (37.2 C) (06/13 0747) Pulse Rate:  [94-145] 107 (06/13 0700) Resp:  [12-38] 17 (06/13 0700) BP: (71-149)/(51-104) 108/62 (06/13 0700) SpO2:  [92 %-100 %] 97 % (06/13 0700) FiO2 (%):  [40 %] 40 % (06/13 0254) Weight:  [195 lb 12.3 oz (88.8 kg)] 195 lb 12.3 oz (88.8 kg) (06/13 0347)  Intake/Output Summary (Last 24 hours) at 12/21/16 0850 Last data filed at 12/21/16 0600  Gross per 24 hour  Intake           783.29 ml  Output             1495 ml  Net          -711.71 ml    PHYSICAL EXAMINATION: General appearance:  76 Year old   Male, well nourished, anxious at times Eyes: anicteric sclerae, moist conjunctivae; PERRL, EOMI bilaterally. Mouth:  membranes and no mucosal ulcerations; orally intubated Neck: Trachea midline; neck supple, no JVD Lungs/chest: diffuse wheeze, with normal respiratory effort and no intercostal retractions CV: Rapid irreg irreg, no MRGs  Abdomen: Soft, non-tender; no masses or HSM Extremities: LE edema or extremity lymphadenopathy Skin: Normal temperature, turgor and texture; diffuse ecchymosis Psych: Appropriate affect, alert and oriented to person, place and time    Recent Labs Lab 12/19/16 0255 12/20/16 0522 12/21/16 0334  NA 129* 133* 136  K 4.2 4.0 4.5  CL 95* 98* 102  CO2 26 26 25   BUN 34* 47* 52*  CREATININE 1.51* 2.14* 1.78*  GLUCOSE 156* 87 103*     Recent Labs Lab 12/18/16 0248 12/20/16 0522 12/21/16 0334  HGB 12.1* 11.3* 11.6*  HCT 35.9* 34.7* 35.1*  WBC 6.4 8.8 9.4  PLT 120* 136* 133*    Dg Chest Port 1 View  Result Date: 12/21/2016 CLINICAL DATA:  Endotracheal tube. EXAM: PORTABLE CHEST 1 VIEW COMPARISON:  Radiographs of December 20, 2016. FINDINGS: Stable cardiomegaly and central pulmonary vascular congestion is noted. Atherosclerosis of  thoracic aorta is noted. Mild bilateral perihilar and basilar edema is noted with minimal bilateral pleural effusions. Endotracheal and nasogastric tubes are unchanged in position. No pneumothorax is noted. Bony thorax is unremarkable. IMPRESSION: Stable support apparatus. Stable bilateral pulmonary edema with minimal associated pleural effusions. Electronically Signed   By: Marijo Conception, M.D.   On: 12/21/2016 07:11   Dg Chest Port 1 View  Result Date: 12/20/2016 CLINICAL DATA:  76 year old male with respiratory failure. Subsequent encounter. EXAM: PORTABLE CHEST 1 VIEW COMPARISON:  12/19/2016. FINDINGS: Endotracheal tube tip 2.2 cm above the carina. Nasogastric tube courses below the diaphragm. Tip is not included on the  present exam. Stents right subclavian artery level. Calcified aorta. Pulmonary vascular congestion/ mild pulmonary edema superimposed upon chronic lung changes without significant change. IMPRESSION: Pulmonary vascular congestion/ mild pulmonary edema superimposed upon chronic lung changes without significant change. Aortic atherosclerosis. Electronically Signed   By: Genia Del M.D.   On: 12/20/2016 07:02    SIGNIFICANT EVENTS  6/09  Admit with COPD/CHF exacerbation   STUDIES:  CXR 6/12 > mild pulmonary edema  ASSESSMENT / PLAN:  Discussion:  76 y/o M with PMH of CAD, HTN, CHF & likely undiagnosed COPD with ongoing smoking who presented to [facility] on 6/9 with complaints of shortness of breath.  Initial CXR concerning for pulmonary edema.  He was admitted with possible CHF / COPD exacerbations.  Developed acute worsening of SOB on 6/10 and he was given additional lasix and transferred to SDU for BiPAP.  Early morning 6/11 he received Ambien. On the morning of 6/11 he showed evidence for hypercapnic respiratory failure and was transferred to the ICU where he was intubated.   Acute hypercapnic respiratory failure - following administration of Ambien.  S/p intubation 6/11. Probable undiagnosed COPD with possible acute exacerbation. Mild acute pulmonary edema - due to underlying dCHF. Tobacco dependence. PCXR personally reviewed: ETT good position. Pulmonary edema. Aeration a little worse when c/w 6/12 -nursing reports some increase in secretions but no spike in fever or WBC ct.  MS & agitation appears to be major barrier to weaning efforts.  Plan: Transition to precedex w/ goal RAS 0 to -1 (ok to use neo if required) Resume lasix X 1 today Initiate SBT after precedex started Cont BDs Solumedrol @ 40mg  q 12 Will need tobacco cessation Will need Pulm f/u w/ formal PFTs Need to get better control of AF Day 4/5 azith  Anxiety but no delirium noted Plan precedex  (RAS goal 0 to -1) Dc fent  & change to PRN  Hypotension - due to level of sedation needed to maintain comfort while intubated.  Currently on low dose neo as support. AoC dCHF with acute exacerbation. A.fib with RVR-->rate still control is an issue Hx HTN. Plan: Cont eliquis an lopressor at 12.5 bid Neo to keep MAP > 65 Lasix X 1 Will hold off on adjusting bb or rate control meds until we know what effect the precedex will have on his HR  Fluid and electrolyte disturbance: his hyponatremia has resolved.  Acute on chronic renal failure  CRI stage III at baseline. Looks like BL scr ranges from 1.45 to 1.9  - slight bump with diuresis, this has improved  Plan: Resume lasix Renal dose meds as tolerated  F/u am chemistry  Replace lytes as indicated  Anemia of chronic disease and mild thrombocytopenia  -no evidence of bleeding  Plan Trend cbc On NOAC   Constipation -->5 d no BM  Plan  rx w/ mira lax   Summary  Working on weaning. Little more pulmonary edema on PCXR.  For today: lasix, change to precedex. Neo if needed for BP. Hope to extubate today or tomorrow.   My ccm time 40 minutes.   Erick Colace ACNP-BC [place] Pager # [phone] OR # [phone] if no answer   Attending Note:  I have examined patient, reviewed labs, studies and notes. I have discussed the case with Jerrye Bushy, and I agree with the data and plans as amended above. 76 year old man with a history of coronary disease, hypertension, diastolic CHF, atrial fibrillation. He also has clear obstructive lung disease in the setting of smoking (not clearly quantified). He was omitted with acute on chronic respiratory failure and treated for both of these problems. Unfortunately had progressive failure in the setting of these and also some sedating medication. He required mechanical ventilation 6/12. Managing his sedation and balancing this with his blood pressure has been difficult. He has been on pressor support. On my  evaluation this morning he is slightly agitated with stimulation. Bilateral expiratory wheezes. He has a protuberant abdomen. We will attempt to adjust sedation, start precedex, attempt PSV. He may be able to be extubated. If so then he should use mandatory BiPAP qhs and also prn. He was admitted Independent critical care time is 35 minutes.   Baltazar Apo, MD, PhD 12/21/2016, 3:22 PM [facility] [phone] or if no answer [phone]

## 2016-12-21 NOTE — Procedures (Signed)
Patient HR 140's. Respiration is laboredPlaced back on BIPAP. Will continue to monitor.

## 2016-12-21 NOTE — Procedures (Signed)
Extubation Procedure Note  Patient Details:   Name: Kevin Watkins DOB: Apr 15, 1941 MRN: [id number]   Airway Documentation:     Evaluation  O2 sats: stable throughout Complications: No apparent complications Patient did tolerate procedure well. Bilateral Breath Sounds: Expiratory wheezes   No, Pt was not able to speak   Cordella Register 12/21/2016, 11:32 AM

## 2016-12-22 ENCOUNTER — Inpatient Hospital Stay (HOSPITAL_COMMUNITY): Payer: PPO

## 2016-12-22 ENCOUNTER — Encounter (HOSPITAL_COMMUNITY): Payer: Self-pay | Admitting: Surgery

## 2016-12-22 DIAGNOSIS — I4891 Unspecified atrial fibrillation: Secondary | ICD-10-CM

## 2016-12-22 LAB — GLUCOSE, CAPILLARY
GLUCOSE-CAPILLARY: 173 mg/dL — AB (ref 65–99)
GLUCOSE-CAPILLARY: 169 mg/dL — AB (ref 65–99)
GLUCOSE-CAPILLARY: 149 mg/dL — AB (ref 65–99)
Glucose-Capillary: 138 mg/dL — ABNORMAL HIGH (ref 65–99)
Glucose-Capillary: 165 mg/dL — ABNORMAL HIGH (ref 65–99)
Glucose-Capillary: 177 mg/dL — ABNORMAL HIGH (ref 65–99)

## 2016-12-22 LAB — CBC
HCT: 35 % — ABNORMAL LOW (ref 39.0–52.0)
Hemoglobin: 11.5 g/dL — ABNORMAL LOW (ref 13.0–17.0)
MCH: 29.3 pg (ref 26.0–34.0)
MCHC: 32.9 g/dL (ref 30.0–36.0)
MCV: 89.3 fL (ref 78.0–100.0)
PLATELETS: 161 10*3/uL (ref 150–400)
RBC: 3.92 MIL/uL — AB (ref 4.22–5.81)
RDW: 16.3 % — ABNORMAL HIGH (ref 11.5–15.5)
WBC: 9 10*3/uL (ref 4.0–10.5)

## 2016-12-22 LAB — COMPREHENSIVE METABOLIC PANEL
ALBUMIN: 3.1 g/dL — AB (ref 3.5–5.0)
ALT: 22 U/L (ref 17–63)
ANION GAP: 11 (ref 5–15)
AST: 19 U/L (ref 15–41)
Alkaline Phosphatase: 46 U/L (ref 38–126)
BUN: 52 mg/dL — ABNORMAL HIGH (ref 6–20)
CHLORIDE: 102 mmol/L (ref 101–111)
CO2: 27 mmol/L (ref 22–32)
Calcium: 8.9 mg/dL (ref 8.9–10.3)
Creatinine, Ser: 1.56 mg/dL — ABNORMAL HIGH (ref 0.61–1.24)
GFR calc non Af Amer: 41 mL/min — ABNORMAL LOW (ref >60)
GFR, EST AFRICAN AMERICAN: 48 mL/min — AB (ref >60)
GLUCOSE: 170 mg/dL — AB (ref 65–99)
Potassium: 4.1 mmol/L (ref 3.5–5.1)
Sodium: 140 mmol/L (ref 135–145)
Total Bilirubin: 1.1 mg/dL (ref 0.3–1.2)
Total Protein: 6.5 g/dL (ref 6.5–8.1)

## 2016-12-22 LAB — TRIGLYCERIDES: Triglycerides: 99 mg/dL (ref <150)

## 2016-12-22 MED ORDER — FUROSEMIDE 10 MG/ML IJ SOLN
40.0000 mg | Freq: Once | INTRAMUSCULAR | Status: AC
  Administered 2016-12-22: 40 mg via INTRAVENOUS
  Filled 2016-12-22: qty 4

## 2016-12-22 MED ORDER — BISACODYL 5 MG PO TBEC
10.0000 mg | DELAYED_RELEASE_TABLET | Freq: Every day | ORAL | Status: DC | PRN
  Administered 2016-12-22: 10 mg via ORAL
  Filled 2016-12-22: qty 2

## 2016-12-22 MED ORDER — INSULIN ASPART 100 UNIT/ML ~~LOC~~ SOLN
0.0000 [IU] | SUBCUTANEOUS | Status: DC
  Administered 2016-12-22: 3 [IU] via SUBCUTANEOUS
  Administered 2016-12-22: 2 [IU] via SUBCUTANEOUS
  Administered 2016-12-22 (×2): 3 [IU] via SUBCUTANEOUS
  Administered 2016-12-23 (×3): 2 [IU] via SUBCUTANEOUS

## 2016-12-22 MED ORDER — METOPROLOL TARTRATE 5 MG/5ML IV SOLN
2.5000 mg | INTRAVENOUS | Status: DC | PRN
  Administered 2016-12-23: 5 mg via INTRAVENOUS
  Filled 2016-12-22: qty 5

## 2016-12-22 MED ORDER — MIDAZOLAM HCL 2 MG/2ML IJ SOLN
1.0000 mg | INTRAMUSCULAR | Status: DC | PRN
  Administered 2016-12-22: 1 mg via INTRAVENOUS
  Administered 2016-12-23: 2 mg via INTRAVENOUS
  Filled 2016-12-22 (×2): qty 2

## 2016-12-22 MED ORDER — FENTANYL CITRATE (PF) 100 MCG/2ML IJ SOLN: 25.0000 ug | INTRAMUSCULAR | Status: DC | PRN

## 2016-12-22 MED ORDER — LACTULOSE 10 GM/15ML PO SOLN
30.0000 g | Freq: Once | ORAL | Status: DC
  Filled 2016-12-22: qty 45

## 2016-12-22 MED ORDER — METOPROLOL TARTRATE 25 MG PO TABS
25.0000 mg | ORAL_TABLET | Freq: Two times a day (BID) | ORAL | Status: DC
  Administered 2016-12-22 (×2): 25 mg
  Filled 2016-12-22 (×3): qty 1

## 2016-12-22 MED ORDER — VITAMIN B-1 100 MG PO TABS
100.0000 mg | ORAL_TABLET | Freq: Every day | ORAL | Status: DC
  Administered 2016-12-22 – 2016-12-24 (×3): 100 mg via ORAL
  Filled 2016-12-22 (×3): qty 1

## 2016-12-22 MED ORDER — ADULT MULTIVITAMIN W/MINERALS CH
1.0000 | ORAL_TABLET | Freq: Every day | ORAL | Status: DC
  Administered 2016-12-22 – 2016-12-24 (×3): 1 via ORAL
  Filled 2016-12-22 (×3): qty 1

## 2016-12-22 NOTE — Progress Notes (Signed)
Nursing Note: Patient with elevated HR and BP.  He is diaphoretic and shaking everywhere.  He reports being angry but is unable to tell me why he is angry.  Family asked to meet with me outside the room and advised me that he is a heavy ETOH.  Contacted E-Link and advised of assessment and questioned interventions.  Awaiting orders.

## 2016-12-22 NOTE — Progress Notes (Signed)
eLink Physician-Brief Progress Note Patient Name: Kevin Watkins DOB: 03/23/1941 MRN: [id number]   Date of Service  12/22/2016  HPI/Events of Note  ETOH W/D - CIWA Score = 31.   eICU Interventions  Will order: 1. ICU ETOH W/D CIWA protocol.     Intervention Category Major Interventions: Delirium, psychosis, severe agitation - evaluation and management  Sommer,Steven Eugene 12/22/2016, 8:57 PM

## 2016-12-22 NOTE — Progress Notes (Signed)
Name: Kevin Watkins MRN: [id number] DOB: [birth year]    ADMISSION DATE:  12/17/2016 CONSULTATION DATE:  12/18/16  REFERRING MD :  Dr. Dareen Piano  CHIEF COMPLAINT:  SOB   HISTORY OF PRESENT ILLNESS:   76 y/o M who presented to [facility] on 6/9 with complaints of shortness of breath, swelling and low O2 saturations-  admitted per IMTS for possible COPD and CHF exacerbation.  At baseline, the patient is on 1L O2.  He has a medical hx of CAD, Dyslipidemia, HTN, PAD and tobacco abuse (64 years, up to 2ppd, currently 1/2 pk to 1ppd).   On 6/10 he developed worsening shortness of breath and required intubation    SIGNIFICANT EVENTS  6/09  Admit with COPD/CHF exacerbation  6/11 ETT >>6/13   STUDIES:  Echo 05/2016 nml LVEF  SUBJECTIVE:  Extubated yesterday, required Bipap Wife reports hallucinations overnight Low gr febrile   VITAL SIGNS: Temp:  [98.1 F (36.7 C)-100.5 F (38.1 C)] 100.1 F (37.8 C) (06/14 0349) Pulse Rate:  [99-137] 127 (06/14 0812) Resp:  [11-39] 25 (06/14 0812) BP: (88-170)/(52-114) 144/85 (06/14 0400) SpO2:  [91 %-100 %] 97 % (06/14 0812) FiO2 (%):  [30 %-40 %] 30 % (06/14 0812)  Intake/Output Summary (Last 24 hours) at 12/22/16 0834 Last data filed at 12/22/16 0400  Gross per 24 hour  Intake           164.69 ml  Output             2120 ml  Net         -1955.31 ml    PHYSICAL EXAMINATION: General appearance:  elderly Male, well nourished, anxious , on biPAP FF mask Eyes: anicteric sclerae, moist conjunctivae; PERRL, EOMI bilaterally. Mouth:  membranes and no mucosal ulcerations Neck: Trachea midline; neck supple, no JVD Lungs/chest: scattered BL wheeze, with normal respiratory effort and no intercostal retractions CV: Rapid irreg irreg, no MRGs  Abdomen: Soft, non-tender; no masses or HSM Extremities: LE edema or extremity lymphadenopathy Skin: Normal temperature, turgor and texture; diffuse ecchymosis Psych: anxious affect, alert and oriented to person,  place and time    Recent Labs Lab 12/20/16 0522 12/21/16 0334 12/22/16 0325  NA 133* 136 140  K 4.0 4.5 4.1  CL 98* 102 102  CO2 26 25 27   BUN 47* 52* 52*  CREATININE 2.14* 1.78* 1.56*  GLUCOSE 87 103* 170*     Recent Labs Lab 12/20/16 0522 12/21/16 0334 12/22/16 0325  HGB 11.3* 11.6* 11.5*  HCT 34.7* 35.1* 35.0*  WBC 8.8 9.4 9.0  PLT 136* 133* 161    Dg Chest Port 1 View  Result Date: 12/22/2016 CLINICAL DATA:  Pulmonary edema. EXAM: PORTABLE CHEST 1 VIEW COMPARISON:  12/21/2016 FINDINGS: Enteric tube has been removed. Brachiocephalic and right subclavian artery stents are again seen. The cardiac silhouette remains mildly enlarged. Aortic atherosclerosis is noted. Perihilar and bibasilar interstitial densities have mildly improved with improved aeration of the lung bases. There may be a trace residual right pleural effusion. No pneumothorax is identified. IMPRESSION: Decreased interstitial edema with improved basilar lung aeration. Electronically Signed   By: Logan Bores M.D.   On: 12/22/2016 07:13   Dg Chest Port 1 View  Result Date: 12/21/2016 CLINICAL DATA:  Endotracheal tube. EXAM: PORTABLE CHEST 1 VIEW COMPARISON:  Radiographs of December 20, 2016. FINDINGS: Stable cardiomegaly and central pulmonary vascular congestion is noted. Atherosclerosis of thoracic aorta is noted. Mild bilateral perihilar and basilar edema is noted with minimal bilateral  pleural effusions. Endotracheal and nasogastric tubes are unchanged in position. No pneumothorax is noted. Bony thorax is unremarkable. IMPRESSION: Stable support apparatus. Stable bilateral pulmonary edema with minimal associated pleural effusions. Electronically Signed   By: Marijo Conception, M.D.   On: 12/21/2016 07:11     ASSESSMENT / PLAN:  Discussion:  76 y/o M with PMH of CAD, HTN, CHF & likely undiagnosed COPD with ongoing smoking who presented to [facility] on 6/9 with complaints of shortness of breath.  Initial CXR>> pulmonary  edema vs interstitial pneumonia. He was admitted with possible CHF / COPD exacerbations.  Developed hypercapnic respiratory failure, extubated to Bipap 6/13   Acute hypercapnic respiratory failure - following administration of Ambien.  S/p intubation 6/11. Probable undiagnosed COPD-favor acute exacerbation / interstitial pneumonia. Mild acute pulmonary edema - due to underlying dCHF. Tobacco dependence.  Plan: Ct BiPAP prn Cont BDs Ct Solumedrol @ 40mg  q 12 Counselling for tobacco cessation Will need Pulm f/u on discharge Day 5 azithro  Anxiety with delirium ? ICU related vs hypercarbia Plan precedex  (RAS goal 0 to -1) , now off  fent PRN  Hypotension - due to sedation -resolved AoC dCHF with acute exacerbation. A.fib with RVR Hx HTN. Plan: Cont eliquis  Increase lopressor at 25 bid, if bspasm worse, consider bisoprolol Repeat Lasix for neg balance   Fluid and electrolyte disturbance: his hyponatremia has resolved.  Acute on chronic renal failure  CRI stage III at baseline. Looks like baseline scr ranges from 1.45 to 1.9 Plan: Resume lasix Renal dose meds as tolerated  Replace lytes as indicated  Anemia of chronic disease and mild thrombocytopenia  -no evidence of bleeding  Plan Trend cbc On NOAC   Constipation -->5 d no BM  Plan dulcolax  Wife updated at bedside  Summary - Needs better control RVR, hopeful will come off bipap & delerium will resolve in next 24h  The patient is critically ill with multiple organ systems failure and requires high complexity decision making for assessment and support, frequent evaluation and titration of therapies, application of advanced monitoring technologies and extensive interpretation of multiple databases. Critical Care Time devoted to patient care services described in this note independent of APP time is 32 minutes.   Rigoberto Noel MD

## 2016-12-23 ENCOUNTER — Inpatient Hospital Stay (HOSPITAL_COMMUNITY): Payer: PPO

## 2016-12-23 LAB — GLUCOSE, CAPILLARY
GLUCOSE-CAPILLARY: 158 mg/dL — AB (ref 65–99)
GLUCOSE-CAPILLARY: 152 mg/dL — AB (ref 65–99)
GLUCOSE-CAPILLARY: 170 mg/dL — AB (ref 65–99)
Glucose-Capillary: 132 mg/dL — ABNORMAL HIGH (ref 65–99)
Glucose-Capillary: 144 mg/dL — ABNORMAL HIGH (ref 65–99)

## 2016-12-23 LAB — POCT I-STAT 3, ART BLOOD GAS (G3+)
Acid-Base Excess: 8 mmol/L — ABNORMAL HIGH (ref 0.0–2.0)
Bicarbonate: 32.8 mmol/L — ABNORMAL HIGH (ref 20.0–28.0)
O2 Saturation: 92 %
PH ART: 7.473 — AB (ref 7.350–7.450)
TCO2: 34 mmol/L (ref 0–100)
pCO2 arterial: 44.6 mmHg (ref 32.0–48.0)
pO2, Arterial: 58 mmHg — ABNORMAL LOW (ref 83.0–108.0)

## 2016-12-23 LAB — MAGNESIUM: Magnesium: 2.8 mg/dL — ABNORMAL HIGH (ref 1.7–2.4)

## 2016-12-23 LAB — CBC
HEMATOCRIT: 35.4 % — AB (ref 39.0–52.0)
Hemoglobin: 11.5 g/dL — ABNORMAL LOW (ref 13.0–17.0)
MCH: 29.3 pg (ref 26.0–34.0)
MCHC: 32.5 g/dL (ref 30.0–36.0)
MCV: 90.1 fL (ref 78.0–100.0)
Platelets: 183 10*3/uL (ref 150–400)
RBC: 3.93 MIL/uL — AB (ref 4.22–5.81)
RDW: 16.2 % — ABNORMAL HIGH (ref 11.5–15.5)
WBC: 13.5 10*3/uL — AB (ref 4.0–10.5)

## 2016-12-23 LAB — BASIC METABOLIC PANEL
ANION GAP: 7 (ref 5–15)
BUN: 50 mg/dL — AB (ref 6–20)
CO2: 34 mmol/L — AB (ref 22–32)
Calcium: 9.4 mg/dL (ref 8.9–10.3)
Chloride: 101 mmol/L (ref 101–111)
Creatinine, Ser: 1.52 mg/dL — ABNORMAL HIGH (ref 0.61–1.24)
GFR calc Af Amer: 49 mL/min — ABNORMAL LOW (ref >60)
GFR calc non Af Amer: 42 mL/min — ABNORMAL LOW (ref >60)
Glucose, Bld: 136 mg/dL — ABNORMAL HIGH (ref 65–99)
POTASSIUM: 4.7 mmol/L (ref 3.5–5.1)
Sodium: 142 mmol/L (ref 135–145)

## 2016-12-23 LAB — PHOSPHORUS: Phosphorus: 3.7 mg/dL (ref 2.5–4.6)

## 2016-12-23 MED ORDER — FAMOTIDINE 20 MG PO TABS
10.0000 mg | ORAL_TABLET | Freq: Every day | ORAL | Status: DC
  Administered 2016-12-24: 10 mg via ORAL
  Filled 2016-12-23: qty 1

## 2016-12-23 MED ORDER — METOPROLOL TARTRATE 50 MG PO TABS
50.0000 mg | ORAL_TABLET | Freq: Two times a day (BID) | ORAL | Status: DC
  Administered 2016-12-23 – 2016-12-24 (×3): 50 mg via ORAL
  Filled 2016-12-23 (×3): qty 1

## 2016-12-23 MED ORDER — FUROSEMIDE 40 MG PO TABS
40.0000 mg | ORAL_TABLET | Freq: Every day | ORAL | Status: DC
  Administered 2016-12-23 – 2016-12-24 (×2): 40 mg via ORAL
  Filled 2016-12-23 (×2): qty 1

## 2016-12-23 MED ORDER — ASPIRIN 81 MG PO CHEW
81.0000 mg | CHEWABLE_TABLET | Freq: Every day | ORAL | Status: DC
  Administered 2016-12-24: 81 mg via ORAL
  Filled 2016-12-23: qty 1

## 2016-12-23 MED ORDER — LOSARTAN POTASSIUM 25 MG PO TABS
25.0000 mg | ORAL_TABLET | Freq: Every day | ORAL | Status: DC
  Administered 2016-12-23 – 2016-12-24 (×2): 25 mg via ORAL
  Filled 2016-12-23 (×2): qty 1

## 2016-12-23 MED ORDER — POLYETHYLENE GLYCOL 3350 17 G PO PACK: 17.0000 g | PACK | Freq: Every day | ORAL | Status: DC | PRN

## 2016-12-23 MED ORDER — PREDNISONE 20 MG PO TABS
30.0000 mg | ORAL_TABLET | Freq: Every day | ORAL | Status: DC
  Administered 2016-12-23 – 2016-12-24 (×2): 30 mg via ORAL
  Filled 2016-12-23 (×2): qty 1

## 2016-12-23 MED ORDER — ATORVASTATIN CALCIUM 40 MG PO TABS
40.0000 mg | ORAL_TABLET | Freq: Every day | ORAL | Status: DC
  Administered 2016-12-23 – 2016-12-24 (×2): 40 mg via ORAL
  Filled 2016-12-23 (×2): qty 1

## 2016-12-23 MED ORDER — APIXABAN 5 MG PO TABS
5.0000 mg | ORAL_TABLET | Freq: Two times a day (BID) | ORAL | Status: DC
  Administered 2016-12-23 – 2016-12-24 (×2): 5 mg via ORAL
  Filled 2016-12-23 (×2): qty 1

## 2016-12-23 MED ORDER — ORAL CARE MOUTH RINSE
15.0000 mL | Freq: Two times a day (BID) | OROMUCOSAL | Status: DC
  Administered 2016-12-23 – 2016-12-24 (×3): 15 mL via OROMUCOSAL

## 2016-12-23 MED ORDER — FOLIC ACID 1 MG PO TABS
1000.0000 ug | ORAL_TABLET | Freq: Every day | ORAL | Status: DC
  Administered 2016-12-24: 1 mg via ORAL
  Filled 2016-12-23: qty 1

## 2016-12-23 MED ORDER — INSULIN ASPART 100 UNIT/ML ~~LOC~~ SOLN
0.0000 [IU] | Freq: Three times a day (TID) | SUBCUTANEOUS | Status: DC
  Administered 2016-12-23 – 2016-12-24 (×4): 3 [IU] via SUBCUTANEOUS

## 2016-12-23 NOTE — Progress Notes (Signed)
Name: Kevin Watkins MRN: [id number] DOB: 05/21/1941    ADMISSION DATE:  12/17/2016 CONSULTATION DATE:  12/18/16  REFERRING MD :  Dr. Dareen Piano  CHIEF COMPLAINT:  SOB   HISTORY OF PRESENT ILLNESS:   76 y/o M who presented to [facility] on 6/9 with complaints of shortness of breath, swelling and low O2 saturations-  admitted per IMTS for possible COPD and CHF exacerbation.  At baseline, the patient is on 1L O2.  He has a medical hx of CAD, Dyslipidemia, HTN, PAD and tobacco abuse (64 years, up to 2ppd, currently 1/2 pk to 1ppd).   On 6/10 he developed worsening shortness of breath and required intubation    SIGNIFICANT EVENTS  6/09  Admit with COPD/CHF exacerbation  6/11 ETT >>6/13   STUDIES:  Echo 05/2016 nml LVEF  SUBJECTIVE:  Extubated yesterday, required Bipap Wife reports hallucinations overnight Low gr febrile   VITAL SIGNS: Temp:  [97.8 F (36.6 C)-98.7 F (37.1 C)] 97.8 F (36.6 C) (06/15 0333) Pulse Rate:  [94-196] 113 (06/15 0700) Resp:  [20-57] 24 (06/15 0700) BP: (120-161)/(53-120) 154/79 (06/15 0700) SpO2:  [86 %-99 %] 95 % (06/15 0736) Weight:  [189 lb 6 oz (85.9 kg)] 189 lb 6 oz (85.9 kg) (06/15 0500)  Intake/Output Summary (Last 24 hours) at 12/23/16 0818 Last data filed at 12/23/16 0700  Gross per 24 hour  Intake             1450 ml  Output             3000 ml  Net            -1550 ml   General appearance:  76 Year old  Male, well nourished NAD,  confused,  conversant  Eyes: anicteric sclerae, moist conjunctivae; PERRL, EOMI bilaterally. Mouth:  membranes and no mucosal ulcerations; Neck: Trachea midline; neck supple, no JVD Lungs/chest: diffuse wheeze has improved but still present. + cough with normal respiratory effort and no intercostal retractions CV: tachy irreg irreg, no MRGs  Abdomen: Soft, non-tender; no masses or HSM Extremities: No peripheral edema or extremity lymphadenopathy, scattered areas of ecchymosis  Skin: Normal temperature, turgor and  texture;  Neuro/Psych: Appropriate affect, alert and oriented to person, only. Wants to walk his dog    Recent Labs Lab 12/21/16 0334 12/22/16 0325 12/23/16 0353  NA 136 140 142  K 4.5 4.1 4.7  CL 102 102 101  CO2 25 27 34*  BUN 52* 52* 50*  CREATININE 1.78* 1.56* 1.52*  GLUCOSE 103* 170* 136*     Recent Labs Lab 12/21/16 0334 12/22/16 0325 12/23/16 0353  HGB 11.6* 11.5* 11.5*  HCT 35.1* 35.0* 35.4*  WBC 9.4 9.0 13.5*  PLT 133* 161 183    Dg Chest Port 1 View  Result Date: 12/23/2016 CLINICAL DATA:  Acute respiratory failure, coronary artery disease, COPD, CHF, current smoker. EXAM: PORTABLE CHEST 1 VIEW COMPARISON:  Portable chest x-ray of December 22, 2016 FINDINGS: The lungs are well-expanded. The interstitial markings are mildly increased. The heart and pulmonary vascularity are normal. There is calcification in the wall of the aortic arch. No significant pleural effusion is observed. There is no pneumothorax. IMPRESSION: Mildly increased interstitial markings are consistent with persistent interstitial edema or pneumonia. Underlying changes of COPD. Thoracic aortic atherosclerosis. Electronically Signed   By: David  Martinique M.D.   On: 12/23/2016 07:22   Dg Chest Port 1 View  Result Date: 12/22/2016 CLINICAL DATA:  Pulmonary edema. EXAM: PORTABLE CHEST 1  VIEW COMPARISON:  12/21/2016 FINDINGS: Enteric tube has been removed. Brachiocephalic and right subclavian artery stents are again seen. The cardiac silhouette remains mildly enlarged. Aortic atherosclerosis is noted. Perihilar and bibasilar interstitial densities have mildly improved with improved aeration of the lung bases. There may be a trace residual right pleural effusion. No pneumothorax is identified. IMPRESSION: Decreased interstitial edema with improved basilar lung aeration. Electronically Signed   By: Logan Bores M.D.   On: 12/22/2016 07:13     ASSESSMENT / PLAN:   Acute hypercapnic respiratory failure -  following administration of Ambien.  S/p intubation 6/11. Probable undiagnosed COPD-favor acute exacerbation / interstitial pneumonia. Mild acute pulmonary edema - due to underlying dCHF. Tobacco dependence. Coughing w/ PO intake, need to r/o aspiration  Still wheezing but improved pcxr personally reviewed: improved aeration  Completed 5d azith Plan: Wean o2 Stop bipap Counselling for tobacco cessation Change solumedrol to pred Cont bds Lasix as below PCXR PRN SLP eval  -->swallow fxn   Anxiety with delirium ? ICU related vs hypercarbia Plan Stop sedation   Hypotension - due to sedation -resolved AoC dCHF with acute exacerbation. A.fib with RVR-->rate still not optimized as of 6/15 Hx HTN. Plan: Cont noac Resume home lasix dosing Lopressor at 50mg  bid Cont tele Resume ARB  Fluid and electrolyte disturbance: his hyponatremia has resolved.  Acute on chronic renal failure  CRI stage III at baseline. Looks like baseline scr ranges from 1.45 to 1.9 Plan: Cont lasix Trend intermittent bmp Replace lytes as needed  Anemia of chronic disease and mild thrombocytopenia  -no evidence of bleeding  Plan Trend cbc Cont NOAC  Constipation --> resolved Plan Change to LOC PRN  Wife updated at bedside  Discussion:  39 y/o M with PMH of CAD, HTN, CHF & likely undiagnosed COPD with ongoing smoking who presented to [facility] on 6/9 with complaints of shortness of breath.  Initial CXR>> pulmonary edema vs interstitial pneumonia. He was admitted with possible CHF / COPD exacerbations.  Developed hypercapnic respiratory failure, extubated to [place] 6/13, clinically better. Still a little encephalopathic. Could be steroids. Ruling out hypercarbia. Significant improvement over last 48 hrs.  Residual issues: -taper steroids -transition to home BD regimen  -eval swallow fxn given cough  -get a little better HR control -support slowly resolving delirium -will transfer to tele and IM service    Clementeen Graham

## 2016-12-23 NOTE — Progress Notes (Signed)
I spoke with NP Marni Griffon and confirmed that IMTS will assume care tomorrow, 6/16.

## 2016-12-24 LAB — BASIC METABOLIC PANEL
Anion gap: 8 (ref 5–15)
BUN: 54 mg/dL — AB (ref 6–20)
CHLORIDE: 100 mmol/L — AB (ref 101–111)
CO2: 32 mmol/L (ref 22–32)
CREATININE: 1.51 mg/dL — AB (ref 0.61–1.24)
Calcium: 9.1 mg/dL (ref 8.9–10.3)
GFR calc Af Amer: 50 mL/min — ABNORMAL LOW (ref >60)
GFR calc non Af Amer: 43 mL/min — ABNORMAL LOW (ref >60)
GLUCOSE: 128 mg/dL — AB (ref 65–99)
Potassium: 4 mmol/L (ref 3.5–5.1)
Sodium: 140 mmol/L (ref 135–145)

## 2016-12-24 LAB — GLUCOSE, CAPILLARY
GLUCOSE-CAPILLARY: 188 mg/dL — AB (ref 65–99)
Glucose-Capillary: 110 mg/dL — ABNORMAL HIGH (ref 65–99)
Glucose-Capillary: 105 mg/dL — ABNORMAL HIGH (ref 65–99)

## 2016-12-24 MED ORDER — GUAIFENESIN-DM 100-10 MG/5ML PO SYRP: 5.0000 mL | ORAL_SOLUTION | ORAL | 0 refills | Status: AC | PRN

## 2016-12-24 MED ORDER — LEVALBUTEROL HCL 0.63 MG/3ML IN NEBU
0.6300 mg | INHALATION_SOLUTION | RESPIRATORY_TRACT | Status: DC | PRN
Start: 2016-12-24 — End: 2016-12-24

## 2016-12-24 MED ORDER — IPRATROPIUM BROMIDE 0.02 % IN SOLN: 0.5000 mg | Freq: Four times a day (QID) | RESPIRATORY_TRACT | Status: DC

## 2016-12-24 MED ORDER — THIAMINE HCL 100 MG PO TABS: 100.0000 mg | ORAL_TABLET | Freq: Every day | ORAL | 0 refills | Status: DC

## 2016-12-24 MED ORDER — PREDNISONE 10 MG PO TABS: 30.0000 mg | ORAL_TABLET | Freq: Every day | ORAL | 0 refills | Status: DC

## 2016-12-24 MED ORDER — LEVALBUTEROL HCL 0.63 MG/3ML IN NEBU: 0.6300 mg | INHALATION_SOLUTION | Freq: Four times a day (QID) | RESPIRATORY_TRACT | Status: DC

## 2016-12-24 MED ORDER — NICOTINE 21 MG/24HR TD PT24: 21.0000 mg | MEDICATED_PATCH | Freq: Every day | TRANSDERMAL | 0 refills | Status: DC

## 2016-12-24 MED ORDER — LOSARTAN POTASSIUM 25 MG PO TABS: 25.0000 mg | ORAL_TABLET | Freq: Every day | ORAL | 0 refills | Status: DC

## 2016-12-24 MED ORDER — METOPROLOL TARTRATE 50 MG PO TABS: ORAL_TABLET | ORAL | 1 refills | Status: DC

## 2016-12-24 MED ORDER — GUAIFENESIN-DM 100-10 MG/5ML PO SYRP
5.0000 mL | ORAL_SOLUTION | ORAL | Status: DC | PRN
  Administered 2016-12-24: 5 mL via ORAL
  Filled 2016-12-24: qty 5

## 2016-12-24 MED ORDER — IPRATROPIUM BROMIDE 0.02 % IN SOLN: 0.5000 mg | RESPIRATORY_TRACT | Status: DC | PRN

## 2016-12-24 MED ORDER — PROAIR HFA 108 (90 BASE) MCG/ACT IN AERS
2.0000 | INHALATION_SPRAY | Freq: Four times a day (QID) | RESPIRATORY_TRACT | 2 refills | Status: AC | PRN
Start: 2016-12-24 — End: ?

## 2016-12-24 NOTE — Evaluation (Signed)
Physical Therapy Evaluation Patient Details Name: Kevin Watkins MRN: [id number] DOB: 11-Mar-1941 Today's Date: 12/24/2016   History of Present Illness  76 y/o M who presented to [facility] on 6/9 with complaints of shortness of breath, swelling and low O2 saturations-  admitted per IMTS for possible COPD and CHF exacerbation  Clinical Impression  Orders received for PT evaluation. Patient demonstrates deficits in functional mobility as indicated below. Will benefit from continued skilled PT to address deficits and maximize function. Will see as indicated and progress as tolerated.  OF NOTE: pt with instability during ambulation. Slowed cadence despite cues for increased speed. Patient with noted desaturation on room air to 82% and symptomatic, HR elevated to 120s. Improved to 89% on 2 liters but patient remains 3/4 DOE    Follow Up Recommendations Home health PT;Supervision - Intermittent    Equipment Recommendations  Rolling walker with 5" wheels    Recommendations for Other Services       Precautions / Restrictions Precautions Precautions: Fall Precaution Comments: watch O2 saturations Restrictions Weight Bearing Restrictions: No      Mobility  Bed Mobility Overal bed mobility: Modified Independent             General bed mobility comments: significantly increased time to perform, no physical assist.   Transfers Overall transfer level: Needs assistance Equipment used: 1 person hand held assist Transfers: Sit to/from Stand Sit to Stand: Min assist         General transfer comment: min assist for stability during standing  Ambulation/Gait Ambulation/Gait assistance: Min assist Ambulation Distance (Feet): 120 Feet Assistive device: 1 person hand held assist Gait Pattern/deviations: Step-through pattern;Decreased stride length;Shuffle;Narrow base of support;Trunk flexed Gait velocity: decreased Gait velocity interpretation: Below normal speed for age/gender General Gait  Details: patient with instability during ambulation. Slowed cadence despite cues for increased speed. Patient with noted desaturation on room air to 82% and symptomatic, HR elevated to 120s. Improved to 89% on 2 liters  Stairs            Wheelchair Mobility    Modified Rankin (Stroke Patients Only)       Balance Overall balance assessment: Needs assistance   Sitting balance-Leahy Scale: Fair       Standing balance-Leahy Scale: Poor Standing balance comment: HHA in static standing                             Pertinent Vitals/Pain Pain Assessment: No/denies pain    Home Living Family/patient expects to be discharged to:: Private residence Living Arrangements: Spouse/significant other Available Help at Discharge: Family Type of Home: House Home Access: Stairs to enter Entrance Stairs-Rails: None Technical brewer of Steps: 3 Home Layout: One level Home Equipment: None      Prior Function Level of Independence: Independent               Hand Dominance   Dominant Hand: Right    Extremity/Trunk Assessment   Upper Extremity Assessment Upper Extremity Assessment: Generalized weakness    Lower Extremity Assessment Lower Extremity Assessment: Generalized weakness       Communication   Communication: HOH  Cognition Arousal/Alertness: Awake/alert Behavior During Therapy: Flat affect Overall Cognitive Status: Within Functional Limits for tasks assessed  General Comments      Exercises     Assessment/Plan    PT Assessment Patient needs continued PT services  PT Problem List Decreased strength;Decreased activity tolerance;Decreased balance;Decreased mobility;Cardiopulmonary status limiting activity       PT Treatment Interventions DME instruction;Gait training;Stair training;Functional mobility training;Therapeutic activities;Therapeutic exercise;Balance  training;Patient/family education    PT Goals (Current goals can be found in the Care Plan section)  Acute Rehab PT Goals Patient Stated Goal: to feel better PT Goal Formulation: With patient Time For Goal Achievement: 01/07/17 Potential to Achieve Goals: Good    Frequency Min 3X/week   Barriers to discharge        Co-evaluation               AM-PAC PT "6 Clicks" Daily Activity  Outcome Measure Difficulty turning over in bed (including adjusting bedclothes, sheets and blankets)?: A Lot Difficulty moving from lying on back to sitting on the side of the bed? : A Little Difficulty sitting down on and standing up from a chair with arms (e.g., wheelchair, bedside commode, etc,.)?: A Little Help needed moving to and from a bed to chair (including a wheelchair)?: A Little Help needed walking in hospital room?: A Little Help needed climbing 3-5 steps with a railing? : A Lot 6 Click Score: 16    End of Session Equipment Utilized During Treatment: Gait belt;Oxygen Activity Tolerance: Patient limited by fatigue Patient left: in bed;with call bell/phone within reach;with bed alarm set;with family/visitor present Nurse Communication: Mobility status PT Visit Diagnosis: Unsteadiness on feet (R26.81);Muscle weakness (generalized) (M62.81)    Time: 1032-1050 PT Time Calculation (min) (ACUTE ONLY): 18 min   Charges:   PT Evaluation $PT Eval Moderate Complexity: 1 Procedure     PT G Codes:        Alben Deeds, PT DPT  [place] 12/24/2016, 10:55 AM

## 2016-12-24 NOTE — Evaluation (Signed)
Clinical/Bedside Swallow Evaluation Patient Details  Name: Kevin Watkins MRN: [id number] Date of Birth: 31-Jan-1941  Today's Date: 12/24/2016 Time: SLP Start Time (ACUTE ONLY): 0816 SLP Stop Time (ACUTE ONLY): 0840 SLP Time Calculation (min) (ACUTE ONLY): 24 min  Past Medical History:  Past Medical History:  Diagnosis Date  . CAD in native artery 06/2000   BMS to the RCA; known 60% mid dominant RCA stenosis and 50-60% OM 2 branch stenosis normal LV function. Last Myoview May 2011 negative for ischemia.  [name] COPD (chronic obstructive pulmonary disease) ([place])   . Dyslipidemia, goal LDL below 70   . Edema   . EtOH dependence ([place])    Family reported  . Hypertension   . PAD (peripheral artery disease) (HCC)    iliac, SCA, Innominate, and renal PTA  . Tobacco abuse    Past Surgical History:  Past Surgical History:  Procedure Laterality Date  . ANGIOPLASTY  '01, '03   innominate artery PTA  . Carotid Doppler  2013   R vertebral appears occluded, R subclavian with prox occlusion v. high grade stenosis; R bulb 70-99% diameter reduction; R prox ICA 50-69% diameter reduction; L bulb & prox ICA 50-69% diameter reduction; L subclavian with >50% diameter reduction  . CORONARY ANGIOPLASTY WITH STENT PLACEMENT  06/26/2000    BMS to RCA; residual 60%mid dominant RCA stent, 50-60% OM2 stenosis (Dr. Adora Fridge)  . NM MYOCAR PERF WALL MOTION  2011   persantine myoview - normal perfusion in all regions, EF 64%  . RENAL ANGIOGRAM  09/22/2005   stent to right renal artery with 5x12 Genesis on Aviator stent balloon pre-mount (Dr. Adora Fridge)  . Renal Doppler  2013   R prox renal artery stent with 60-99% in-stent restenosis; L prox renal artery =/> 60% diameter reduction; R & L kidneys normal in size  . SUBCLAVIAN ANGIOGRAM  07/15/2004   stent to right subclavian (7x18 Genesis) and innominate (8x24 Genesis), with known ostial left common carotid stenosis and left subclavian stenosis as well (Dr. Adora Fridge)  .  TRANSTHORACIC ECHOCARDIOGRAM  2013   EF=>55%, mild conc LVH; LA mod dilated; RA mild-mod dilated; mild mitral annular calcif, mild Kevin; mild TR with normal RSVP  . Upper Extremity Arterial Doppler  2013   R prox subclavian artery stent with prox occlusion v. high grade stenosis; R vertebral appears occluded; L subclavian with >60% diameter reduction   HPI:  76 yo male adm to [facility] with dyspnea requiring intubation from 6/11-6/13/18.  Pt diagnosed with pna vs CHF - MD favors pna.  PMH + for prior smoker, ETOH use, PAD. Swallow evaluation ordered as family reported some coughing with po to MD.  Pt takes Zantac per son but pt denies reflux.   Assessment / Plan / Recommendation Clinical Impression  Pt's CN exam negative for deficits impacting oropharyngeal swallow musculature.  Observed pt self feeding breakfast including oatmeal, coffee, bagel - initially no indications of airway compromise. Pt did have minimal cough toward end of meal/intake = suspect may be consistent with esophageal deficits.    Pt denies significant dysphagia and family reports he is doing well today.  Pt's son reports that Kevin Watkins is taking Zantac at home.  Pt denies dysphagia, weight loss, nor poor appetite.    Recommend continue regular/thin diet.  Provided aspiration/xerostomia precautions in writing and verbally.   Thanks for this consult.   SLP Visit Diagnosis: Dysphagia, unspecified (R13.10)    Aspiration Risk  Mild aspiration risk  Diet Recommendation Regular;Thin liquid   Liquid Administration via: Cup;Straw Medication Administration: Whole meds with liquid Supervision: Patient able to self feed Compensations: Slow rate;Small sips/bites Postural Changes: Seated upright at 90 degrees;Remain upright for at least 30 minutes after po intake    Other  Recommendations Oral Care Recommendations: Oral care BID   Follow up Recommendations None      Frequency and Duration     n/a       Prognosis   n/a      Swallow Study   General Date of Onset: 12/24/16 HPI: 76 yo male adm to [facility] with dyspnea requiring intubation from 6/11-6/13/18.  Pt diagnosed with pna vs CHF - MD favors pna.  PMH + for prior smoker, ETOH use, PAD. Swallow evaluation ordered as family reported some coughing with po to MD.  Pt takes Zantac per son but pt denies reflux. Type of Study: Bedside Swallow Evaluation Diet Prior to this Study: Regular;Thin liquids Temperature Spikes Noted: No Respiratory Status: Nasal cannula History of Recent Intubation: No Behavior/Cognition: Alert;Cooperative;Pleasant mood Oral Cavity Assessment: Within Functional Limits Oral Care Completed by SLP: Yes Oral Cavity - Dentition: Dentures, top;Dentures, bottom Vision: Functional for self-feeding Self-Feeding Abilities: Able to feed self Patient Positioning: Upright in chair Baseline Vocal Quality: Normal Volitional Cough: Strong Volitional Swallow: Able to elicit    Oral/Motor/Sensory Function Overall Oral Motor/Sensory Function: Within functional limits   Ice Chips Ice chips: Not tested   Thin Liquid Thin Liquid: Within functional limits    Nectar Thick Nectar Thick Liquid: Not tested   Honey Thick Honey Thick Liquid: Not tested   Puree Puree: Not tested   Solid   GO   Solid: Within functional limits Presentation: [place], [place] [facility] SLP [phone]

## 2016-12-24 NOTE — Progress Notes (Signed)
  ICU Summary: Kevin Watkins is a 76yo male admitted on 6/9 to IMTS with worsening dyspnea. He has a PMH of 120+ pack year smoking history, dCHF, CAD s/p stenting, PVD with renal and subclavian stents, A fib, HTN and bil carotid artery disease. At admission, his CXR showed bil diffuse infiltrates which were thought to represent CHF/COPD exacerbation. He had been on BiPAP and reportedly doing well, however on trial off of BiPAP on 6/11, patient had decreased responsiveness and tachycardia with increased work of breathing that was not improved with trial of BiPAP. ABG reflected hypercarbic respiratory failure and patient was intubated and transferred to the ICU. Repeat chest XR showed slightly worse infiltrates. In the ICU, patient's diuresis was continued and he was continued on azithromycin and solumedrol (now tapered to prednisone). He also had an AKI which has since resolved. ICU course was complicated by agitation, hallucination and alcohol withdrawal. Patient was successfully extubated on 6/13, transitioned to BiPAP and then [place]. At home he is on 1L [place] chronically. Patient transferred to telemetry and IMTS taking over on 6/16.  Subjective:  Patient states his breathing continues to improve, he has no chest tightness, chest pain, palpitations, fevers, or chills. He continues to have a cough productive intermittently of tan sputum +/- bloody streaks. He endorses a good appetite.  Objective:  Vital signs in last 24 hours: Vitals:   12/24/16 0048 12/24/16 0052 12/24/16 0432 12/24/16 0607  BP: (!) 109/59   113/72  Pulse: (!) 106   87  Resp: 20   18  Temp: 97.5 F (36.4 C)   98.3 F (36.8 C)  TempSrc: Oral   Oral  SpO2: 97% 98% 95% 100%  Weight:    189 lb (85.7 kg)  Height:       Constitutional: NAD, pleasant, sitting in chair CV: Irregularly irregular rhythm, normal rate, no murmurs, rubs or gallops appreciated, minimal LE edema Resp: on 2L [place], no increased work of breathing, CTAB with decreased  breath sounds throughout. Abd: soft, NDNT, +BS Neuro: Alert and oriented x 4  Assessment/Plan:  Principal Problem:   Dyspnea Active Problems:   PAD- multiple interventions- renal, iliac, SCA   Essential hypertension   Chronic diastolic heart failure (HCC)   Paroxysmal atrial fibrillation (HCC)   COPD exacerbation (HCC)   Acute respiratory failure with hypoxia and hypercarbia (HCC)   Pulmonary edema  Interstitial pneumonia COPD exacerbation: Patient s/p intubation 6/11-13, now transitioned down to  and saturating at 100%. He has no formal evaluation of his COPD so pulmonology would like to see him outpatient after discharge for PFTs.  --solumedrol>>prednisone 30mg  daily currently; day 8 of 14 --atrovent and xopenex q6hrs --completed course of azithromycin  DCHF: Patient diuresed 5L since admission, current weight is 189lbs. He appears euvolemic on exam.  --home lasix 40mg  PO daily --metoprolol 50mg  BID --losartan 25mg  daily  A fib: Patient on eliquis for anticoagulation. This morning his telemetry appears more like a flutter, rate is in 80s. Patient is asymptomatic. --EKG this AM --Eliquis 5mg  BID --Continue metoprolol 50mg  BID  CAD PVD: S/p RCA bare metal stent in 2001; negative myoview in 2011. S/p renal stent (restenosed), subclavian stent, and carotid artery stenosis.  --continue ASA, atorvastatin  Dispo: Anticipated discharge in approximately 1-2 day(s).   Alphonzo Grieve, MD 12/24/2016, 7:08 AM Pager [phone]

## 2016-12-24 NOTE — Discharge Summary (Signed)
Name: Kevin Watkins MRN: [id number] DOB: 09-Apr-1941 76 y.o. PCP: Ronita Hipps, MD  Date of Admission: 12/17/2016  6:08 AM Date of Discharge: 12/24/2016 Attending Physician: Lucious Groves, DO  Discharge Diagnosis: 1. Respiratory Failure 2. Intersitial pneumonia 3. COPD exacerbation 4. CHF exacerbation Principal Problem:   Dyspnea Active Problems:   PAD- multiple interventions- renal, iliac, SCA   Essential hypertension   Chronic diastolic heart failure (HCC)   Paroxysmal atrial fibrillation (HCC)   COPD exacerbation (HCC)   Acute respiratory failure with hypoxia and hypercarbia (HCC)   Pulmonary edema   Discharge Medications: Allergies as of 12/24/2016   No Known Allergies     Medication List    STOP taking these medications   zolpidem 10 MG tablet Commonly known as:  AMBIEN     TAKE these medications   apixaban 5 MG Tabs tablet Commonly known as:  ELIQUIS Take 1 tablet (5 mg total) by mouth 2 (two) times daily.   aspirin EC 81 MG tablet Take 81 mg by mouth daily.   atorvastatin 40 MG tablet Commonly known as:  LIPITOR Take 40 mg by mouth daily at 6 PM.   FISH OIL 300 MG Caps Take 2,400 mg by mouth daily.   folic acid 1 MG tablet Commonly known as:  FOLVITE Take 800 mcg by mouth daily.   furosemide 40 MG tablet Commonly known as:  LASIX Take 2 tablets (80 mg total) by mouth daily. What changed:  how much to take  when to take this  additional instructions   Garlic 3810 MG Caps Take 1 capsule by mouth daily.   guaiFENesin-dextromethorphan 100-10 MG/5ML syrup Commonly known as:  ROBITUSSIN DM Take 5 mLs by mouth every 4 (four) hours as needed for cough.   ipratropium-albuterol 0.5-2.5 (3) MG/3ML Soln Commonly known as:  DUONEB Take 3 mLs by nebulization 4 (four) times daily as needed (shortness of breath).   losartan 25 MG tablet Commonly known as:  COZAAR Take 1 tablet (25 mg total) by mouth daily.   metoprolol tartrate 50 MG  tablet Commonly known as:  LOPRESSOR Take 1/2 tablet twice a day What changed:  medication strength   MIRALAX PO Take 17 g by mouth daily.   nicotine 21 mg/24hr patch Commonly known as:  NICODERM CQ - dosed in mg/24 hours Place 1 patch (21 mg total) onto the skin daily. Start taking on:  12/25/2016   nitroGLYCERIN 0.4 MG SL tablet Commonly known as:  NITROSTAT Place 0.4 mg under the tongue every 5 (five) minutes as needed for chest pain.   predniSONE 10 MG tablet Commonly known as:  DELTASONE Take 3 tablets (30 mg total) by mouth daily with breakfast. Take 3 tablets a day for next two days, then 2 tablets a day for two days, then 1 tablet for two days then stop. Start taking on:  12/25/2016   PROAIR HFA 108 (90 Base) MCG/ACT inhaler Generic drug:  albuterol Inhale 2 puffs into the lungs every 6 (six) hours as needed for wheezing or shortness of breath. What changed:  Another medication with the same name was removed. Continue taking this medication, and follow the directions you see here.   ranitidine 75 MG tablet Commonly known as:  ZANTAC Take 75 mg by mouth daily.   thiamine 100 MG tablet Take 1 tablet (100 mg total) by mouth daily. Start taking on:  12/25/2016   vitamin E 400 UNIT capsule Generic drug:  vitamin E Take 400 Units  by mouth daily.            Durable Medical Equipment        Start     Ordered   12/24/16 1641  For home use only DME oxygen  Once    Question Answer Comment  Mode or (Route) Nasal cannula   Liters per Minute 2   Frequency Continuous (stationary and portable oxygen unit needed)   Oxygen conserving device No   Oxygen delivery system Gas      12/24/16 1641      Disposition and follow-up:   Kevin Watkins was discharged from [facility] in Stable condition.  At the hospital follow up visit please address:  1.   Interstitial pneumonia/COPD exacerbation: --patient discharged with 2L [place] continuous oxygen; assess for  continued need --prednisone taper to end on 6/22 (outlined below in pt instructions)  Diastolic CHF exacerbation: --lasix 40mg  PO daily, metoprolol 50mg  BID, losartan 25mg  daily --encourage low salt and fluid restriction  EtOH/benzo withdrawal: --d/c'd [place] - likely contributed to initial presentation and worsened decompensation leading to intubation --please encourage limited alcohol intake  2.  Labs / imaging needed at time of follow-up: bmet  3.  Pending labs/ test needing follow-up: none  Follow-up Appointments: Follow-up Information    Parrett, Fonnie Mu, NP Follow up on 01/17/2017.   Specialty:  Pulmonary Disease Why:  415pm  Contact information: [address] [address] [phone]        Rigoberto Noel, MD Follow up on 04/10/2017.   Specialty:  Pulmonary Disease Why:  at 145 Contact information: [address] [address] [phone]        Ronita Hipps, MD. Schedule an appointment as soon as possible for a visit in 1 week(s).   Specialty:  Family Medicine Contact information: [address] [place] [address] [phone]        Lorretta Harp, MD. Schedule an appointment as soon as possible for a visit in 1 month(s).   Specialties:  Cardiology, Radiology Contact information: [address] [place] [address] [place] [facility] Course by problem list: Principal Problem:   Dyspnea Active Problems:   PAD- multiple interventions- renal, iliac, SCA   Essential hypertension   Chronic diastolic heart failure (HCC)   Paroxysmal atrial fibrillation (HCC)   COPD exacerbation (HCC)   Acute respiratory failure with hypoxia and hypercarbia (HCC)   Pulmonary edema   Respiratory failure requiring intubation Interstitial pneumonia COPD exacerbation: Kevin Watkins is a 76yo male admitted on 6/9 with worsening dyspnea which he noticed after taking an extra ambien for sleep. He has a PMH of  120+ pack year smoking history, dCHF, CAD s/p stenting, PVD with renal and subclavian stents, A fib, HTN and bil carotid artery disease. At admission, his CXR showed bil diffuse infiltrates which were thought to represent CHF/COPD exacerbation. He had been on BiPAP and reportedly doing well, however on trial off of BiPAP on 6/11, patient had decreased responsiveness and tachycardia with increased work of breathing that was not improved with being back on BiPAP. ABG reflected hypercarbic respiratory failure and patient was intubated and transferred to the ICU. Repeat chest XR showed slightly worse infiltrates. In the ICU, patient's diuresis was continued and he was continued on azithromycin (and completed course) and solumedrol (now tapered to prednisone). Patient was successfully extubated on 6/13, transitioned to BiPAP and then [place].  At home he is on 1L [place] at night. Patient transferred to telemetry with significant improvement in his symptoms. During PT evaluation, patient was found to desaturate to mid-80's with ambulation, so he was discharged on 2L [place] oxygen until he is evaluated again. Since he has no formal diagnosis of COPD, pulmonology would like to do spirometry as well as see him in their clinic for follow up.   Diastolic CHF exacerbation: During ICU stay, patient was diuresed 5L since admission, and was discharged at a weight of 189lbs - at discharge he was clinically euvolemic. He was discharged on his home dose of lasix 40mg  PO daily, losartan 25mg  daily, and metoprolol 50mg  BID (increased due to inc heart rates).  AKI: During ICU stay, patient had an AKI in setting of diuresis which resolved by time he was transferred out. On discharge his Cr was 1.5 which seems to be his baseline. His electrolytes remained stable.  A fib: During ICU stay, patient had increased heart rates which at times required supplemental IV metoprolol for control; on his transfer to the floor, his home metoprolol dose was  increased to 50mg  BID, which he tolerated well and maintained good control over his rates. He was maintained on anticoagulation throughout his stay and had no change in his Eliquis 5mg  BID at discharge.  EtOH/benzo withdrawal: During ICU stay, extubation was delayed due to increasing patient agitation, hallucination and delirium; family notified treatment team that patient drinks heavily at home and he was also on chronic ambien. Patient was successfully detoxed while in the ICU and stepdown. On discharge, his Kevin Watkins was discontinued from his home med list.   CAD PVD: S/p RCA bare metal stent in 2001; s/p renal stent (restenosed), subclavian stent, and carotid artery stenosis. Patient was continued on aspirin and atorvastatin.   Discharge Vitals:   BP (!) 108/48 (BP Location: Left Arm)   Pulse 87   Temp 97.9 F (36.6 C) (Oral)   Resp 20   Ht 5\' 7"  (1.702 m)   Wt 189 lb (85.7 kg)   SpO2 99%   BMI 29.60 kg/m   Pertinent Labs, Studies, and Procedures:  CBC Latest Ref Rng & Units 12/23/2016 12/22/2016 12/21/2016  WBC 4.0 - 10.5 K/uL 13.5(H) 9.0 9.4  Hemoglobin 13.0 - 17.0 g/dL 11.5(L) 11.5(L) 11.6(L)  Hematocrit 39.0 - 52.0 % 35.4(L) 35.0(L) 35.1(L)  Platelets 150 - 400 K/uL 183 161 133(L)   BMP Latest Ref Rng & Units 12/24/2016 12/23/2016 12/22/2016  Glucose 65 - 99 mg/dL 128(H) 136(H) 170(H)  BUN 6 - 20 mg/dL 54(H) 50(H) 52(H)  Creatinine 0.61 - 1.24 mg/dL 1.51(H) 1.52(H) 1.56(H)  Sodium 135 - 145 mmol/L 140 142 140  Potassium 3.5 - 5.1 mmol/L 4.0 4.7 4.1  Chloride 101 - 111 mmol/L 100(L) 101 102  CO2 22 - 32 mmol/L 32 34(H) 27  Calcium 8.9 - 10.3 mg/dL 9.1 9.4 8.9     Discharge Instructions: Discharge Instructions    (HEART FAILURE PATIENTS) Call MD:  Anytime you have any of the following symptoms: 1) 3 pound weight gain in 24 hours or 5 pounds in 1 week 2) shortness of breath, with or without a dry hacking cough 3) swelling in the hands, feet or stomach 4) if you have to sleep on  extra pillows at night in order to breathe.    Complete by:  As directed    Call MD for:  difficulty breathing, headache or visual disturbances    Complete by:  As directed  Call MD for:  extreme fatigue    Complete by:  As directed    Call MD for:  hives    Complete by:  As directed    Call MD for:  persistant dizziness or light-headedness    Complete by:  As directed    Call MD for:  persistant nausea and vomiting    Complete by:  As directed    Call MD for:  redness, tenderness, or signs of infection (pain, swelling, redness, odor or green/yellow discharge around incision site)    Complete by:  As directed    Call MD for:  severe uncontrolled pain    Complete by:  As directed    Call MD for:  temperature >100.4    Complete by:  As directed    Diet - low sodium heart healthy    Complete by:  As directed    Discharge instructions    Complete by:  As directed    Please stop taking ambien.  We have increased your metoprolol dose to 50mg  twice a day.  For your COPD, you will take prednisone for a few more days. -Starting tomorrow, take 3 tablets (30mg ) of prednisone a day for two days -then on 6/19, take 2 tablets (20mg ) of prednisone a day for two days -then on 6/21, take 1 tablet (10mg ) of prednisone a day for 2 days then stop  Please use oxygen at 2L continuously until you are seen by the lung doctors.  Please take your lasix 40mg  once a day and weigh yourself every day. If you gain more than 3lbs in a day or 5lbs in a week, please call your primary care provider. Please limit your salt intake to 2000mg  a day; there is hidden salt in fried foods, canned foods that you need to take into consideration. Please limit your liquid intake to 2 liters (~68 ounces) of ANY fluid a day.   Please call your primary care provider to be seen in about one week for a hospital follow up visit. Please call your heart doctor to be seen in about 1 month. Please follow up with the lung doctor on July  10th.   Increase activity slowly    Complete by:  As directed       Signed: Alphonzo Grieve, MD 12/24/2016, 5:15 PM   Pager [phone]

## 2016-12-24 NOTE — Progress Notes (Signed)
CM notified [facility] rep, jermaine to please arrange for transport home as pt going from nocturnal to continuous.  New order for continuous in system and [employer] will deliver the transport tank for ride home then arrange for continuous system(portable tanks) with pt.  HHPT will be set up with agency in pt's demographic area tomorrow as agencies are now closed for the day.

## 2016-12-24 NOTE — Discharge Summary (Signed)
Pt got discharged to home, discharge instructions provided and patient showed understanding to it, IV taken out,Telemonitor DC,pt left unit in wheelchair with all of the belongings accompanied with a family member (wife), patient family members also taught how to use oxygen, as pt discharge with oxygen from hospital.

## 2016-12-24 NOTE — Progress Notes (Signed)
Internal Medicine Attending:   I saw and examined the patient. I reviewed the resident's note and I agree with the resident's findings and plan as documented in the resident's note. SOB improved, occasional cough. No other complaints.  Will have him ambulate with PT/nursing, can likely be discharged home later today versus tomorrow.

## 2016-12-24 NOTE — Progress Notes (Signed)
SATURATION QUALIFICATIONS: (This note is used to comply with regulatory documentation for home oxygen)  Patient Saturations on Room Air at Rest = 93%  Patient Saturations on Room Air while Ambulating = 82%  Patient Saturations on 2 Liters of oxygen while Ambulating = 89%  Please briefly explain why patient needs home oxygen: desaturates with minimal activity  Alben Deeds, PT DPT  [phone]

## 2016-12-24 NOTE — Progress Notes (Signed)
12 lead EKG done this am but some reason it is not pulling up in EPIC, but the copy is attached in chart, will continue to monitor

## 2016-12-25 NOTE — Progress Notes (Signed)
CM followed up with pt for choice of home health agency and pt states he doesn't want home health at this time.  CM encouraged him to follow up with his PCP if he changes his mind for HHPT.  NO other CM needs were communicated.

## 2016-12-26 DIAGNOSIS — J449 Chronic obstructive pulmonary disease, unspecified: Secondary | ICD-10-CM | POA: Diagnosis not present

## 2016-12-27 DIAGNOSIS — J449 Chronic obstructive pulmonary disease, unspecified: Secondary | ICD-10-CM | POA: Diagnosis not present

## 2016-12-27 DIAGNOSIS — R0600 Dyspnea, unspecified: Secondary | ICD-10-CM | POA: Diagnosis not present

## 2016-12-27 DIAGNOSIS — I5032 Chronic diastolic (congestive) heart failure: Secondary | ICD-10-CM | POA: Diagnosis not present

## 2016-12-28 ENCOUNTER — Ambulatory Visit (INDEPENDENT_AMBULATORY_CARE_PROVIDER_SITE_OTHER): Payer: PPO | Admitting: Cardiovascular Disease

## 2016-12-28 ENCOUNTER — Encounter: Payer: Self-pay | Admitting: Cardiovascular Disease

## 2016-12-28 DIAGNOSIS — I5032 Chronic diastolic (congestive) heart failure: Secondary | ICD-10-CM

## 2016-12-28 DIAGNOSIS — I10 Essential (primary) hypertension: Secondary | ICD-10-CM | POA: Diagnosis not present

## 2016-12-28 DIAGNOSIS — I480 Paroxysmal atrial fibrillation: Secondary | ICD-10-CM

## 2016-12-28 DIAGNOSIS — Z720 Tobacco use: Secondary | ICD-10-CM | POA: Diagnosis not present

## 2016-12-28 DIAGNOSIS — I739 Peripheral vascular disease, unspecified: Secondary | ICD-10-CM

## 2016-12-28 DIAGNOSIS — J449 Chronic obstructive pulmonary disease, unspecified: Secondary | ICD-10-CM | POA: Diagnosis not present

## 2016-12-28 DIAGNOSIS — I2510 Atherosclerotic heart disease of native coronary artery without angina pectoris: Secondary | ICD-10-CM

## 2016-12-28 DIAGNOSIS — R0609 Other forms of dyspnea: Secondary | ICD-10-CM | POA: Diagnosis not present

## 2016-12-28 DIAGNOSIS — E785 Hyperlipidemia, unspecified: Secondary | ICD-10-CM | POA: Diagnosis not present

## 2016-12-28 DIAGNOSIS — R06 Dyspnea: Secondary | ICD-10-CM | POA: Diagnosis not present

## 2016-12-28 MED ORDER — THIAMINE HCL 100 MG PO TABS: 100.0000 mg | ORAL_TABLET | Freq: Every day | ORAL | 3 refills | Status: AC

## 2016-12-28 NOTE — Assessment & Plan Note (Signed)
History of paroxysmal atrial fibrillation on Eliquis oral anticoagulation. 

## 2016-12-28 NOTE — Assessment & Plan Note (Signed)
History of peripheral arterial disease status post right subclavian, right innominate and right renal artery intervention. He does have known ostial left common carotid artery stenosis. We have been following these by duplex ultrasound.

## 2016-12-28 NOTE — Addendum Note (Signed)
Addended by: Therisa Doyne on: 12/28/2016 04:07 PM   Modules accepted: Orders

## 2016-12-28 NOTE — Assessment & Plan Note (Signed)
History of CAD status post RCA intervention by myself in December 2001 with a 3.5 mm x 12 mm long Medtronic S7 bare-metal stent. He did have some disease in his OM 2 branch and normal LV function that time. He had a nonischemic my view 11/28/09. He denies chest pain.

## 2016-12-28 NOTE — Patient Instructions (Signed)

## 2016-12-28 NOTE — Assessment & Plan Note (Signed)
History of chronic diastolic heart failure on oral diuretics. He is aware of the importance of salt avoidance. Most recent echo performed 05/24/16 revealed normal LV systolic function.

## 2016-12-28 NOTE — Assessment & Plan Note (Signed)
History of dyslipidemia on statin therapy followed by his PCP 

## 2016-12-28 NOTE — Progress Notes (Signed)
12/28/2016 Kevin Watkins   1941-05-24  [id number]  Primary Physician Ronita Hipps, MD Primary Cardiologist: Lorretta Harp MD [name]  HPI:  The patient is a 76 year old mildly overweight married Caucasian male father of 68, grandfather to 2 grandchildren who I last saw in the office 07/15/16.[name] He has a history of CAD and PVOD. I stented his right coronary artery as a 3.5 mm x 12 mm long Medtronic S7 bare metal stent in December of 2001. At that time he did have a 60% mid dominant RCA stenosis as well as a 50-60% OM2 branch stenosis with normal LV function. He had a negative Myoview Nov 28, 2009 and denies chest pain or shortness of breath. I stented his right subclavian and innominate vessel as well as his right renal artery. He has no known ostial left common carotid artery stenosis and left subclavian artery stenosis as well. He is cutting down his cigarettes from 1 pack per day to 1-2 packs per week and is using an Engineer, manufacturing. His other problem include hypertension and hyperlipidemia. We are following his various Doppler studies in our office.he saw Kerin Ransom PA-C in the office one week ago with complaints of dyspnea or lower extremity edema. Apparently he was advised to liberalize his salt intake. We advised him to avoid salt, and increased his furosemide as well as decreased his amlodipine. He lost 34 pounds. His edema has somewhat improved. We have continued to follow his basic metabolic panel and adjust his diuretics as necessary. He does weigh himself on a daily basis and adjust his diuretics by doubling them for 2-3 days if his weight begins to increase. We've been following his carotid and renal Doppler studies. 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 His right internal carotid artery stenosis has remained stable in the moderately severe range. He denies chest pain, shortness of breath or claudication. He  was seen by Kerin Ransom 06/23/16 and was in atrial fibrillation though he converted. He was begun on oral anticoagulation. He was also recently seen by Lajean Saver MD in the emergency room the following day with shortness of breath and edema. He does admit to dietary indiscretion with regard to salt. He was recently hospitalized 12/17/16 through the 16th expiratory failure, interstitial pneumonia and diastolic heart failure. He was briefly intubated from BiPAP, diuresed and placed on antibiotics and steroid therapy. He was discharged home on continuous O2 therapy.    Current Outpatient Prescriptions  Medication Sig Dispense Refill  . apixaban (ELIQUIS) 5 MG TABS tablet Take 1 tablet (5 mg total) by mouth 2 (two) times daily. 60 tablet 6  . aspirin EC 81 MG tablet Take 81 mg by mouth daily.    [name] atorvastatin (LIPITOR) 40 MG tablet Take 40 mg by mouth daily at 6 PM.     . folic acid (FOLVITE) 1 MG tablet Take 800 mcg by mouth daily.     . furosemide (LASIX) 40 MG tablet Take 2 tablets (80 mg total) by mouth daily. (Patient taking differently: Take 40 mg by mouth See admin instructions. Take 40 mg everyday if weight gain over 3 lbs take an extra tablet in the afternoon) 90 tablet 3  . Garlic 1962 MG CAPS Take 1 capsule by mouth daily.     [name] guaiFENesin-dextromethorphan (ROBITUSSIN DM) 100-10 MG/5ML syrup Take 5 mLs by mouth every 4 (four) hours as needed for cough.  118 mL 0  . ipratropium-albuterol (DUONEB) 0.5-2.5 (3) MG/3ML SOLN Take 3 mLs by nebulization 4 (four) times daily as needed (shortness of breath).    . losartan (COZAAR) 25 MG tablet Take 1 tablet (25 mg total) by mouth daily. 90 tablet 0  . metoprolol tartrate (LOPRESSOR) 50 MG tablet Take 1/2 tablet twice a day 60 tablet 1  . nicotine (NICODERM CQ - DOSED IN MG/24 HOURS) 21 mg/24hr patch Place 1 patch (21 mg total) onto the skin daily. 28 patch 0  . nitroGLYCERIN (NITROSTAT) 0.4 MG SL tablet Place 0.4 mg under the tongue every 5 (five)  minutes as needed for chest pain.    . Omega-3 Fatty Acids (FISH OIL) 300 MG CAPS Take 2,400 mg by mouth daily.     . Polyethylene Glycol 3350 (MIRALAX PO) Take 17 g by mouth daily.     . predniSONE (DELTASONE) 10 MG tablet Take 3 tablets (30 mg total) by mouth daily with breakfast. Take 3 tablets a day for next two days, then 2 tablets a day for two days, then 1 tablet for two days then stop. 12 tablet 0  . PROAIR HFA 108 (90 Base) MCG/ACT inhaler Inhale 2 puffs into the lungs every 6 (six) hours as needed for wheezing or shortness of breath. 1 Inhaler 2  . ranitidine (ZANTAC) 75 MG tablet Take 75 mg by mouth daily.    [name] thiamine 100 MG tablet Take 1 tablet (100 mg total) by mouth daily. 100 tablet 0  . vitamin E (VITAMIN E) 400 UNIT capsule Take 400 Units by mouth daily.     No current facility-administered medications for this visit.     No Known Allergies  Social History   Social History  . Marital status: Married    Spouse name: N/A  . Number of children: N/A  . Years of education: N/A   Occupational History  . Not on file.   Social History Main Topics  . Smoking status: Current Every Day Smoker    Packs/day: 1.00    Types: Cigarettes  . Smokeless tobacco: Never Used     Comment: also electronic cigarettes  . Alcohol use 12.6 oz/week    21 Cans of beer per week     Comment: beer  . Drug use: No  . Sexual activity: Not on file   Other Topics Concern  . Not on file   Social History Narrative  . No narrative on file     Review of Systems: General: negative for chills, fever, night sweats or weight changes.  Cardiovascular: negative for chest pain, dyspnea on exertion, edema, orthopnea, palpitations, paroxysmal nocturnal dyspnea or shortness of breath Dermatological: negative for rash Respiratory: negative for cough or wheezing Urologic: negative for hematuria Abdominal: negative for nausea, vomiting, diarrhea, bright red blood per rectum, melena, or  hematemesis Neurologic: negative for visual changes, syncope, or dizziness All other systems reviewed and are otherwise negative except as noted above.    Blood pressure (!) 105/59, pulse (!) 117, height 5\' 7"  (1.702 m), weight 193 lb (87.5 kg).  General appearance: alert and no distress Neck: no adenopathy, no JVD, supple, symmetrical, trachea midline, thyroid not enlarged, symmetric, no tenderness/mass/nodules and Bilateral carotid bruits Lungs: clear to auscultation bilaterally Heart: regular rate and rhythm, S1, S2 normal, no murmur, click, rub or gallop Extremities: extremities normal, atraumatic, no cyanosis or edema  EKG not performed today  ASSESSMENT AND PLAN:   CAD-RCA BMS 2001, low risk Myoview 2011 History  of CAD status post RCA intervention by myself in December 2001 with a 3.5 mm x 12 mm long Medtronic S7 bare-metal stent. He did have some disease in his OM 2 branch and normal LV function that time. He had a nonischemic my view 11/28/09. He denies chest pain.  PAD- multiple interventions- renal, iliac, SCA History of peripheral arterial disease status post right subclavian, right innominate and right renal artery intervention. He does have known ostial left common carotid artery stenosis. We have been following these by duplex ultrasound.  Tobacco abuse Long history of tobacco abuse having quit 2 weeks ago, prolonged consultation for respiratory failure.  Dyslipidemia, goal LDL below 70 History of dyslipidemia on statin therapy followed by his PCP.  Chronic diastolic heart failure (HCC) History of chronic diastolic heart failure on oral diuretics. He is aware of the importance of salt avoidance. Most recent echo performed 05/24/16 revealed normal LV systolic function.  Essential hypertension History of essential hypertension blood pressure measures 129/44. He is on losartan and metoprolol. Continue current meds at current dosing.  Paroxysmal atrial fibrillation  (HCC) History of paroxysmal atrial fibrillation on Eliquis oral anticoagulation.      Lorretta Harp MD FACP,FACC,FAHA, [facility] 12/28/2016 4:03 PM

## 2016-12-28 NOTE — Assessment & Plan Note (Signed)
History of essential hypertension blood pressure measures 129/44. He is on losartan and metoprolol. Continue current meds at current dosing.

## 2016-12-28 NOTE — Assessment & Plan Note (Signed)
Long history of tobacco abuse having quit 2 weeks ago, prolonged consultation for respiratory failure.

## 2016-12-29 ENCOUNTER — Other Ambulatory Visit: Payer: Self-pay

## 2016-12-29 NOTE — Patient Outreach (Signed)
[facility]) Care Management  12/29/2016  BYRD RUSHLOW [birth year] [id number]  EMMI: COPD Referral date: 12/29/16 Referral source: EMMI copd RED alert Referral reason: # of times rescue inhaler used in past 24 hours: 3 Day # 1  Telephone call to patient to patient regarding EMMI copd red alert.  Unable to reach patient. HIPAA compliant voice message left with call back phone number.    PLAN: RNCM will attempt 2nd telephone call to patient within 1 week.   [name] RN,BSN,CCM [facility] Telephonic  [phone]

## 2016-12-30 ENCOUNTER — Other Ambulatory Visit: Payer: Self-pay

## 2016-12-30 DIAGNOSIS — J449 Chronic obstructive pulmonary disease, unspecified: Secondary | ICD-10-CM | POA: Diagnosis not present

## 2016-12-30 DIAGNOSIS — Z6829 Body mass index (BMI) 29.0-29.9, adult: Secondary | ICD-10-CM | POA: Diagnosis not present

## 2016-12-30 DIAGNOSIS — I509 Heart failure, unspecified: Secondary | ICD-10-CM | POA: Diagnosis not present

## 2016-12-30 NOTE — Patient Outreach (Signed)
[place] [facility]) Care Management  12/30/2016  NOSSON WENDER Oct 16, 1940 [id number]  EMMI: COPD Referral date: 12/29/16 Referral source: EMMI copd RED alert Referral reason: # of times rescue inhaler used in past 24 hours: 3 Day # 1 Attempt #2  Second telephone call to patient to patient regarding EMMI copd red alert.  Unable to reach patient. HIPAA compliant voice message left with call back phone number.    PLAN: RNCM will attempt 3rd telephone call to patient within 1 week.   [name] RN,BSN,CCM [facility] Telephonic  [phone]

## 2017-01-02 ENCOUNTER — Other Ambulatory Visit: Payer: Self-pay

## 2017-01-02 NOTE — Patient Outreach (Signed)
[place] [facility]) Care Management  01/02/2017  LEBERT LOVERN [birth year] [id number]  EMMI: COPD Referral date: 12/29/16 Referral source: EMMI copd RED alert Referral reason: # of times rescue inhaler used in past 24 hours: 3 Day # 1  SUBJECTIVE: Telephone call to patient regarding EMMI stroke red alert.  Contact answering phone states patient would need to call back. HIPAA compliant message given with call back phone number.   PLAN; RNCM will await return call. If no return call RNCM will attempt 2nd telephone outreach within 3 business days.   [name] RN,BSN,CCM [facility] Telephonic  [phone]

## 2017-01-04 ENCOUNTER — Other Ambulatory Visit: Payer: Self-pay

## 2017-01-04 NOTE — Patient Outreach (Addendum)
[place] [facility]) Care Management  01/04/2017  Kevin Watkins 12-18-1940 [id number]   EMMI: COPD Referral date: 12/29/16 AND 01/02/17 Referral source: EMMI copd RED alert Referral reason: # of times rescue inhaler used in past 24 hours: 3 and new problems with medications: YES Day # 1 and #3  SUBJECTIVE: Telephone call to patient regarding EMMI COPD red alerts. HIPAA verified with patient. Patient gave verbal authorization to speak in front of his wife Kevin Watkins regarding all of his personal health information. Wife on the call with patient. Discussed EMMI COPD program with patient. Discussed and offered [facility] care management services. Patient verbally agreed to services.  Patient states he is not having any abnormal shortness of breath. Patient states he is coughing up phlegm.  Patient states he is taking his medications as prescribed. Patient states he had a follow up appointment with his primary MD on 12/30/16 and has seen his cardiologist since discharge. Patient states he has his first appointment with a pulmonologist in July 2018. Patient states he is wearing his oxygen at 2 L around the clock.   Patient reports he has COPD, heart failure. Patient states he feels it will be beneficial to receive additional education regarding his conditions.   ASSESSMENT:  Date of Admission: 12/17/2016  6:08 AM Date of Discharge: 12/24/2016  Discharge Diagnosis: 1. Respiratory Failure 2. Intersitial pneumonia 3. COPD exacerbation 4. CHF exacerbationPatient heard of hearing wears hearing aids.   Per medical record note: 76yo male admitted on 6/9 with worsening dyspnea which he noticed after taking an extra ambien for sleep. He has a PMH of 120+ pack year smoking history, dCHF, CAD s/p stenting, PVD with renal and subclavian stents, A fib, HTN and bil carotid artery disease.  Plan: RNCM will refer patient to health coach for COPD disease management. RNCM will refer patient to pharmacy for  medication review.   Kevin Plowman RN,BSN,CCM [facility] Telephonic  [phone] '

## 2017-01-05 ENCOUNTER — Other Ambulatory Visit: Payer: Self-pay

## 2017-01-05 NOTE — Patient Outreach (Signed)
[facility]) Care Management  01/05/2017  VIKAS WEGMANN Jul 15, 1940 [id number]   EMMI: COPD Referral date: 12/29/16 Referral source: EMMI copd RED alert Referral reason: # of times rescue inhaler used in past 24 hours: 3, Mucus change color: YES Day # 8  EMMI COPD red alert received today for automated call received on 01/04/17.  Patient was called on 01/04/17 and needs were addressed.  PLAN; No additional follow up needed at this time.   [name] RN,BSN,CCM [facility] Telephonic  [phone]

## 2017-01-06 DIAGNOSIS — H35341 Macular cyst, hole, or pseudohole, right eye: Secondary | ICD-10-CM | POA: Diagnosis not present

## 2017-01-12 ENCOUNTER — Other Ambulatory Visit: Payer: Self-pay

## 2017-01-12 DIAGNOSIS — J441 Chronic obstructive pulmonary disease with (acute) exacerbation: Secondary | ICD-10-CM

## 2017-01-12 NOTE — Patient Outreach (Signed)
[place] [facility]) Care Management  01/12/2017  Kevin Watkins [birth year] [id number]   Telephone contact past referral for Health Coach Disease Management for COPD and CHF.  Patient had a recent admission (12-17-16 to 12-24-16) for respiratory failure, interstitial pneumonia, COPD exacerbation, and CHF exacerbation.  Since discharge he has seen his PCP Dr. Helene Kelp on 12-30-16, and his cardiologist Dr. Gwenlyn Found on 12-28-16.  Patient states he has an appointment at [facility] Pulmonology with P. Parrett on 01-17-17.  In-depth explanation of [facility] services provided.  Patient reports he takes his meds as ordered, and understands self-management of heart and lung disease issues.  Patient states he abides by a low salt diet, weighs daily and adjusts his Lasix as directed by Dr. Gwenlyn Found.  Patient states he was discharged on continuous O2 at 2L/min., but he does not feel like he needs it.  He plans to discuss this next week with the pulmonologist.  He states he is having some trouble sleeping, and that he will also discuss this with the doctor next week.  Patient states he doesn't think he needs any [facility] services at this time.  He did agree for me to mail a [facility] pamphlet with contact information to him, and stated he would call if he changes his mind.  Plan:  Notify PCP and CMA of case closure.           Mail letter with pamphlet to patient.  Candie Mile, RN, MSN [name] [phone] Fax [phone]

## 2017-01-12 NOTE — Patient Outreach (Signed)
[facility]) Care Management  01/12/2017  RASEAN JOOS 08-14-1940 [id number]  Nurse call line Referral date 01/09/17 Referral Reason: Patient returning call.  Telephone call to patient. Unable to reach. HIPAA compliant voice message left with call back phone number.   PLAN; RNCM will notify patients assigned health coach of nurse call line referral   [name] RN,BSN,CCM [facility] Telephonic  [phone]

## 2017-01-17 ENCOUNTER — Ambulatory Visit (INDEPENDENT_AMBULATORY_CARE_PROVIDER_SITE_OTHER): Payer: PPO | Admitting: Adult Health

## 2017-01-17 ENCOUNTER — Encounter: Payer: Self-pay | Admitting: Adult Health

## 2017-01-17 ENCOUNTER — Ambulatory Visit (INDEPENDENT_AMBULATORY_CARE_PROVIDER_SITE_OTHER)
Admission: RE | Admit: 2017-01-17 | Discharge: 2017-01-17 | Disposition: A | Discharge status: Home / Self Care | Payer: PPO | Source: Ambulatory Visit | Attending: Adult Health | Admitting: Adult Health

## 2017-01-17 VITALS — BP 110/60 | HR 100 | Ht 67.0 in | Wt 197.0 lb

## 2017-01-17 DIAGNOSIS — J9611 Chronic respiratory failure with hypoxia: Secondary | ICD-10-CM | POA: Diagnosis not present

## 2017-01-17 DIAGNOSIS — J9602 Acute respiratory failure with hypercapnia: Secondary | ICD-10-CM | POA: Diagnosis not present

## 2017-01-17 DIAGNOSIS — J441 Chronic obstructive pulmonary disease with (acute) exacerbation: Secondary | ICD-10-CM

## 2017-01-17 DIAGNOSIS — J9621 Acute and chronic respiratory failure with hypoxia: Secondary | ICD-10-CM | POA: Insufficient documentation

## 2017-01-17 DIAGNOSIS — J9601 Acute respiratory failure with hypoxia: Secondary | ICD-10-CM

## 2017-01-17 DIAGNOSIS — J9600 Acute respiratory failure, unspecified whether with hypoxia or hypercapnia: Secondary | ICD-10-CM | POA: Diagnosis not present

## 2017-01-17 NOTE — Assessment & Plan Note (Signed)
Cont on O2 with act and At bedtime   Goal >90% .

## 2017-01-17 NOTE — Progress Notes (Signed)
@Patient  ID: Kevin Watkins, male    DOB: 16-Jan-1941, 76 y.o.   MRN: [id number]  Chief Complaint  Patient presents with  . Follow-up    COPD     Referring provider: Ronita Hipps, MD  HPI: 76 year old male smoker seen for pulmonary critical care consult during hospitalization June 2018 with acute hypoxic and hypercarbic respiratory failure with suspected COPD exacerbation  TEST  Echo 05/2016 nml LVEF  01/17/2017 Follow up : [facility] follow up  Patient returns for post hospital follow-up. Patient was admitted last month for an acute hypoxic and hypercarbic respiratory failure with suspected COPD exacerbation. Patient was an active smoker and felt to have undiagnosed COPD on admission. Patient presented with progressive shortness of breath. Chest x-ray showed bilateral diffuse infiltrates felt to be representative of possible fluid overload. He was treated initially with BiPAP. However, patient continued to have decompensation and required intubation. ABG reflected hypercarbic respiratory failure. Patient was treated with aggressive diuresis, empiric antibiotics and steroids. Patient was extubated successfully. Patient is on oxygen at home at 1 L at bedtime. He did continue to desaturate on room air. And was started on oxygen at 2 L at discharge. During admission. He was diuresed 5 L negative. Discharge weight was at 189 pounds. Wt avg at home , weighs daily at 189-191lbs. Takes lasix 40mg  daily , takes extra lasix if wt trends up.  Since discharge. Patient is feeling Chest x-ray today shows BB fibrosis w/ mild interstitial edema.  Quit smoking since discharge.  Wearing O2 at 2l/m At bedtime  . Using with actiity As needed  . Sats have been improved in mid 90s on room air.  Smoked 96yr 1/2-2 PPD .  Did telephone work .  Spirometry today shows an FEV1 at 32%, ratio 30, FVC 77%. Flow volume loops were poor.      No Known Allergies  Immunization History  Administered Date(s)  Administered  . Influenza-Unspecified 04/01/2014, 04/07/2015, 03/30/2016  . Pneumococcal-Unspecified 06/21/2010, 01/27/2014  . Td 03/01/2012  . Zoster 03/05/2012    Past Medical History:  Diagnosis Date  . CAD in native artery 06/2000   BMS to the RCA; known 60% mid dominant RCA stenosis and 50-60% OM 2 branch stenosis normal LV function. Last Myoview May 2011 negative for ischemia.  . CHF (congestive heart failure) ([place])   . COPD (chronic obstructive pulmonary disease) ([place])   . Dyslipidemia, goal LDL below 70   . Edema   . EtOH dependence ([place])    Family reported  . Hypertension   . Oxygen deficiency   . PAD (peripheral artery disease) (H[facility])    iliac, SCA, Innominate, and renal PTA  . Tobacco abuse     Tobacco History: History  Smoking Status  . Former Smoker  . Packs/day: 1.00  . Types: Cigarettes  . Quit date: 12/17/2016  Smokeless Tobacco  . Never Used    Comment: also electronic cigarettes   Counseling given: Not Answered   Outpatient Encounter Prescriptions as of 01/17/2017  Medication Sig  . apixaban (ELIQUIS) 5 MG TABS tablet Take 1 tablet (5 mg total) by mouth 2 (two) times daily.  [name] aspirin EC 81 MG tablet Take 81 mg by mouth daily.  [name] atorvastatin (LIPITOR) 40 MG tablet Take 40 mg by mouth daily at 6 PM.   . folic acid (FOLVITE) 1 MG tablet Take 800 mcg by mouth daily.   . furosemide (LASIX) 40 MG tablet Take 2 tablets (80 mg total) by mouth  daily. (Patient taking differently: Take 40 mg by mouth See admin instructions. Take 40 mg everyday if weight gain over 3 lbs take an extra tablet in the afternoon)  . Garlic 8657 MG CAPS Take 1 capsule by mouth daily.   [name] guaiFENesin-dextromethorphan (ROBITUSSIN DM) 100-10 MG/5ML syrup Take 5 mLs by mouth every 4 (four) hours as needed for cough.  [name] ipratropium-albuterol (DUONEB) 0.5-2.5 (3) MG/3ML SOLN Take 3 mLs by nebulization 4 (four) times daily as needed (shortness of breath).  . losartan (COZAAR) 25 MG tablet Take  1 tablet (25 mg total) by mouth daily.  . metoprolol tartrate (LOPRESSOR) 50 MG tablet Take 1/2 tablet twice a day  . nicotine (NICODERM CQ - DOSED IN MG/24 HOURS) 21 mg/24hr patch Place 1 patch (21 mg total) onto the skin daily.  . nitroGLYCERIN (NITROSTAT) 0.4 MG SL tablet Place 0.4 mg under the tongue every 5 (five) minutes as needed for chest pain.  . Omega-3 Fatty Acids (FISH OIL) 300 MG CAPS Take 2,400 mg by mouth daily.   . Polyethylene Glycol 3350 (MIRALAX PO) Take 17 g by mouth daily.   . predniSONE (DELTASONE) 10 MG tablet Take 3 tablets (30 mg total) by mouth daily with breakfast. Take 3 tablets a day for next two days, then 2 tablets a day for two days, then 1 tablet for two days then stop.  [name] PROAIR HFA 108 (90 Base) MCG/ACT inhaler Inhale 2 puffs into the lungs every 6 (six) hours as needed for wheezing or shortness of breath.  . ranitidine (ZANTAC) 75 MG tablet Take 75 mg by mouth daily.  [name] thiamine 100 MG tablet Take 1 tablet (100 mg total) by mouth daily.  . vitamin E (VITAMIN E) 400 UNIT capsule Take 400 Units by mouth daily.   No facility-administered encounter medications on file as of 01/17/2017.      Review of Systems  Constitutional:   No  weight loss, night sweats,  Fevers, chills, +fatigue, or  lassitude.  HEENT:   No headaches,  Difficulty swallowing,  Tooth/dental problems, or  Sore throat,                No sneezing, itching, ear ache, nasal congestion, post nasal drip,   CV:  No chest pain,  Orthopnea, PND, swelling in lower extremities, anasarca, dizziness, palpitations, syncope.   GI  No heartburn, indigestion, abdominal pain, nausea, vomiting, diarrhea, change in bowel habits, loss of appetite, bloody stools.   Resp:   No chest wall deformity  Skin: no rash or lesions.  GU: no dysuria, change in color of urine, no urgency or frequency.  No flank pain, no hematuria   MS:  No joint pain or swelling.  No decreased range of motion.  No back  pain.    Physical Exam  BP 110/60 (BP Location: Left Arm, Cuff Size: Normal)   Pulse 100   Ht 5\' 7"  (1.702 m)   Wt 197 lb (89.4 kg)   SpO2 97%   BMI 30.85 kg/m   GEN: A/Ox3; pleasant , NAD,  Elderly    HEENT:  [place]/AT,  EACs-clear, TMs-wnl, NOSE-clear, THROAT-clear, no lesions, no postnasal drip or exudate noted.   NECK:  Supple w/ fair ROM; no JVD; normal carotid impulses w/o bruits; no thyromegaly or nodules palpated; no lymphadenopathy.    RESP  Decreased BS in bases , i. no accessory muscle use, no dullness to percussion  CARD:  RRR, no m/r/g, tr peripheral edema, pulses intact, no cyanosis  or clubbing.  GI:   Soft & nt; nml bowel sounds; no organomegaly or masses detected.   Musco: Warm bil, no deformities or joint swelling noted.   Neuro: alert, no focal deficits noted.    Skin: Warm, no lesions or rashes     Lab Results:  BMET   BNPProBNP No results found for: PROBNP  Imaging: Dg Chest 2 View  Result Date: 01/17/2017 CLINICAL DATA:  Respiratory failure EXAM: CHEST  2 VIEW COMPARISON:  December 23, 2016 and June 24, 2016 FINDINGS: There is fibrotic type change in the lower lung zones. There is questionable slight interstitial edema superimposed in the bases. The lungs elsewhere are clear. No airspace consolidation. Heart is upper normal in size with pulmonary vascularity within normal limits. There is aortic atherosclerosis. No adenopathy. No bone lesions. There is a right subclavian artery region stent. IMPRESSION: Bibasilar fibrosis. There may be mild chronic interstitial edema superimposed. No airspace consolidation. Heart upper normal in size. There is aortic atherosclerosis. Right subclavian stent present. Aortic Atherosclerosis (ICD10-I70.0). Electronically Signed   By: Lowella Grip III M.D.   On: 01/17/2017 16:20   Dg Chest Port 1 View  Result Date: 12/23/2016 CLINICAL DATA:  Acute respiratory failure, coronary artery disease, COPD, CHF, current  smoker. EXAM: PORTABLE CHEST 1 VIEW COMPARISON:  Portable chest x-ray of December 22, 2016 FINDINGS: The lungs are well-expanded. The interstitial markings are mildly increased. The heart and pulmonary vascularity are normal. There is calcification in the wall of the aortic arch. No significant pleural effusion is observed. There is no pneumothorax. IMPRESSION: Mildly increased interstitial markings are consistent with persistent interstitial edema or pneumonia. Underlying changes of COPD. Thoracic aortic atherosclerosis. Electronically Signed   By: David  Martinique M.D.   On: 12/23/2016 07:22   Dg Chest Port 1 View  Result Date: 12/22/2016 CLINICAL DATA:  Pulmonary edema. EXAM: PORTABLE CHEST 1 VIEW COMPARISON:  12/21/2016 FINDINGS: Enteric tube has been removed. Brachiocephalic and right subclavian artery stents are again seen. The cardiac silhouette remains mildly enlarged. Aortic atherosclerosis is noted. Perihilar and bibasilar interstitial densities have mildly improved with improved aeration of the lung bases. There may be a trace residual right pleural effusion. No pneumothorax is identified. IMPRESSION: Decreased interstitial edema with improved basilar lung aeration. Electronically Signed   By: Logan Bores M.D.   On: 12/22/2016 07:13   Dg Chest Port 1 View  Result Date: 12/21/2016 CLINICAL DATA:  Endotracheal tube. EXAM: PORTABLE CHEST 1 VIEW COMPARISON:  Radiographs of December 20, 2016. FINDINGS: Stable cardiomegaly and central pulmonary vascular congestion is noted. Atherosclerosis of thoracic aorta is noted. Mild bilateral perihilar and basilar edema is noted with minimal bilateral pleural effusions. Endotracheal and nasogastric tubes are unchanged in position. No pneumothorax is noted. Bony thorax is unremarkable. IMPRESSION: Stable support apparatus. Stable bilateral pulmonary edema with minimal associated pleural effusions. Electronically Signed   By: Marijo Conception, M.D.   On: 12/21/2016 07:11    Dg Chest Port 1 View  Result Date: 12/20/2016 CLINICAL DATA:  76 year old male with respiratory failure. Subsequent encounter. EXAM: PORTABLE CHEST 1 VIEW COMPARISON:  12/19/2016. FINDINGS: Endotracheal tube tip 2.2 cm above the carina. Nasogastric tube courses below the diaphragm. Tip is not included on the present exam. Stents right subclavian artery level. Calcified aorta. Pulmonary vascular congestion/ mild pulmonary edema superimposed upon chronic lung changes without significant change. IMPRESSION: Pulmonary vascular congestion/ mild pulmonary edema superimposed upon chronic lung changes without significant change. Aortic atherosclerosis. Electronically Signed  By: Genia Del M.D.   On: 12/20/2016 07:02   Dg Chest Port 1 View  Result Date: 12/19/2016 CLINICAL DATA:  Intubated patient, acute respiratory failure, COPD, chronic CHF, peripheral vascular disease. EXAM: PORTABLE CHEST 1 VIEW COMPARISON:  Portable chest x-ray of December 19, 2016 at 4:59 a.m. FINDINGS: The patient has undergone interval intubation of the trachea and esophagus. The endotracheal tube tip lies 5 cm above the carina. The esophagogastric tube tip projects below the inferior margin of the image. The heart and pulmonary vascularity are normal. There is calcification in the wall of the thoracic aorta. The lung markings are coarse at both bases greatest on the right. IMPRESSION: COPD. Improved pulmonary interstitial edema. Probable bibasilar subsegmental atelectasis greatest on the right. No significant pleural effusion and no pneumothorax. The support tubes are in reasonable position. Thoracic aortic atherosclerosis. Electronically Signed   By: David  Martinique M.D.   On: 12/19/2016 07:47   Dg Chest Port 1 View  Result Date: 12/19/2016 CLINICAL DATA:  Initial evaluation for acute hypoxia, dyspnea. EXAM: PORTABLE CHEST 1 VIEW COMPARISON:  Prior radiograph from 12/18/2016. FINDINGS: Moderate cardiomegaly, stable. Mediastinal  silhouette within normal limits. Aortic atherosclerosis. Lungs normally inflated. Slight interval worsening in diffuse pulmonary vascular congestion with diffuse interstitial prominence, suggesting worsened pulmonary interstitial edema. Underlying COPD. No definite focal infiltrates, although superimposed infection at the right lung base would be difficult to exclude. No obvious pleural effusion. No pneumothorax. No acute osseus abnormality. IMPRESSION: 1. Slight interval worsening in diffuse pulmonary vascular congestion with interstitial prominence, suggesting worsened pulmonary interstitial edema. 2. Underlying COPD. 3. Cardiomegaly with aortic atherosclerosis. Electronically Signed   By: Jeannine Boga M.D.   On: 12/19/2016 05:13     Assessment & Plan:   No problem-specific Assessment & Plan notes found for this encounter.     Rexene Edison, NP 01/17/2017

## 2017-01-17 NOTE — Assessment & Plan Note (Signed)
Recent flare -severe with fluid overload +/- interstial pneumonia  Pt was a very heavy smoker .cessation discussed.  Spirometry showed severe COPD  Will try [place]er[place]  Patient Instructions  Begin ANORO daily . Rinse after use. May use Duoneb -this is as needed only . Use for wheezing /shortness of breath .  Refer to pulmonary rehabilitation.-call back if you are interested.  Check with Primary MD regarding your pneumonia vaccines.  Wear oxygen 2l/m with activity As needed  And At bedtime  . Keep oxygen level >90% Great job on not smoking .  Follow up with Dr. Lamonte Sakai in 2 months with PFT and As needed   Please contact office for sooner follow up if symptoms do not improve or worsen or seek emergency care

## 2017-01-17 NOTE — Patient Instructions (Addendum)
Begin ANORO daily . Rinse after use. May use Duoneb -this is as needed only . Use for wheezing /shortness of breath .  Refer to pulmonary rehabilitation.-call back if you are interested.  Check with Primary MD regarding your pneumonia vaccines.  Wear oxygen 2l/m with activity As needed  And At bedtime  . Keep oxygen level >90% Great job on not smoking .  Follow up with Kevin Watkins in 2 months with PFT and As needed   Please contact office for sooner follow up if symptoms do not improve or worsen or seek emergency care

## 2017-01-18 MED ORDER — UMECLIDINIUM-VILANTEROL 62.5-25 MCG/INH IN AEPB: 1.0000 | INHALATION_SPRAY | Freq: Every day | RESPIRATORY_TRACT | 0 refills | Status: DC

## 2017-01-18 MED ORDER — UMECLIDINIUM-VILANTEROL 62.5-25 MCG/INH IN AEPB: 1.0000 | INHALATION_SPRAY | Freq: Every day | RESPIRATORY_TRACT | 3 refills | Status: DC

## 2017-01-18 NOTE — Addendum Note (Signed)
Addended by: Parke Poisson E on: 01/18/2017 09:44 AM   Modules accepted: Orders

## 2017-01-18 NOTE — Progress Notes (Signed)
Patient seen in the office today and instructed on use of Anoro.  Patient expressed understanding and demonstrated technique. Parke Poisson, CMA 01/17/2017

## 2017-01-19 ENCOUNTER — Other Ambulatory Visit: Payer: Self-pay | Admitting: Pharmacist

## 2017-01-19 ENCOUNTER — Telehealth: Payer: Self-pay | Admitting: Adult Health

## 2017-01-19 DIAGNOSIS — J438 Other emphysema: Secondary | ICD-10-CM

## 2017-01-19 NOTE — Telephone Encounter (Signed)
Spoke with pt. States that he would like to be enrolled in pulmonary rehab. TP advised him to let us know about this. Pt lives in [place] and would like attend pulmonary rehab at [place] if possible.  TP - please advise. Thanks.

## 2017-01-19 NOTE — Patient Outreach (Signed)
[place] [facility]) Care Management  01/19/2017  Kevin Watkins 09-06-1940 [id number]  Patient was referred to [place] Pharmacist by [place] for polypharmacy.  Phone call to patient, HIPAA compliant message left.  Patient returned call.  HIPAA details were verified by patient.    Explained purpose of call to review his medications following his discharge---he had previously declined [place] from [employer].   Patient was confused about purpose of call---he asked about home health---was counseled to discuss with his PCP.   He asked about pulmonary rehab---was counseled to follow-up with his pulmonology provider per the after visit summary T Parrett, NP office provided him with.   He did not wish to review all of his mediations.  He asked about the difference between Anoro and DuoNebs, reports Anoro was started and he is using DuoNebs as needed.   Counseled patient [name] is a long acting medication to help his breathing and DuoNebs are for when he gets short of breath as it works quicker to help shortness of breath.    He asked about sleep medication---reports zolpidem was discontinued on discharge and reports pulmonology suggested melatonin.  He was counseled there aren't many medications for sleep and melatonin can take time to work---he states he has only used for 2-3 days.  Patient denied other medication related questions at time of call.   Plan:  Will not open pharmacy episode as patient did not wish to review medications and denied other pharmacy related needs.    Patient counseled he can contact [facility] Pharmacist if new pharmacy needs arise.   [name], PharmD, [place] [phone]

## 2017-01-20 ENCOUNTER — Telehealth: Payer: Self-pay | Admitting: Adult Health

## 2017-01-20 NOTE — Telephone Encounter (Signed)
Per TP: okay for pulmonary rehab

## 2017-01-20 NOTE — Telephone Encounter (Signed)
lmtcb x1 for pt. 

## 2017-01-20 NOTE — Telephone Encounter (Signed)
Spoke with pt, who states his insurance does not cover Anoro. I have spoken with Hoyle Sauer with [facility], who states Advair is a covered alternative.  Hoyle Sauer states she will fax over PA request. Pt states he has 2-3 puffs left on sample he was provided with during his OV on 01/17/17. Pt request samples of Anoro if TP wishes to proceed with PA.  Pt request response today if at all possible.   TP please advise if okay to switch to Advair or start PA? Thanks.

## 2017-01-20 NOTE — Telephone Encounter (Signed)
Per TP: prefer Anoro over Advair.  Please start PA  LMOM TCB x1 for pt to discuss the above and offer additional samples.

## 2017-01-23 ENCOUNTER — Other Ambulatory Visit: Payer: Self-pay

## 2017-01-23 ENCOUNTER — Telehealth: Payer: Self-pay | Admitting: Adult Health

## 2017-01-23 NOTE — Patient Outreach (Signed)
[name]) Care Management  01/23/2017  Kevin Watkins 11/30/1940 [id number]  EMMI:  Heart failure Referral date: 01/23/17 Referral source: EMMI heart failure red alert Referral reason: New worsening symptoms: YES,  New shortness of breath: YES,  Day # 26  Telephone call to patient regarding EMMI heart failure red alert. HIPAA verified with patient. Discussed EMMI heart failure calls with patient. Patient states he is not having any new symptoms. Patient states he has some shortness of breath when he gets up and down and he has some dizziness. Patient states his doctor has changed his medications related to the dizziness. Patient states he weighs daily and takes his medications as prescribed. Patient states he does not have any further needs at this time.  RNCM advised patient to call his doctor for 3lbs weight gain overnight or 5 lbs in a week. Patient states he is aware. [name] RNCM advised patient to notify MD of any changes in condition prior to scheduled appointment. RNCM verified patient aware of 911 services for urgent/ emergent needs.  Patient verifies he has the  24 hour nurse call line number Patient request to have EMMI heart failure calls stopped.   PLAN: RNCM will refer patient to care management assistant to close due to refusal of services and to discontinue EMMI heart failure calls. RNCM will send patients doctor closure letter.   [name] RN,BSN,CCM [facility] Telephonic  [phone]

## 2017-01-23 NOTE — Addendum Note (Signed)
Addended by: Virgel Manifold on: 01/23/2017 11:38 AM   Modules accepted: Level of Service, SmartSet

## 2017-01-23 NOTE — Telephone Encounter (Signed)
ATC to call but received a message that all reps were unavailable and to leave a message. Left a message for [facility] to call back.

## 2017-01-23 NOTE — Telephone Encounter (Signed)
Called pt's insurance company >> [phone]. Pt's ID: [id number]. Spoke with Lennette Bihari. PA can't completed over the phone. The form is going to be faxed to triage. Will await fax.

## 2017-01-23 NOTE — Telephone Encounter (Signed)
Patient calling back regarding Anoro - pt still waiting on authorization for medication and would like for [place] to call his pcp Dr. Helene Kelp [phone] and have them give him samples so that he doesn't have to drive to [facility] - pt can be reached at [phone]

## 2017-01-23 NOTE — Telephone Encounter (Signed)
Spoke with pt. He is aware that we will making the referral to pulmonary rehab. Order has been placed under RB as he has an upcoming appointment with him. Nothing further was needed.

## 2017-01-23 NOTE — Telephone Encounter (Signed)
Lennette Bihari called back and stated that call with Ria Comment was disconnected and that he will fax over PA to be faxed. Still have not received the form as of yet. Will await fax.

## 2017-01-23 NOTE — Progress Notes (Signed)
This encounter was created in error - please disregard.

## 2017-01-24 NOTE — Telephone Encounter (Signed)
2nd message left at number listed on previous message along with [facility] contact info. Also PA resubmitted via covermymeds.

## 2017-01-24 NOTE — Telephone Encounter (Signed)
Will route to my box to completed the PA for this medication this afternoon.

## 2017-01-24 NOTE — Telephone Encounter (Signed)
Called [employer] and spoke with Shirlean Mylar who changed PA to high priority. Additional information has been given. case # [id number]  22 hrs left on the case. Nothing further needed until approval/denial.

## 2017-01-25 NOTE — Telephone Encounter (Signed)
Patient is returning phone call [phone].

## 2017-01-25 NOTE — Telephone Encounter (Signed)
Spoke with pt and he is aware that the PA is approved, he is still concerned about the out of pocket cost. Informed the patient he can reach out to his pharmacy and then they are able to inform him of out of pocket cost. Informed the patient that there are ways for patient assistant programs that he could try to apply for in necessary. Pt states he does not live close to [facility] and he would prefer if he needed to obtain samples to get from his pcp. Pt states he will call his pharmacy and then contact us if he is unable to afford his inhaler. Will await his call to assure he does not need anything further from [place]

## 2017-01-25 NOTE — Telephone Encounter (Signed)
I have called to have the PA form refaxed as we have not received this form yet. Was advised that :  PA approved until 07/10/2017.    I have called and lmomtcb x 1 for the pt to make him aware of PA that has been approved.

## 2017-01-26 DIAGNOSIS — I5032 Chronic diastolic (congestive) heart failure: Secondary | ICD-10-CM | POA: Diagnosis not present

## 2017-01-26 DIAGNOSIS — R0609 Other forms of dyspnea: Secondary | ICD-10-CM | POA: Diagnosis not present

## 2017-01-26 DIAGNOSIS — J449 Chronic obstructive pulmonary disease, unspecified: Secondary | ICD-10-CM | POA: Diagnosis not present

## 2017-01-26 MED ORDER — UMECLIDINIUM-VILANTEROL 62.5-25 MCG/INH IN AEPB: 1.0000 | INHALATION_SPRAY | Freq: Every day | RESPIRATORY_TRACT | 4 refills | Status: DC

## 2017-01-26 NOTE — Telephone Encounter (Signed)
Called patient's pharmacy [facility] and spoke with [place] who reported pt's spouse picked up the Rx yesterday, copay was $85.  Called spoke with patient to discuss his cost Pt stated that he likes the Anoro but is ery concerned with the cost, especially when he enters the donut hole - cost will be $400.    Patient asked about changing to a cheaper alternative.  Advised patient that TP prefers he take the Anoro. Offered to begin patient assistance - pt agreeable Process begun, needs a Rx signed by the provider and TP does not return to the office until Monday 7.23.18 Rx placed in TP lookat, will mail form to patient once done

## 2017-01-27 DIAGNOSIS — J449 Chronic obstructive pulmonary disease, unspecified: Secondary | ICD-10-CM | POA: Diagnosis not present

## 2017-01-27 DIAGNOSIS — R0609 Other forms of dyspnea: Secondary | ICD-10-CM | POA: Diagnosis not present

## 2017-01-27 DIAGNOSIS — I5032 Chronic diastolic (congestive) heart failure: Secondary | ICD-10-CM | POA: Diagnosis not present

## 2017-02-01 DIAGNOSIS — J438 Other emphysema: Secondary | ICD-10-CM | POA: Diagnosis not present

## 2017-02-02 DIAGNOSIS — H35373 Puckering of macula, bilateral: Secondary | ICD-10-CM | POA: Diagnosis not present

## 2017-02-02 NOTE — Telephone Encounter (Signed)
Kevin Watkins - have these forms been mailed out? Thanks.

## 2017-02-02 NOTE — Telephone Encounter (Signed)
No - awaiting TP's signature.

## 2017-02-03 NOTE — Telephone Encounter (Signed)
Anoro Rx has been signed by TP Placing in the mail for patient - he was already aware it would be mailed out this week Will sign off

## 2017-02-08 DIAGNOSIS — J438 Other emphysema: Secondary | ICD-10-CM | POA: Diagnosis not present

## 2017-02-26 DIAGNOSIS — R0609 Other forms of dyspnea: Secondary | ICD-10-CM | POA: Diagnosis not present

## 2017-02-26 DIAGNOSIS — I5032 Chronic diastolic (congestive) heart failure: Secondary | ICD-10-CM | POA: Diagnosis not present

## 2017-02-26 DIAGNOSIS — J449 Chronic obstructive pulmonary disease, unspecified: Secondary | ICD-10-CM | POA: Diagnosis not present

## 2017-02-27 DIAGNOSIS — J449 Chronic obstructive pulmonary disease, unspecified: Secondary | ICD-10-CM | POA: Diagnosis not present

## 2017-02-27 DIAGNOSIS — I5032 Chronic diastolic (congestive) heart failure: Secondary | ICD-10-CM | POA: Diagnosis not present

## 2017-02-27 DIAGNOSIS — R0609 Other forms of dyspnea: Secondary | ICD-10-CM | POA: Diagnosis not present

## 2017-03-09 ENCOUNTER — Ambulatory Visit (HOSPITAL_COMMUNITY)
Admission: RE | Admit: 2017-03-09 | Discharge: 2017-03-09 | Disposition: A | Discharge status: Home / Self Care | Payer: PPO | Source: Ambulatory Visit | Attending: Cardiology | Admitting: Cardiology

## 2017-03-09 DIAGNOSIS — J449 Chronic obstructive pulmonary disease, unspecified: Secondary | ICD-10-CM | POA: Diagnosis not present

## 2017-03-09 DIAGNOSIS — I10 Essential (primary) hypertension: Secondary | ICD-10-CM | POA: Diagnosis not present

## 2017-03-09 DIAGNOSIS — I774 Celiac artery compression syndrome: Secondary | ICD-10-CM | POA: Insufficient documentation

## 2017-03-09 DIAGNOSIS — I701 Atherosclerosis of renal artery: Secondary | ICD-10-CM | POA: Insufficient documentation

## 2017-03-09 DIAGNOSIS — I2510 Atherosclerotic heart disease of native coronary artery without angina pectoris: Secondary | ICD-10-CM | POA: Insufficient documentation

## 2017-03-09 DIAGNOSIS — I739 Peripheral vascular disease, unspecified: Secondary | ICD-10-CM

## 2017-03-09 DIAGNOSIS — Z87891 Personal history of nicotine dependence: Secondary | ICD-10-CM | POA: Diagnosis not present

## 2017-03-09 DIAGNOSIS — E785 Hyperlipidemia, unspecified: Secondary | ICD-10-CM | POA: Insufficient documentation

## 2017-03-14 DIAGNOSIS — J438 Other emphysema: Secondary | ICD-10-CM | POA: Diagnosis not present

## 2017-03-22 ENCOUNTER — Other Ambulatory Visit: Payer: Self-pay | Admitting: Emergency Medicine

## 2017-03-23 ENCOUNTER — Ambulatory Visit (INDEPENDENT_AMBULATORY_CARE_PROVIDER_SITE_OTHER): Payer: PPO | Admitting: Emergency Medicine

## 2017-03-23 ENCOUNTER — Other Ambulatory Visit: Payer: Self-pay | Admitting: Emergency Medicine

## 2017-03-23 ENCOUNTER — Encounter: Payer: Self-pay | Admitting: Emergency Medicine

## 2017-03-23 DIAGNOSIS — J9611 Chronic respiratory failure with hypoxia: Secondary | ICD-10-CM

## 2017-03-23 DIAGNOSIS — R0600 Dyspnea, unspecified: Secondary | ICD-10-CM | POA: Diagnosis not present

## 2017-03-23 DIAGNOSIS — J449 Chronic obstructive pulmonary disease, unspecified: Secondary | ICD-10-CM

## 2017-03-23 LAB — PULMONARY FUNCTION TEST
DL/VA % pred: 86 %
DL/VA: 3.88 ml/min/mmHg/L
DLCO cor % pred: 51 %
DLCO cor: 15.23 ml/min/mmHg
DLCO unc % pred: 45 %
DLCO unc: 13.36 ml/min/mmHg
FEF 25-75 Post: 0.99 L/sec
FEF 25-75 Pre: 0.68 L/sec
FEF2575-%Change-Post: 44 %
FEF2575-%Pred-Post: 51 %
FEF2575-%Pred-Pre: 35 %
FEV1-%Change-Post: 8 %
FEV1-%Pred-Post: 57 %
FEV1-%Pred-Pre: 53 %
FEV1-Post: 1.56 L
FEV1-Pre: 1.44 L
FEV1FVC-%Change-Post: 3 %
FEV1FVC-%Pred-Pre: 92 %
FEV6-%Change-Post: 7 %
FEV6-%Pred-Post: 64 %
FEV6-%Pred-Pre: 59 %
FEV6-Post: 2.28 L
FEV6-Pre: 2.12 L
FEV6FVC-%Change-Post: 2 %
FEV6FVC-%Pred-Post: 107 %
FEV6FVC-%Pred-Pre: 104 %
FVC-%Change-Post: 4 %
FVC-%Pred-Post: 59 %
FVC-%Pred-Pre: 57 %
FVC-Post: 2.28 L
FVC-Pre: 2.18 L
Post FEV1/FVC ratio: 69 %
Post FEV6/FVC ratio: 100 %
Pre FEV1/FVC ratio: 66 %
Pre FEV6/FVC Ratio: 97 %
RV % pred: 147 %
RV: 3.66 L
TLC % pred: 96 %
TLC: 6.45 L

## 2017-03-23 NOTE — Assessment & Plan Note (Signed)
Continue your Anoro every day Keep Ventolin available to use 2 puffs up to every 4 hours if needed for shortness of breath.  Use DuoNeb as needed for shortness of breath. Don't use any more frequently than every 6 hours.  Flu shot today.  Prevnar -13 pneumonia shot today.  Follow with Dr Lamonte Sakai in 6 months or sooner if you have any problems

## 2017-03-23 NOTE — Progress Notes (Signed)
@Patient  ID: Kevin Watkins, male    DOB: [birth year], 76 y.o.   MRN: [id number]  Chief Complaint  Patient presents with  . Follow-up    review PFT results-    Referring provider: Ronita Hipps, MD  HPI: 76 year old male smoker seen for pulmonary critical care consult during hospitalization June 2018 with acute hypoxic and hypercarbic respiratory failure with suspected COPD exacerbation  TEST  Echo 05/2016 nml LVEF  Hospital f/u  Patient returns for post hospital follow-up. Patient was admitted last month for an acute hypoxic and hypercarbic respiratory failure with suspected COPD exacerbation. Patient was an active smoker and felt to have undiagnosed COPD on admission. Patient presented with progressive shortness of breath. Chest x-ray showed bilateral diffuse infiltrates felt to be representative of possible fluid overload. He was treated initially with BiPAP. However, patient continued to have decompensation and required intubation. ABG reflected hypercarbic respiratory failure. Patient was treated with aggressive diuresis, empiric antibiotics and steroids. Patient was extubated successfully. Patient is on oxygen at home at 1 L at bedtime. He did continue to desaturate on room air. And was started on oxygen at 2 L at discharge. During admission. He was diuresed 5 L negative. Discharge weight was at 189 pounds. Wt avg at home , weighs daily at 189-191lbs. Takes lasix 40mg  daily , takes extra lasix if wt trends up.  Since discharge. Patient is feeling Chest x-ray today shows BB fibrosis w/ mild interstitial edema.  Quit smoking since discharge.  Wearing O2 at 2l/m At bedtime  . Using with actiity As needed  . Sats have been improved in mid 90s on room air.  Smoked 18yr 1/2-2 PPD .  Did telephone work .  Spirometry today shows an FEV1 at 32%, ratio 30, FVC 77%. Flow volume loops were poor.  ROV 03/23/17 -- Patient has a history of former tobacco use (recently quit), COPD, hypoxemic  respiratory failure. He underwent pulmonary function testing today that I have reviewed. This shows severe obstruction with an FEV1 1.44 L (53% predicted), no bronchodilator response, hyperinflated lung volumes, decreased diffusion capacity that corrects to the normal range when adjusted for alveolar volume. He is currently managed on Anoro, believes that it helps him. He uses DuoNeb rarely, uses proAir about once a week.  He is using oxygen at night. sometimes w exertion.     No Known Allergies  Immunization History  Administered Date(s) Administered  . Influenza-Unspecified 04/01/2014, 04/07/2015, 03/30/2016  . Pneumococcal-Unspecified 06/21/2010, 01/27/2014  . Td 03/01/2012  . Zoster 03/05/2012    Past Medical History:  Diagnosis Date  . CAD in native artery 06/2000   BMS to the RCA; known 60% mid dominant RCA stenosis and 50-60% OM 2 branch stenosis normal LV function. Last Myoview May 2011 negative for ischemia.  . CHF (congestive heart failure) ([place])   . COPD (chronic obstructive pulmonary disease) ([place])   . Dyslipidemia, goal LDL below 70   . Edema   . EtOH dependence ([place])    Family reported  . Hypertension   . Oxygen deficiency   . PAD (peripheral artery disease) (HCC)    iliac, SCA, Innominate, and renal PTA  . Tobacco abuse     Tobacco History: History  Smoking Status  . Former Smoker  . Packs/day: 1.00  . Types: Cigarettes  . Quit date: 12/17/2016  Smokeless Tobacco  . Never Used    Comment: also electronic cigarettes   Counseling given: Not Answered   Outpatient Encounter Prescriptions as of  03/23/2017  Medication Sig  . apixaban (ELIQUIS) 5 MG TABS tablet Take 1 tablet (5 mg total) by mouth 2 (two) times daily.  [name] aspirin EC 81 MG tablet Take 81 mg by mouth daily.  [name] atorvastatin (LIPITOR) 40 MG tablet Take 40 mg by mouth daily at 6 PM.   . folic acid (FOLVITE) 1 MG tablet Take 800 mcg by mouth daily.   . furosemide (LASIX) 40 MG tablet Take 2 tablets (80  mg total) by mouth daily. (Patient taking differently: Take 40 mg by mouth See admin instructions. Take 40 mg everyday if weight gain over 3 lbs take an extra tablet in the afternoon)  . Garlic 1443 MG CAPS Take 1 capsule by mouth daily.   [name] guaiFENesin-dextromethorphan (ROBITUSSIN DM) 100-10 MG/5ML syrup Take 5 mLs by mouth every 4 (four) hours as needed for cough.  [name] ipratropium-albuterol (DUONEB) 0.5-2.5 (3) MG/3ML SOLN Take 3 mLs by nebulization 4 (four) times daily as needed (shortness of breath).  . losartan (COZAAR) 25 MG tablet Take 1 tablet (25 mg total) by mouth daily.  . metoprolol tartrate (LOPRESSOR) 50 MG tablet Take 1/2 tablet twice a day  . nicotine (NICODERM CQ - DOSED IN MG/24 HOURS) 21 mg/24hr patch Place 1 patch (21 mg total) onto the skin daily.  . nitroGLYCERIN (NITROSTAT) 0.4 MG SL tablet Place 0.4 mg under the tongue every 5 (five) minutes as needed for chest pain.  . Omega-3 Fatty Acids (FISH OIL) 300 MG CAPS Take 2,400 mg by mouth daily.   . Polyethylene Glycol 3350 (MIRALAX PO) Take 17 g by mouth daily.   . predniSONE (DELTASONE) 10 MG tablet Take 3 tablets (30 mg total) by mouth daily with breakfast. Take 3 tablets a day for next two days, then 2 tablets a day for two days, then 1 tablet for two days then stop.  [name] PROAIR HFA 108 (90 Base) MCG/ACT inhaler Inhale 2 puffs into the lungs every 6 (six) hours as needed for wheezing or shortness of breath.  . ranitidine (ZANTAC) 75 MG tablet Take 75 mg by mouth daily.  [name] thiamine 100 MG tablet Take 1 tablet (100 mg total) by mouth daily.  [name] umeclidinium-vilanterol (ANORO ELLIPTA) 62.5-25 MCG/INH AEPB Inhale 1 puff into the lungs daily.  [name] umeclidinium-vilanterol (ANORO ELLIPTA) 62.5-25 MCG/INH AEPB Inhale 1 puff into the lungs daily.  . vitamin E (VITAMIN E) 400 UNIT capsule Take 400 Units by mouth daily.   No facility-administered encounter medications on file as of 03/23/2017.      Review of Systems As per  HPI   Physical Exam  BP 130/70 (BP Location: Left Arm, Cuff Size: Normal)   Pulse (!) 52   Ht 5\' 8"  (1.727 m)   Wt 208 lb (94.3 kg)   SpO2 92%   BMI 31.63 kg/m   Gen: Pleasant, well-nourished, in no distress,  normal affect  ENT: No lesions,  mouth clear,  oropharynx clear, no postnasal drip  Neck: No JVD, no TMG, no carotid bruits  Lungs: No use of accessory muscles, distant, no wheezing  Cardiovascular: RRR, heart sounds normal, no murmur or gallops, no peripheral edema  Musculoskeletal: No deformities, no cyanosis or clubbing  Neuro: alert, non focal  Skin: Warm, no lesions or rashes         Assessment & Plan:   COPD (chronic obstructive pulmonary disease) (HCC) Continue your Anoro every day Keep Ventolin available to use 2 puffs up to every 4 hours if needed for  shortness of breath.  Use DuoNeb as needed for shortness of breath. Don't use any more frequently than every 6 hours.  Flu shot today.  Prevnar -13 pneumonia shot today.  Follow with Dr Lamonte Sakai in 6 months or sooner if you have any problems  Chronic respiratory failure with hypoxia ([place]) Continue your oxygen at night as you have been using  Walking oximetry on room air today.   Baltazar Apo, MD, PhD 03/23/2017, 12:48 PM [facility] [phone] or if no answer [phone]

## 2017-03-23 NOTE — Progress Notes (Signed)
PFT done today. 

## 2017-03-23 NOTE — Assessment & Plan Note (Signed)
Continue your oxygen at night as you have been using  Walking oximetry on room air today.

## 2017-03-23 NOTE — Patient Instructions (Addendum)
Continue your oxygen at night as you have been using  Walking oximetry on room air today.  Continue your Anoro every day Keep Ventolin available to use 2 puffs up to every 4 hours if needed for shortness of breath.  Use DuoNeb as needed for shortness of breath. Don't use any more frequently than every 6 hours.  Flu shot today.  Prevnar -13 pneumonia shot today.  Follow with Dr Lamonte Sakai in 6 months or sooner if you have any problems

## 2017-03-29 DIAGNOSIS — R0609 Other forms of dyspnea: Secondary | ICD-10-CM | POA: Diagnosis not present

## 2017-03-29 DIAGNOSIS — J449 Chronic obstructive pulmonary disease, unspecified: Secondary | ICD-10-CM | POA: Diagnosis not present

## 2017-03-29 DIAGNOSIS — I5032 Chronic diastolic (congestive) heart failure: Secondary | ICD-10-CM | POA: Diagnosis not present

## 2017-03-30 DIAGNOSIS — I5032 Chronic diastolic (congestive) heart failure: Secondary | ICD-10-CM | POA: Diagnosis not present

## 2017-03-30 DIAGNOSIS — R0609 Other forms of dyspnea: Secondary | ICD-10-CM | POA: Diagnosis not present

## 2017-03-30 DIAGNOSIS — J449 Chronic obstructive pulmonary disease, unspecified: Secondary | ICD-10-CM | POA: Diagnosis not present

## 2017-03-31 DIAGNOSIS — Z23 Encounter for immunization: Secondary | ICD-10-CM | POA: Diagnosis not present

## 2017-04-10 ENCOUNTER — Ambulatory Visit: Payer: PPO | Admitting: Pulmonary Disease

## 2017-04-10 DIAGNOSIS — J438 Other emphysema: Secondary | ICD-10-CM | POA: Diagnosis not present

## 2017-04-27 DIAGNOSIS — Z0000 Encounter for general adult medical examination without abnormal findings: Secondary | ICD-10-CM | POA: Diagnosis not present

## 2017-04-27 DIAGNOSIS — Z6832 Body mass index (BMI) 32.0-32.9, adult: Secondary | ICD-10-CM | POA: Diagnosis not present

## 2017-04-27 DIAGNOSIS — J189 Pneumonia, unspecified organism: Secondary | ICD-10-CM | POA: Diagnosis not present

## 2017-04-28 DIAGNOSIS — R0609 Other forms of dyspnea: Secondary | ICD-10-CM | POA: Diagnosis not present

## 2017-04-28 DIAGNOSIS — J449 Chronic obstructive pulmonary disease, unspecified: Secondary | ICD-10-CM | POA: Diagnosis not present

## 2017-04-28 DIAGNOSIS — I5032 Chronic diastolic (congestive) heart failure: Secondary | ICD-10-CM | POA: Diagnosis not present

## 2017-04-29 DIAGNOSIS — R0609 Other forms of dyspnea: Secondary | ICD-10-CM | POA: Diagnosis not present

## 2017-04-29 DIAGNOSIS — J449 Chronic obstructive pulmonary disease, unspecified: Secondary | ICD-10-CM | POA: Diagnosis not present

## 2017-04-29 DIAGNOSIS — I5032 Chronic diastolic (congestive) heart failure: Secondary | ICD-10-CM | POA: Diagnosis not present

## 2017-05-08 DIAGNOSIS — J189 Pneumonia, unspecified organism: Secondary | ICD-10-CM | POA: Diagnosis not present

## 2017-05-08 DIAGNOSIS — Z6832 Body mass index (BMI) 32.0-32.9, adult: Secondary | ICD-10-CM | POA: Diagnosis not present

## 2017-05-29 ENCOUNTER — Telehealth: Payer: Self-pay | Admitting: Emergency Medicine

## 2017-05-29 DIAGNOSIS — R0609 Other forms of dyspnea: Secondary | ICD-10-CM | POA: Diagnosis not present

## 2017-05-29 DIAGNOSIS — J449 Chronic obstructive pulmonary disease, unspecified: Secondary | ICD-10-CM | POA: Diagnosis not present

## 2017-05-29 DIAGNOSIS — I5032 Chronic diastolic (congestive) heart failure: Secondary | ICD-10-CM | POA: Diagnosis not present

## 2017-05-29 MED ORDER — UMECLIDINIUM-VILANTEROL 62.5-25 MCG/INH IN AEPB: 1.0000 | INHALATION_SPRAY | Freq: Every day | RESPIRATORY_TRACT | 3 refills | Status: DC

## 2017-05-29 NOTE — Telephone Encounter (Signed)
Pt requesting a 90 day supply of anoro to be sent to pharmacy.  This has been sent.  Nothing further needed.

## 2017-05-30 DIAGNOSIS — I5032 Chronic diastolic (congestive) heart failure: Secondary | ICD-10-CM | POA: Diagnosis not present

## 2017-05-30 DIAGNOSIS — R0609 Other forms of dyspnea: Secondary | ICD-10-CM | POA: Diagnosis not present

## 2017-05-30 DIAGNOSIS — J449 Chronic obstructive pulmonary disease, unspecified: Secondary | ICD-10-CM | POA: Diagnosis not present

## 2017-06-05 ENCOUNTER — Telehealth: Payer: Self-pay | Admitting: Cardiovascular Disease

## 2017-06-05 MED ORDER — FUROSEMIDE 40 MG PO TABS: 40.0000 mg | ORAL_TABLET | ORAL | 0 refills | Status: DC

## 2017-06-05 MED ORDER — APIXABAN 5 MG PO TABS: 5.0000 mg | ORAL_TABLET | Freq: Two times a day (BID) | ORAL | 4 refills | Status: DC

## 2017-06-05 NOTE — Telephone Encounter (Signed)
New Message      *STAT* If patient is at the pharmacy, call can be transferred to refill team.   1. Which medications need to be refilled? (please list name of each medication and dose if known)  eliquis 5 mg  furosemide (LASIX) 40 MG tablet  2. Which pharmacy/location (including street and city if local pharmacy) is medication to be sent to?  [facility]   3. Do they need a 30 day or 90 day supply? [place]

## 2017-06-05 NOTE — Addendum Note (Signed)
Addended by: Harrington Challenger on: 06/05/2017 10:12 AM   Modules accepted: Orders

## 2017-06-06 ENCOUNTER — Other Ambulatory Visit: Payer: Self-pay | Admitting: Cardiovascular Disease

## 2017-06-06 MED ORDER — FUROSEMIDE 40 MG PO TABS: 40.0000 mg | ORAL_TABLET | ORAL | 0 refills | Status: DC

## 2017-06-06 MED ORDER — FUROSEMIDE 40 MG PO TABS: ORAL_TABLET | ORAL | 0 refills | Status: DC

## 2017-06-06 NOTE — Telephone Encounter (Signed)
Rx(s) sent to pharmacy electronically. Scheduled for ROV 6 month appt on 06/09/17

## 2017-06-06 NOTE — Telephone Encounter (Signed)
New Message       *STAT* If patient is at the pharmacy, call can be transferred to refill team.   1. Which medications need to be refilled? (please list name of each medication and dose if known) furosemide (LASIX) 40 MG tablet  2. Which pharmacy/location (including street and city if local pharmacy) is medication to be sent to? [facility]  3. Do they need a 30 day or 90 day supply? [place]

## 2017-06-09 ENCOUNTER — Encounter: Payer: Self-pay | Admitting: Cardiovascular Disease

## 2017-06-09 ENCOUNTER — Ambulatory Visit: Payer: PPO | Admitting: Cardiovascular Disease

## 2017-06-09 VITALS — BP 104/60 | HR 89 | Ht 67.0 in | Wt 204.0 lb

## 2017-06-09 DIAGNOSIS — E785 Hyperlipidemia, unspecified: Secondary | ICD-10-CM

## 2017-06-09 DIAGNOSIS — I5032 Chronic diastolic (congestive) heart failure: Secondary | ICD-10-CM | POA: Diagnosis not present

## 2017-06-09 DIAGNOSIS — I739 Peripheral vascular disease, unspecified: Secondary | ICD-10-CM

## 2017-06-09 DIAGNOSIS — I10 Essential (primary) hypertension: Secondary | ICD-10-CM | POA: Diagnosis not present

## 2017-06-09 DIAGNOSIS — Z720 Tobacco use: Secondary | ICD-10-CM

## 2017-06-09 DIAGNOSIS — I2510 Atherosclerotic heart disease of native coronary artery without angina pectoris: Secondary | ICD-10-CM

## 2017-06-09 DIAGNOSIS — I480 Paroxysmal atrial fibrillation: Secondary | ICD-10-CM

## 2017-06-09 DIAGNOSIS — I6523 Occlusion and stenosis of bilateral carotid arteries: Secondary | ICD-10-CM

## 2017-06-09 NOTE — Assessment & Plan Note (Signed)
History of paroxysmal atrial fibrillation probably persistent at this point. He is in A. fib today with a ventricular response of 89 on Eliquis oral anticoagulation.

## 2017-06-09 NOTE — Assessment & Plan Note (Signed)
History of essential hypertension blood pressure measured 104/60. He is on metoprolol and losartan. Continue current meds at current dosing.

## 2017-06-09 NOTE — Assessment & Plan Note (Signed)
History of PAD status post right subclavian and innominate stenting as well as right renal artery stenting. This renal Dopplers reveal stent to be widely patent. Carotid Dopplers reveal his innominate and subclavian to be occluded

## 2017-06-09 NOTE — Assessment & Plan Note (Signed)
Chronic diastolic heart failure on oral diuretic. He was hospitalized in June with pneumonia and diastolic heart failure. He does avoid salt.

## 2017-06-09 NOTE — Assessment & Plan Note (Signed)
History of CAD status post RCA bare-metal stenting December 2001 with a 3.5 mm x 12 mm long Medtronic S7 bare-metal stent. He did have a 5060% OM 2 branch stenosis and normal LV function. His last Myoview performed 11/28/2009 was negative. He denies chest pain.

## 2017-06-09 NOTE — Assessment & Plan Note (Signed)
History of dyslipidemia on atorvastatin followed by his PCP 

## 2017-06-09 NOTE — Assessment & Plan Note (Signed)
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 He had mild left ICA stenosis. He did have a blood pressure differential of 60 mmHg in his upper extremities right being lower than left with no flow in his right vertebral artery.

## 2017-06-09 NOTE — Progress Notes (Signed)
06/09/2017 Murat Rideout [place]   09/08/1940  [id number]  Primary Physician Ronita Hipps, MD Primary Cardiologist: Lorretta Harp MD Lupe Carney, [place]  HPI:  Kevin Watkins is a 76 y.o.  mildly overweight married Caucasian male father of 51, grandfather to 2 grandchildren who I last saw in the office  12/28/16.[name] He has a history of CAD and PVOD. I stented his right coronary artery as a 3.5 mm x 12 mm long Medtronic S7 bare metal stent in December of 2001. At that time he did have a 60% mid dominant RCA stenosis as well as a 50-60% OM2 branch stenosis with normal LV function. He had a negative Myoview Nov 28, 2009 and denies chest pain or shortness of breath. I stented his right subclavian and innominate vessel as well as his right renal artery. He has no known ostial left common carotid artery stenosis and left subclavian artery stenosis as well. He is cutting down his cigarettes from 1 pack per day to 1-2 packs per week and is using an Engineer, manufacturing. His other problem include hypertension and hyperlipidemia. We are following his various Doppler studies in our office.he saw Kerin Ransom PA-C in the office one week ago with complaints of dyspnea or lower extremity edema. Apparently he was advised to liberalize his salt intake. We advised him to avoid salt, and increased his furosemide as well as decreased his amlodipine. He lost 34 pounds. His edema has somewhat improved. We have continued to follow his basic metabolic panel and adjust his diuretics as necessary. He does weigh himself on a daily basis and adjust his diuretics by doubling them for 2-3 days if his weight begins to increase. We've been following his carotid and renal Doppler studies. 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 His right internal carotid artery stenosis has remained stable in the moderately severe range. He denies chest pain, shortness of breath or claudication. He  was seen by Kerin Ransom 06/23/16 and was in atrial fibrillation though he converted. He was begun on oral anticoagulation. He was also recently seen by Lajean Saver MD in the emergency room the following day with shortness of breath and edema. He does admit to dietary indiscretion with regard to salt. He was recently hospitalized 12/17/16 through the 16th expiratory failure, interstitial pneumonia and diastolic heart failure. He was briefly intubated from BiPAP, diuresed and placed on antibiotics and steroid therapy. He was discharged home on continuous O2 therapy which she currently wears only at night. Since I saw him 6 months ago he's remained stable. He has stopped smoking and feels clinically improved. He denies chest pain. He does avoid salt.     Current Meds  Medication Sig  . apixaban (ELIQUIS) 5 MG TABS tablet Take 1 tablet (5 mg total) by mouth 2 (two) times daily.  [name] aspirin EC 81 MG tablet Take 81 mg by mouth daily.  [name] atorvastatin (LIPITOR) 40 MG tablet Take 40 mg by mouth daily at 6 PM.   . folic acid (FOLVITE) 1 MG tablet Take 800 mcg by mouth daily.   . furosemide (LASIX) 40 MG tablet Take 1 tablet by mouth daily. May take extra 40 mg daily in afternoon for weight gain of 3lbs  . Garlic 8676 MG CAPS Take 1 capsule by mouth daily.   [name] guaiFENesin-dextromethorphan (ROBITUSSIN DM) 100-10 MG/5ML syrup Take 5 mLs by mouth every 4 (four) hours as  needed for cough.  [name] ipratropium-albuterol (DUONEB) 0.5-2.5 (3) MG/3ML SOLN Take 3 mLs by nebulization 4 (four) times daily as needed (shortness of breath).  . metoprolol tartrate (LOPRESSOR) 50 MG tablet Take 1/2 tablet twice a day  . nicotine (NICODERM CQ - DOSED IN MG/24 HOURS) 21 mg/24hr patch Place 1 patch (21 mg total) onto the skin daily.  . nitroGLYCERIN (NITROSTAT) 0.4 MG SL tablet Place 0.4 mg under the tongue every 5 (five) minutes as needed for chest pain.  . Omega-3 Fatty Acids (FISH OIL) 300 MG CAPS Take 2,400 mg by mouth daily.   .  Polyethylene Glycol 3350 (MIRALAX PO) Take 17 g by mouth daily.   . predniSONE (DELTASONE) 10 MG tablet Take 3 tablets (30 mg total) by mouth daily with breakfast. Take 3 tablets a day for next two days, then 2 tablets a day for two days, then 1 tablet for two days then stop.  [name] PROAIR HFA 108 (90 Base) MCG/ACT inhaler Inhale 2 puffs into the lungs every 6 (six) hours as needed for wheezing or shortness of breath.  . ranitidine (ZANTAC) 75 MG tablet Take 75 mg by mouth daily.  [name] thiamine 100 MG tablet Take 1 tablet (100 mg total) by mouth daily.  [name] umeclidinium-vilanterol (ANORO ELLIPTA) 62.5-25 MCG/INH AEPB Inhale 1 puff daily into the lungs.  . vitamin E (VITAMIN E) 400 UNIT capsule Take 400 Units by mouth daily.     No Known Allergies  Social History   Socioeconomic History  . Marital status: Married    Spouse name: Not on file  . Number of children: Not on file  . Years of education: Not on file  . Highest education level: Not on file  Social Needs  . Financial resource strain: Not on file  . Food insecurity - worry: Not on file  . Food insecurity - inability: Not on file  . Transportation needs - medical: Not on file  . Transportation needs - non-medical: Not on file  Occupational History  . Not on file  Tobacco Use  . Smoking status: Former Smoker    Packs/day: 1.00    Types: Cigarettes    Last attempt to quit: 12/17/2016    Years since quitting: 0.4  . Smokeless tobacco: Never Used  . Tobacco comment: also electronic cigarettes  Substance and Sexual Activity  . Alcohol use: Yes    Alcohol/week: 12.6 oz    Types: 21 Cans of beer per week    Comment: beer  . Drug use: No  . Sexual activity: Not on file  Other Topics Concern  . Not on file  Social History Narrative  . Not on file     Review of Systems: General: negative for chills, fever, night sweats or weight changes.  Cardiovascular: negative for chest pain, dyspnea on exertion, edema, orthopnea, palpitations,  paroxysmal nocturnal dyspnea or shortness of breath Dermatological: negative for rash Respiratory: negative for cough or wheezing Urologic: negative for hematuria Abdominal: negative for nausea, vomiting, diarrhea, bright red blood per rectum, melena, or hematemesis Neurologic: negative for visual changes, syncope, or dizziness All other systems reviewed and are otherwise negative except as noted above.    Blood pressure 104/60, pulse 89, height 5\' 7"  (1.702 m), weight 204 lb (92.5 kg).  General appearance: alert and no distress Neck: no adenopathy, no JVD, supple, symmetrical, trachea midline, thyroid not enlarged, symmetric, no tenderness/mass/nodules and Bilateral carotid bruits Lungs: clear to auscultation bilaterally Heart: irregularly irregular rhythm Extremities: extremities normal,  atraumatic, no cyanosis or edema Pulses: 2+ and symmetric Skin: Skin color, texture, turgor normal. No rashes or lesions Neurologic: Alert and oriented X 3, normal strength and tone. Normal symmetric reflexes. Normal coordination and gait  EKG atrial fibrillation with a ventricular response of 89 and nonspecific ST and T-wave changes. I personally reviewed this EKG.  ASSESSMENT AND PLAN:   CAD-RCA BMS 2001, low risk Myoview 2011 History of CAD status post RCA bare-metal stenting December 2001 with a 3.5 mm x 12 mm long Medtronic S7 bare-metal stent. He did have a 5060% OM 2 branch stenosis and normal LV function. His last Myoview performed 11/28/2009 was negative. He denies chest pain.  PAD- multiple interventions- renal, iliac, SCA History of PAD status post right subclavian and innominate stenting as well as right renal artery stenting. This renal Dopplers reveal stent to be widely patent. Carotid Dopplers reveal his innominate and subclavian to be occluded  Tobacco abuse Discontinue tobacco abuse 2 months ago.  Dyslipidemia, goal LDL below 70 History of dyslipidemia on atorvastatin followed by  his PCP  Chronic diastolic heart failure (HCC) Chronic diastolic heart failure on oral diuretic. He was hospitalized in June with pneumonia and diastolic heart failure. He does avoid salt.  Essential hypertension History of essential hypertension blood pressure measured 104/60. He is on metoprolol and losartan. Continue current meds at current dosing.  Bilateral carotid artery disease (HCC) History of carotid artery disease with Dopplers performed 11/14/16 revealing moderately severe right ICA stenosis with occluded right innominate and subclavian artery. He had mild left ICA stenosis. He did have a blood pressure differential of 60 mmHg in his upper extremities right being lower than left with no flow in his right vertebral artery.  Paroxysmal atrial fibrillation (HCC) History of paroxysmal atrial fibrillation probably persistent at this point. He is in A. fib today with a ventricular response of 89 on Eliquis oral anticoagulation.      Lorretta Harp MD FACP,FACC,FAHA, [facility] 06/09/2017 11:07 AM

## 2017-06-09 NOTE — Assessment & Plan Note (Signed)
Discontinue tobacco abuse 2 months ago 

## 2017-06-09 NOTE — Patient Instructions (Signed)
Medication Instructions: Your physician recommends that you continue on your current medications as directed. Please refer to the Current Medication list given to you today.  Testing/Procedures: Your physician has requested that you have a carotid duplex. This test is an ultrasound of the carotid arteries in your neck. It looks at blood flow through these arteries that supply the brain with blood. Allow one hour for this exam. There are no restrictions or special instructions. (Due May 2019)   Follow-Up: We request that you follow-up in: 6 months with an extender and in 12 months with Dr Andria Rhein will receive a reminder letter in the mail two months in advance. If you don't receive a letter, please call our office to schedule the follow-up appointment.  If you need a refill on your cardiac medications before your next appointment, please call your pharmacy.

## 2017-06-21 ENCOUNTER — Other Ambulatory Visit: Payer: Self-pay | Admitting: Internal Medicine

## 2017-06-28 DIAGNOSIS — R0609 Other forms of dyspnea: Secondary | ICD-10-CM | POA: Diagnosis not present

## 2017-06-28 DIAGNOSIS — I5032 Chronic diastolic (congestive) heart failure: Secondary | ICD-10-CM | POA: Diagnosis not present

## 2017-06-28 DIAGNOSIS — J449 Chronic obstructive pulmonary disease, unspecified: Secondary | ICD-10-CM | POA: Diagnosis not present

## 2017-06-29 DIAGNOSIS — I5032 Chronic diastolic (congestive) heart failure: Secondary | ICD-10-CM | POA: Diagnosis not present

## 2017-06-29 DIAGNOSIS — R0609 Other forms of dyspnea: Secondary | ICD-10-CM | POA: Diagnosis not present

## 2017-06-29 DIAGNOSIS — J449 Chronic obstructive pulmonary disease, unspecified: Secondary | ICD-10-CM | POA: Diagnosis not present

## 2017-07-27 DIAGNOSIS — Z6833 Body mass index (BMI) 33.0-33.9, adult: Secondary | ICD-10-CM | POA: Diagnosis not present

## 2017-07-27 DIAGNOSIS — J189 Pneumonia, unspecified organism: Secondary | ICD-10-CM | POA: Diagnosis not present

## 2017-07-28 ENCOUNTER — Telehealth: Payer: Self-pay | Admitting: Emergency Medicine

## 2017-07-28 NOTE — Telephone Encounter (Signed)
Spoke with pt. He uses oxygen at night time and is having to use it from time to time during the day. Pt would like have an order for a POC. Advised pt that he would have to qualified for this and also have an appointment. He has been scheduled to see RB on 08/01/17 at 2pm. Nothing further was needed.

## 2017-07-29 DIAGNOSIS — R0609 Other forms of dyspnea: Secondary | ICD-10-CM | POA: Diagnosis not present

## 2017-07-29 DIAGNOSIS — I5032 Chronic diastolic (congestive) heart failure: Secondary | ICD-10-CM | POA: Diagnosis not present

## 2017-07-29 DIAGNOSIS — J449 Chronic obstructive pulmonary disease, unspecified: Secondary | ICD-10-CM | POA: Diagnosis not present

## 2017-07-30 DIAGNOSIS — I5032 Chronic diastolic (congestive) heart failure: Secondary | ICD-10-CM | POA: Diagnosis not present

## 2017-07-30 DIAGNOSIS — R0609 Other forms of dyspnea: Secondary | ICD-10-CM | POA: Diagnosis not present

## 2017-07-30 DIAGNOSIS — J449 Chronic obstructive pulmonary disease, unspecified: Secondary | ICD-10-CM | POA: Diagnosis not present

## 2017-08-01 ENCOUNTER — Ambulatory Visit (INDEPENDENT_AMBULATORY_CARE_PROVIDER_SITE_OTHER)
Admission: RE | Admit: 2017-08-01 | Discharge: 2017-08-01 | Disposition: A | Discharge status: Home / Self Care | Payer: PPO | Source: Ambulatory Visit | Attending: Emergency Medicine | Admitting: Emergency Medicine

## 2017-08-01 ENCOUNTER — Ambulatory Visit: Payer: PPO | Admitting: Emergency Medicine

## 2017-08-01 ENCOUNTER — Encounter: Payer: Self-pay | Admitting: Emergency Medicine

## 2017-08-01 DIAGNOSIS — J9601 Acute respiratory failure with hypoxia: Secondary | ICD-10-CM | POA: Diagnosis not present

## 2017-08-01 DIAGNOSIS — J189 Pneumonia, unspecified organism: Secondary | ICD-10-CM

## 2017-08-01 DIAGNOSIS — J449 Chronic obstructive pulmonary disease, unspecified: Secondary | ICD-10-CM | POA: Diagnosis not present

## 2017-08-01 DIAGNOSIS — R05 Cough: Secondary | ICD-10-CM | POA: Diagnosis not present

## 2017-08-01 NOTE — Patient Instructions (Addendum)
Please finish your levaquin and prednisone as ordered by Dr Helene Kelp until completely gone.  Continue your Anoro once a day Keep albuterol available to use 2 puffs up to every 4 hours if needed for shortness of breath, wheeze, chest tightness.  Start using oxygen at 2L/min with all exertion. Continue to use at night while sleeping as you have been.  CXR today to evaluate for pneumonia Please call our office next week if your mucus continues to have blood Follow with Dr Lamonte Sakai in 3 months or sooner if you have any problems.

## 2017-08-01 NOTE — Assessment & Plan Note (Signed)
Had been for the most part stable but now being treated for community acquired pneumonia beginning at the end of last week.  Plan to complete therapy, assess his status on Anoro and albuterol.  Desaturation on walking oximetry today.  I instructed him to use 2 L/min with exertion.

## 2017-08-01 NOTE — Assessment & Plan Note (Signed)
Started on levofloxacin and prednisone by his primary care physician several days ago.  I will perform a chest x-ray to evaluate for any new infiltrates.  Call me if not improving by next week

## 2017-08-01 NOTE — Progress Notes (Signed)
@Patient  ID: Kevin Watkins, male    DOB: [birth year], 77 y.o.   MRN: [id number]  Chief Complaint  Patient presents with  . COPD    Qualify for POC, hemoptysis     Referring provider: Ronita Hipps, MD  HPI: 77 year old male smoker seen for pulmonary critical care consult during hospitalization June 2018 with acute hypoxic and hypercarbic respiratory failure with suspected COPD exacerbation  TEST  Echo 05/2016 nml LVEF  Hospital f/u  Patient returns for post hospital follow-up. Patient was admitted last month for an acute hypoxic and hypercarbic respiratory failure with suspected COPD exacerbation. Patient was an active smoker and felt to have undiagnosed COPD on admission. Patient presented with progressive shortness of breath. Chest x-ray showed bilateral diffuse infiltrates felt to be representative of possible fluid overload. He was treated initially with BiPAP. However, patient continued to have decompensation and required intubation. ABG reflected hypercarbic respiratory failure. Patient was treated with aggressive diuresis, empiric antibiotics and steroids. Patient was extubated successfully. Patient is on oxygen at home at 1 L at bedtime. He did continue to desaturate on room air. And was started on oxygen at 2 L at discharge. During admission. He was diuresed 5 L negative. Discharge weight was at 189 pounds. Wt avg at home , weighs daily at 189-191lbs. Takes lasix 40mg  daily , takes extra lasix if wt trends up.  Since discharge. Patient is feeling Chest x-ray today shows BB fibrosis w/ mild interstitial edema.  Quit smoking since discharge.  Wearing O2 at 2l/m At bedtime  . Using with actiity As needed  . Sats have been improved in mid 90s on room air.  Smoked 54yr 1/2-2 PPD .  Did telephone work .  Spirometry today shows an FEV1 at 32%, ratio 30, FVC 77%. Flow volume loops were poor.  ROV 03/23/17 -- Patient has a history of former tobacco use (recently quit), COPD, hypoxemic  respiratory failure. He underwent pulmonary function testing today that I have reviewed. This shows severe obstruction with an FEV1 1.44 L (53% predicted), no bronchodilator response, hyperinflated lung volumes, decreased diffusion capacity that corrects to the normal range when adjusted for alveolar volume. He is currently managed on Anoro, believes that it helps him. He uses DuoNeb rarely, uses proAir about once a week.  He is using oxygen at night. sometimes w exertion.   ROV 08/01/17 --patient with a history of former tobacco, severe obstruction on pulmonary function testing, hypoxemic respiratory failure, on oxygen at night.  Walking oximetry last visit 03/2017 did not show desaturation on room air.  Currently using Anoro, albuterol which he uses approximately 3-4x a day.  Repeat ambulatory oximetry today showed that he did desaturate.  We titrated him to 2 L/min.  He was started on pred and levaquin last week for increased cough, hemoptysis, fever beginning 5 days ago at [facility]. CXR wasn't done.    No Known Allergies  Immunization History  Administered Date(s) Administered  . Influenza-Unspecified 04/01/2014, 04/07/2015, 03/30/2016, 04/10/2017  . Pneumococcal-Unspecified 06/21/2010, 01/27/2014  . Td 03/01/2012  . Zoster 03/05/2012    Past Medical History:  Diagnosis Date  . CAD in native artery 06/2000   BMS to the RCA; known 60% mid dominant RCA stenosis and 50-60% OM 2 branch stenosis normal LV function. Last Myoview May 2011 negative for ischemia.  . CHF (congestive heart failure) ([place])   . COPD (chronic obstructive pulmonary disease) ([place])   . Dyslipidemia, goal LDL below 70   . Edema   .  EtOH dependence ([place])    Family reported  . Hypertension   . Oxygen deficiency   . PAD (peripheral artery disease) (HCC)    iliac, SCA, Innominate, and renal PTA  . Tobacco abuse     Tobacco History: Social History   Tobacco Use  Smoking Status Former Smoker  . Packs/day: 1.00  .  Types: Cigarettes  . Last attempt to quit: 12/17/2016  . Years since quitting: 0.6  Smokeless Tobacco Never Used  Tobacco Comment   also electronic cigarettes   Counseling given: Not Answered Comment: also electronic cigarettes   Outpatient Encounter Medications as of 08/01/2017  Medication Sig  . apixaban (ELIQUIS) 5 MG TABS tablet Take 1 tablet (5 mg total) by mouth 2 (two) times daily.  [name] aspirin EC 81 MG tablet Take 81 mg by mouth daily.  [name] atorvastatin (LIPITOR) 40 MG tablet Take 40 mg by mouth daily at 6 PM.   . folic acid (FOLVITE) 1 MG tablet Take 800 mcg by mouth daily.   . furosemide (LASIX) 40 MG tablet Take 1 tablet by mouth daily. May take extra 40 mg daily in afternoon for weight gain of 3lbs  . Garlic 6578 MG CAPS Take 1 capsule by mouth daily.   [name] guaiFENesin-dextromethorphan (ROBITUSSIN DM) 100-10 MG/5ML syrup Take 5 mLs by mouth every 4 (four) hours as needed for cough.  [name] ipratropium-albuterol (DUONEB) 0.5-2.5 (3) MG/3ML SOLN Take 3 mLs by nebulization 4 (four) times daily as needed (shortness of breath).  [name] levofloxacin (LEVAQUIN) 500 MG tablet Take 500 mg by mouth daily.  . Melatonin 5 MG CAPS Take 10 mg by mouth at bedtime.  . metoprolol tartrate (LOPRESSOR) 50 MG tablet Take 1/2 tablet twice a day  . Omega-3 Fatty Acids (FISH OIL) 300 MG CAPS Take 2,400 mg by mouth daily.   . Polyethylene Glycol 3350 (MIRALAX PO) Take 17 g by mouth daily.   . predniSONE (DELTASONE) 20 MG tablet Take 20 mg by mouth daily with breakfast.  . PROAIR HFA 108 (90 Base) MCG/ACT inhaler Inhale 2 puffs into the lungs every 6 (six) hours as needed for wheezing or shortness of breath.  . ranitidine (ZANTAC) 75 MG tablet Take 75 mg by mouth daily.  [name] thiamine 100 MG tablet Take 1 tablet (100 mg total) by mouth daily.  [name] umeclidinium-vilanterol (ANORO ELLIPTA) 62.5-25 MCG/INH AEPB Inhale 1 puff daily into the lungs.  . vitamin E (VITAMIN E) 400 UNIT capsule Take 400 Units by mouth daily.  [name]  losartan (COZAAR) 25 MG tablet Take 1 tablet (25 mg total) by mouth daily.  . nitroGLYCERIN (NITROSTAT) 0.4 MG SL tablet Place 0.4 mg under the tongue every 5 (five) minutes as needed for chest pain.  . [DISCONTINUED] nicotine (NICODERM CQ - DOSED IN MG/24 HOURS) 21 mg/24hr patch Place 1 patch (21 mg total) onto the skin daily. (Patient not taking: Reported on 08/01/2017)  . [DISCONTINUED] predniSONE (DELTASONE) 10 MG tablet Take 3 tablets (30 mg total) by mouth daily with breakfast. Take 3 tablets a day for next two days, then 2 tablets a day for two days, then 1 tablet for two days then stop. (Patient not taking: Reported on 08/01/2017)   No facility-administered encounter medications on file as of 08/01/2017.      Review of Systems As per HPI   Physical Exam  BP 110/68 (BP Location: Left Arm, Cuff Size: Normal)   Pulse (!) 108   Ht 5\' 7"  (1.702 m)   Wt 213  lb (96.6 kg)   SpO2 97%   BMI 33.36 kg/m   Gen: Pleasant, well-nourished, in no distress,  normal affect  ENT: No lesions,  mouth clear,  oropharynx clear, no postnasal drip  Neck: No JVD, no stridor  Lungs: No use of accessory muscles, very distant, no wheezing, no crackles  Cardiovascular: RRR, heart sounds normal, no murmur or gallops, no peripheral edema  Musculoskeletal: No deformities, no cyanosis or clubbing  Neuro: alert, non focal  Skin: Warm, no lesions or rash       Assessment & Plan:   COPD (chronic obstructive pulmonary disease) ([place]) Had been for the most part stable but now being treated for community acquired pneumonia beginning at the end of last week.  Plan to complete therapy, assess his status on Anoro and albuterol.  Desaturation on walking oximetry today.  I instructed him to use 2 L/min with exertion.  Acute respiratory failure with hypoxia (HCC) Documented desaturation.  2 L/min adequate to treat him during walking.  CAP (community acquired pneumonia) Started on levofloxacin and prednisone  by his primary care physician several days ago.  I will perform a chest x-ray to evaluate for any new infiltrates.  Call me if not improving by next week  Baltazar Apo, MD, PhD 08/01/2017, 2:32 PM [facility] [phone] or if no answer [phone]

## 2017-08-01 NOTE — Assessment & Plan Note (Signed)
Documented desaturation.  2 L/min adequate to treat him during walking.

## 2017-08-01 NOTE — Addendum Note (Signed)
Addended by: Desmond Dike C on: 08/01/2017 02:34 PM   Modules accepted: Orders

## 2017-08-04 ENCOUNTER — Telehealth: Payer: Self-pay | Admitting: Emergency Medicine

## 2017-08-04 DIAGNOSIS — J9601 Acute respiratory failure with hypoxia: Secondary | ICD-10-CM

## 2017-08-04 NOTE — Telephone Encounter (Signed)
Called and spoke with pt who stated [facility] had not received an order for him to be able to have a POC.  Stated to pt that I was going to place another order to [facility].  Pt expressed understanding.  Order placed to [facility].  Nothing further needed at this current time.

## 2017-08-09 ENCOUNTER — Telehealth: Payer: Self-pay | Admitting: Emergency Medicine

## 2017-08-09 DIAGNOSIS — J449 Chronic obstructive pulmonary disease, unspecified: Secondary | ICD-10-CM

## 2017-08-09 NOTE — Telephone Encounter (Signed)
Called pt letting him know we could place the order to [facility] for him to receive a simply go mini but stated to him that per [facility], they are allotted only so many of the simply go minis each month that pt will probably be placed on a wait list in order for him to receive one.  Pt expressed understanding. Order placed for pt to receive a simply go mini. Nothing further needed at this current time.

## 2017-08-11 ENCOUNTER — Telehealth: Payer: Self-pay | Admitting: Emergency Medicine

## 2017-08-11 NOTE — Telephone Encounter (Signed)
Spoke with [employer]. He stated that the order they received did not stay whether or not he was placed on 2L continuous or pulse.   Ria Comment, do you remember if this patient was walked with continuous or pulse.

## 2017-08-11 NOTE — Telephone Encounter (Signed)
Referral Notes  Number of Notes: 2  Type Date User Summary Attachment  General 08/11/2017 11:25 AM Ilona Sorrel - -  Note   Message sent to Melissa/Jason at [facility].        Type Date User Summary Attachment  Provider Comments 08/09/2017 2:40 PM Pinion, Waldemar Dickens, CMA Provider Comments -  Note   Please provide patient with a simply go mini  DME: [facility]         Spoke with pt, he was advised him that the order was sent today and nothing further is needed.

## 2017-08-11 NOTE — Telephone Encounter (Signed)
Per [name], the pt has not been tested to use the Simply Go Mini. If he has not been tested for this machine he will need to come in to do this. Cb is [phone] ext [phone].

## 2017-08-11 NOTE — Telephone Encounter (Signed)
Spoke to Kevin Watkins.  Pt was never brought in to the office to be qualified for the simply go mini.  This will need to be done.

## 2017-08-14 NOTE — Telephone Encounter (Signed)
Kevin Watkins - do remember if the pt was walked with pulse or continuous oxygen?

## 2017-08-14 NOTE — Telephone Encounter (Signed)
I was not the one who walked the pt. It looks like Burman Nieves was the one who did this test.

## 2017-08-16 NOTE — Telephone Encounter (Signed)
[name], [facility], calling for update.  CB is [phone].

## 2017-08-16 NOTE — Telephone Encounter (Signed)
Called and spoke with patient, patient is scheduled to come in 2.7.19 for qualifying walk for simply go mini.  RB patient is also requesting suggestions on over the counter sleep medications. He states that what he has right now is only working for sleeping at most 2 hours.

## 2017-08-17 ENCOUNTER — Ambulatory Visit (INDEPENDENT_AMBULATORY_CARE_PROVIDER_SITE_OTHER): Payer: PPO | Admitting: *Deleted

## 2017-08-17 ENCOUNTER — Telehealth: Payer: Self-pay | Admitting: Emergency Medicine

## 2017-08-17 DIAGNOSIS — J9611 Chronic respiratory failure with hypoxia: Secondary | ICD-10-CM

## 2017-08-17 DIAGNOSIS — J449 Chronic obstructive pulmonary disease, unspecified: Secondary | ICD-10-CM

## 2017-08-17 NOTE — Telephone Encounter (Signed)
Done

## 2017-08-17 NOTE — Telephone Encounter (Signed)
Spoke with patient, advised him of RB response. Nothing further needed.

## 2017-08-17 NOTE — Telephone Encounter (Signed)
Ambulatory Pulse Oximetry   Row Name  08/17/17 1207             Resting     Supplemental oxygen during test?  No               CP             Resting Heart Rate  92               CP             Resting Sp02  94  room air               CP                            Lap 1 (185 feet)     HR  94               CP             02 Sat  88  room air               CP                            Lap 2 (185 feet)     HR  88               CP             02 Sat  96  2L of O2               CP                            Lap 3 (185 feet)     HR  90               CP             02 Sat  98  2L O2.                CP             Tech Comments:  Patient was able to comple- te all 3 laps. He did not have to stop sever- al times to his legs feeling tired.                CP              Patient came into the office to qualify for the Simply Go Mini. Above are the sats from his walk.   RB, please advise if you are ok with [place] placing this order for him.   He also wanted to know if he could take anything else OTC to help him sleep. He is currently taking 5mg  of melatonin every night but it will only allow him to sleep for 2 hours before he is awake again.

## 2017-08-17 NOTE — Telephone Encounter (Signed)
He could try taking benadryl 25mg  at bedtime, see if this helps him.

## 2017-08-17 NOTE — Telephone Encounter (Signed)
RB please advise on patients results, is it ok to place order

## 2017-08-18 ENCOUNTER — Telehealth: Payer: Self-pay | Admitting: Emergency Medicine

## 2017-08-18 DIAGNOSIS — J9611 Chronic respiratory failure with hypoxia: Secondary | ICD-10-CM

## 2017-08-18 DIAGNOSIS — J449 Chronic obstructive pulmonary disease, unspecified: Secondary | ICD-10-CM

## 2017-08-18 NOTE — Telephone Encounter (Signed)
Order has been placed.

## 2017-08-18 NOTE — Telephone Encounter (Signed)
Spoke with pt. States that he was here a few days and was walked on a Simply Go Mini POC. Per Cherina's documentation, the pt qualified for the POC. An order was not placed for this. I advised pt that I would take care of this for him but [facility] may not be able to get him one of these due to [facility] having [place] a 5 POC limit per month. He verbalized understanding. Order has been placed. Nothing further was needed.

## 2017-08-21 ENCOUNTER — Telehealth: Payer: Self-pay | Admitting: Emergency Medicine

## 2017-08-21 NOTE — Telephone Encounter (Signed)
RB please advise when order has been signed.  Thanks!

## 2017-08-21 NOTE — Telephone Encounter (Signed)
Dr Lamonte Sakai has not signed the order yet.  We can't send the order to them until it has been signed.  I sent a staff message to Dr Rollene Rotunda on Friday to make them aware order has to be signed.

## 2017-08-21 NOTE — Telephone Encounter (Signed)
Spoke with pt, states that [facility] is telling him that they haven't received Simply Go Mini order. Per chart this was just ordered on Friday.  I advised pt that these orders take time to process, and that we will look into this.  PCC's please advise on status of order.  Thanks.

## 2017-08-21 NOTE — Telephone Encounter (Signed)
Yes ok to order at 2L/min w exertion

## 2017-08-21 NOTE — Addendum Note (Signed)
Addended by: Lorretta Harp on: 08/21/2017 02:55 PM   Modules accepted: Orders

## 2017-08-21 NOTE — Telephone Encounter (Signed)
Order placed for pt to receive O2 2L with exertion. Nothing further needed at this current time.

## 2017-08-22 NOTE — Telephone Encounter (Signed)
Order has been signed by Dr Lamonte Sakai & I have sent it to [facility].

## 2017-08-22 NOTE — Telephone Encounter (Signed)
[name] from [facility] calling to see if the order from 08/18/2017 for the Simply Go Mini has been signed. Cb is [phone] ext [phone].

## 2017-08-22 NOTE — Telephone Encounter (Signed)
RB has been contacted via text message to have him sign this order. This is all I can do at this time.

## 2017-08-27 IMAGING — DX DG CHEST 1V PORT
1 series · 1 of 1 positions shown · non-contrast
Comparison: Frontal and lateral views 06/24/2016

CLINICAL DATA: Awoke with shortness of breath.

EXAM:
PORTABLE CHEST 1 VIEW

[chest ap]
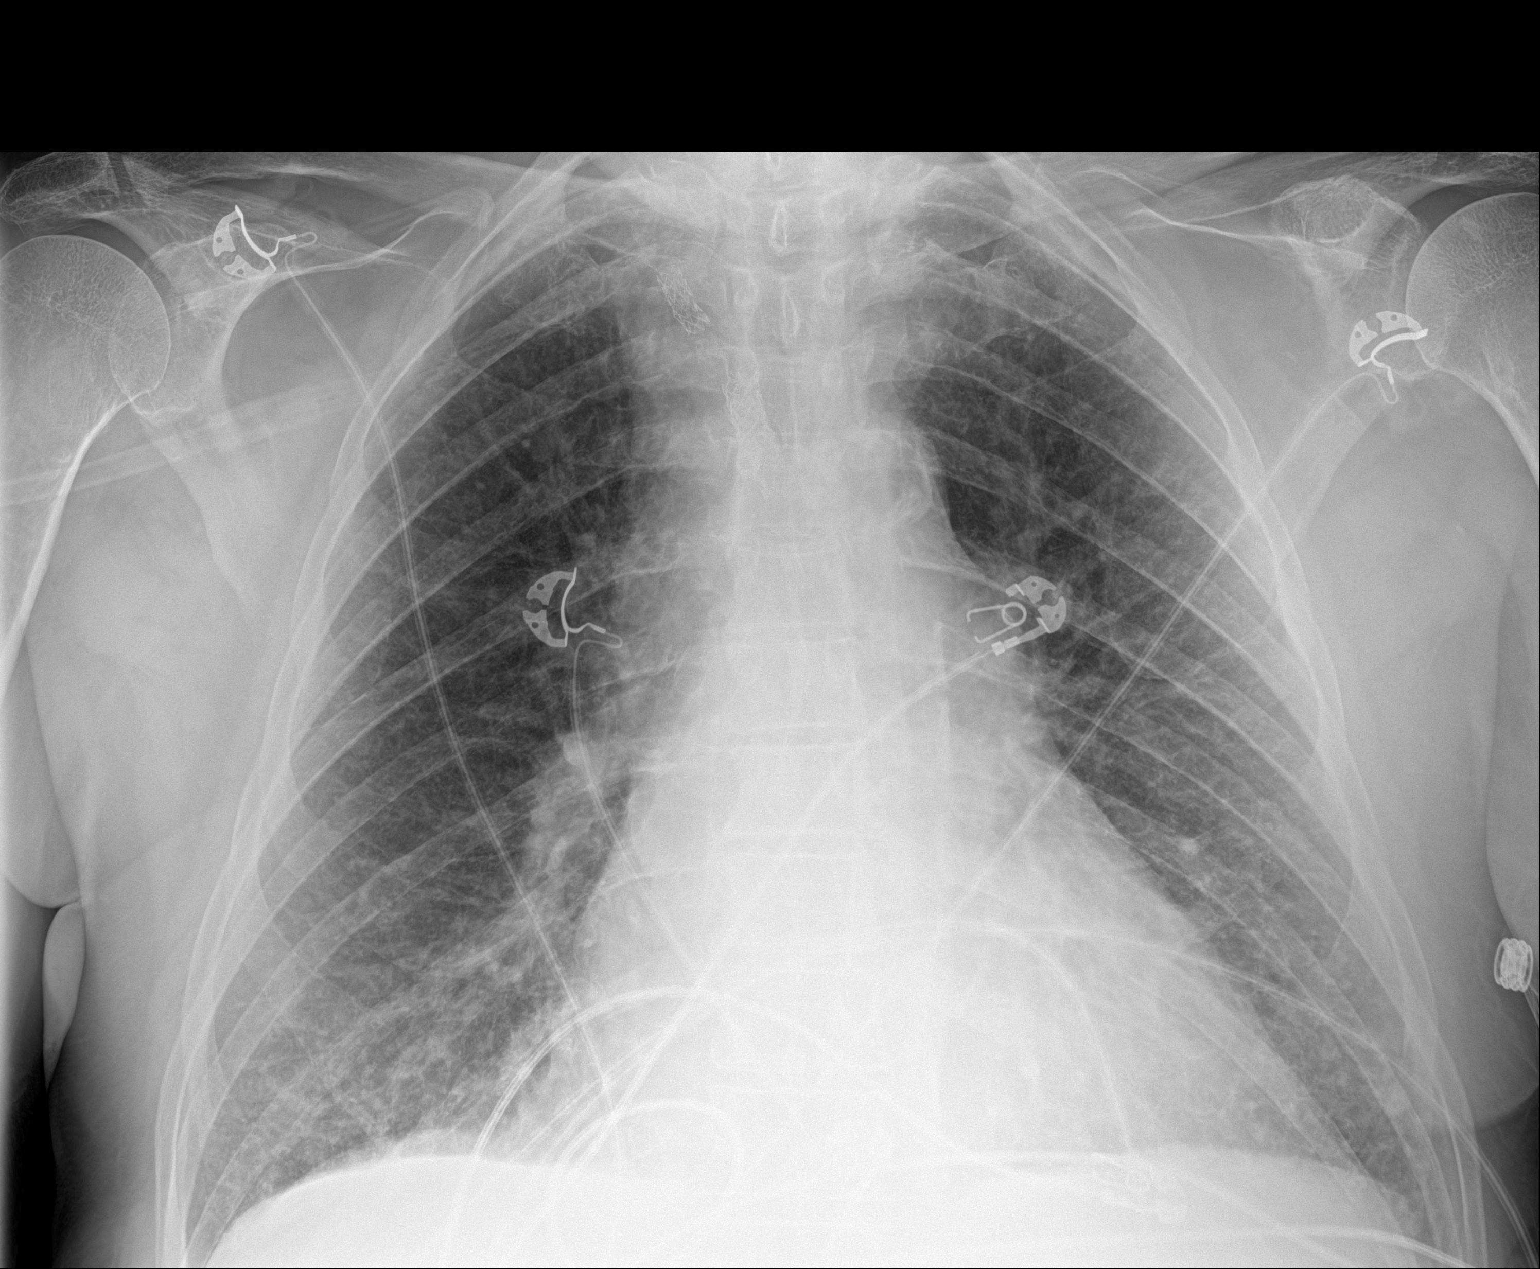

[1 of 1 positions shown; findings below may reference images not displayed]

FINDINGS: Stable cardiomegaly mediastinal contours. Atherosclerosis of the
thoracic aorta. Vascular stents in the region of the right
mediastinum. Decreased pleural effusions from prior exam. Decreased
pulmonary edema. Residual interstitial thickening at the lung bases.
No focal airspace disease. No pneumothorax. Osseous structures are
grossly stable.
IMPRESSION: 1. Mild pulmonary edema at the lung bases, improved from prior exam.
Previous pleural effusions have resolved.
2. Stable cardiomegaly and thoracic aortic atherosclerosis.

## 2017-08-29 DIAGNOSIS — J449 Chronic obstructive pulmonary disease, unspecified: Secondary | ICD-10-CM | POA: Diagnosis not present

## 2017-08-29 DIAGNOSIS — I5032 Chronic diastolic (congestive) heart failure: Secondary | ICD-10-CM | POA: Diagnosis not present

## 2017-08-29 DIAGNOSIS — R0609 Other forms of dyspnea: Secondary | ICD-10-CM | POA: Diagnosis not present

## 2017-08-30 DIAGNOSIS — J449 Chronic obstructive pulmonary disease, unspecified: Secondary | ICD-10-CM | POA: Diagnosis not present

## 2017-08-30 DIAGNOSIS — I5032 Chronic diastolic (congestive) heart failure: Secondary | ICD-10-CM | POA: Diagnosis not present

## 2017-08-30 DIAGNOSIS — R0609 Other forms of dyspnea: Secondary | ICD-10-CM | POA: Diagnosis not present

## 2017-09-27 DIAGNOSIS — J449 Chronic obstructive pulmonary disease, unspecified: Secondary | ICD-10-CM | POA: Diagnosis not present

## 2017-09-27 DIAGNOSIS — R0609 Other forms of dyspnea: Secondary | ICD-10-CM | POA: Diagnosis not present

## 2017-09-27 DIAGNOSIS — I5032 Chronic diastolic (congestive) heart failure: Secondary | ICD-10-CM | POA: Diagnosis not present

## 2017-09-27 IMAGING — DX DG CHEST 2V
2 series · 2 of 2 positions shown · non-contrast
Comparison: December 23, 2016 and June 24, 2016

CLINICAL DATA: Respiratory failure

EXAM:
CHEST  2 VIEW

[chest pa]
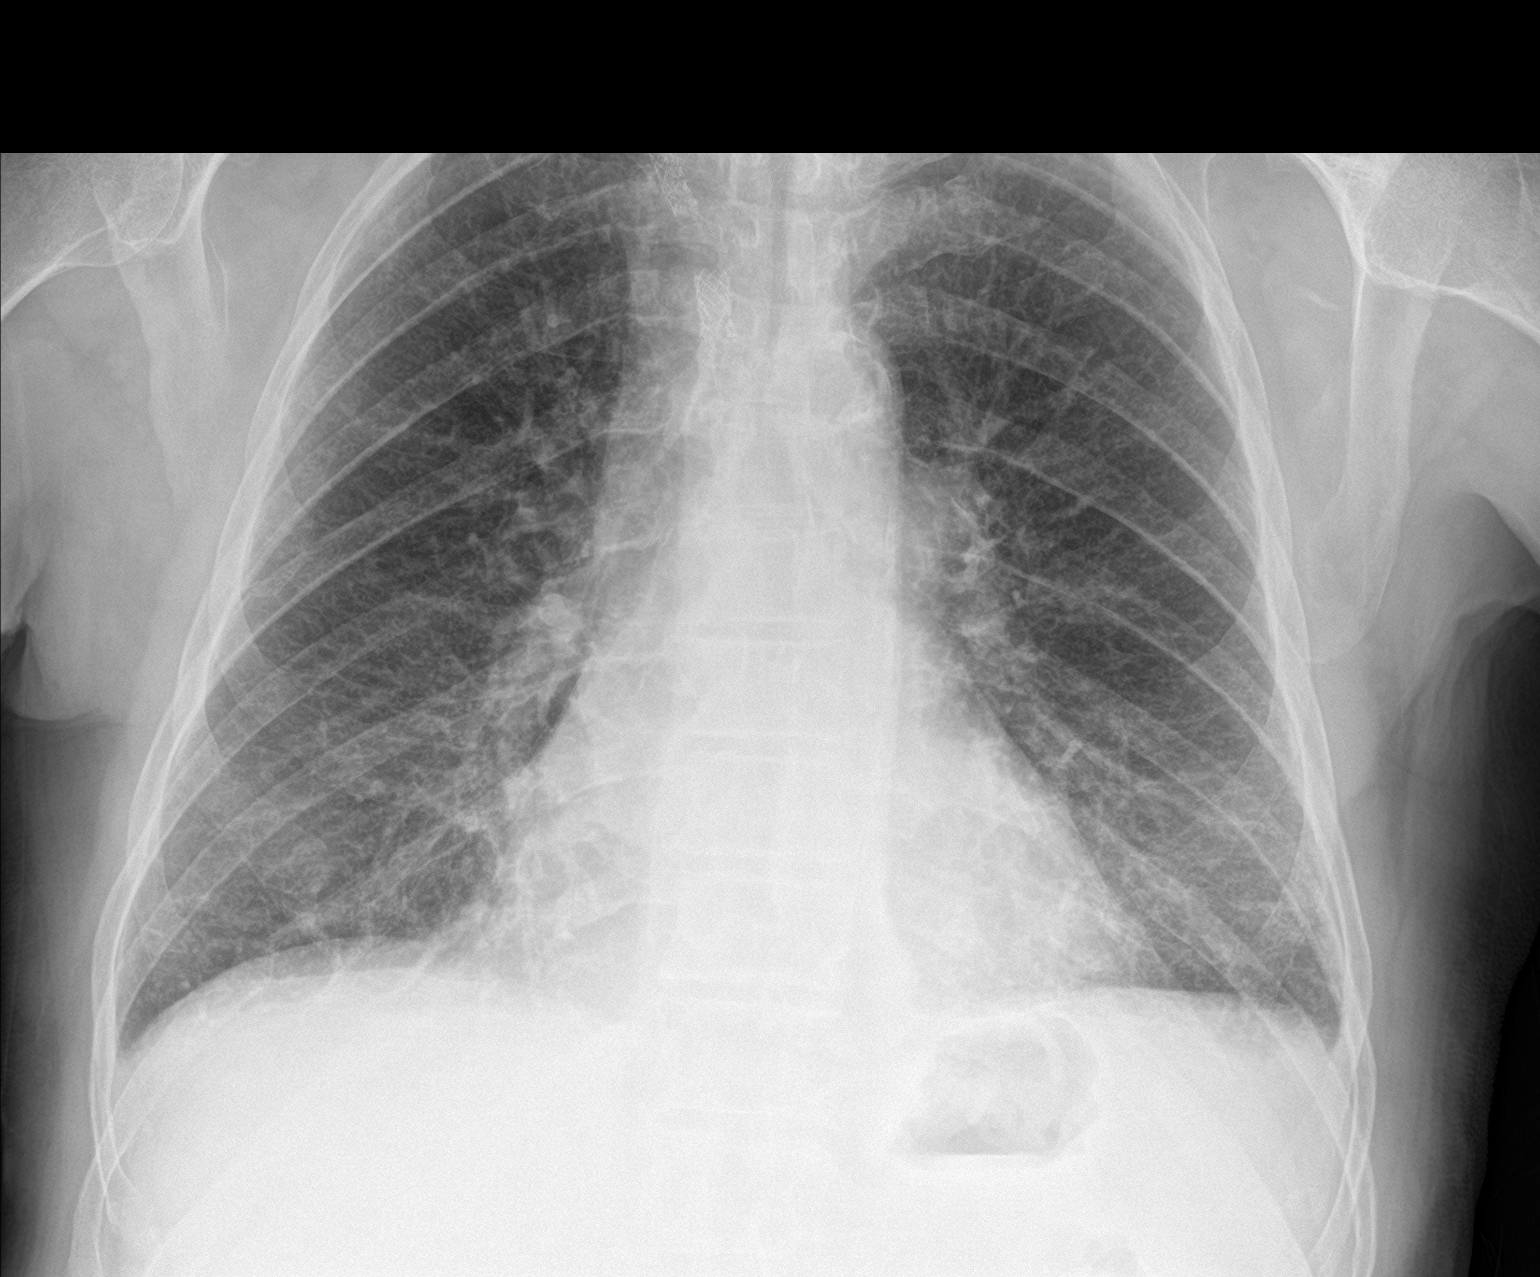

[chest lat]
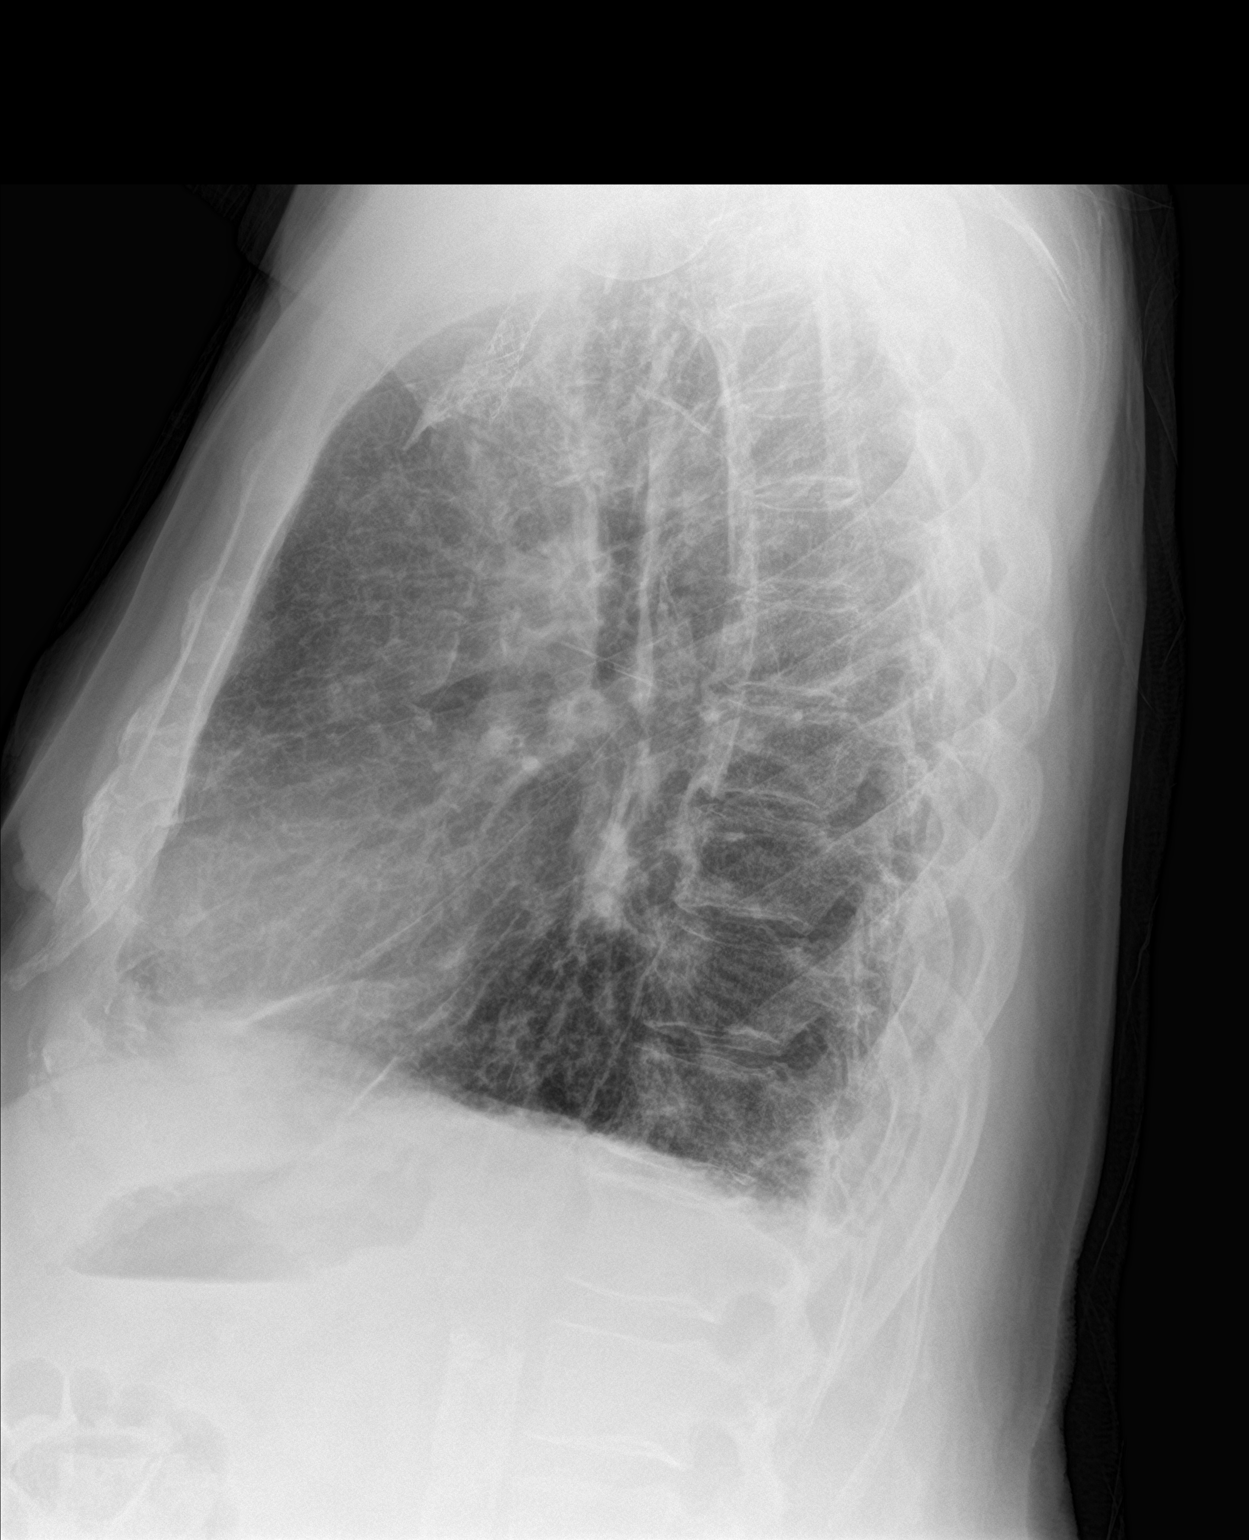

[2 of 2 positions shown; findings below may reference images not displayed]

FINDINGS: There is fibrotic type change in the lower lung zones. There is
questionable slight interstitial edema superimposed in the bases.
The lungs elsewhere are clear. No airspace consolidation. Heart is
upper normal in size with pulmonary vascularity within normal
limits. There is aortic atherosclerosis. No adenopathy. No bone
lesions. There is a right subclavian artery region stent.
IMPRESSION: Bibasilar fibrosis. There may be mild chronic interstitial edema
superimposed. No airspace consolidation. Heart upper normal in size.
There is aortic atherosclerosis. Right subclavian stent present.

Aortic Atherosclerosis ([id number]-G4X.X).

## 2017-10-05 DIAGNOSIS — J40 Bronchitis, not specified as acute or chronic: Secondary | ICD-10-CM | POA: Diagnosis not present

## 2017-10-05 DIAGNOSIS — Z86010 Personal history of colonic polyps: Secondary | ICD-10-CM | POA: Diagnosis not present

## 2017-10-05 DIAGNOSIS — I119 Hypertensive heart disease without heart failure: Secondary | ICD-10-CM | POA: Diagnosis not present

## 2017-10-05 DIAGNOSIS — Z6833 Body mass index (BMI) 33.0-33.9, adult: Secondary | ICD-10-CM | POA: Diagnosis not present

## 2017-10-12 ENCOUNTER — Encounter: Payer: Self-pay | Admitting: Emergency Medicine

## 2017-10-12 ENCOUNTER — Ambulatory Visit: Payer: PPO | Admitting: Emergency Medicine

## 2017-10-12 DIAGNOSIS — J9611 Chronic respiratory failure with hypoxia: Secondary | ICD-10-CM | POA: Diagnosis not present

## 2017-10-12 DIAGNOSIS — I10 Essential (primary) hypertension: Secondary | ICD-10-CM

## 2017-10-12 DIAGNOSIS — J449 Chronic obstructive pulmonary disease, unspecified: Secondary | ICD-10-CM

## 2017-10-12 NOTE — Assessment & Plan Note (Signed)
He is using bronchodilators frequently, actually doubling up since he is on the Anoro and on DuoNeb.  He is having insomnia and this may be related.  I will stop his Anoro, continue his DuoNeb on a schedule IV times a day.  He can use albuterol as needed for symptom management.

## 2017-10-12 NOTE — Assessment & Plan Note (Signed)
He has had weakness and lethargy, recently treated for a bronchitis but question whether this may be due to relative hypotension in the setting of medications.  His losartan was recently decreased and he may feel a little bit better.

## 2017-10-12 NOTE — Patient Instructions (Addendum)
Please stop Anoro now. Continue to use DuoNeb 4 times a day (up to every 6 hours) on a schedule. Keep albuterol available to use 2 puffs up to every 4 hours as needed for shortness of breath, wheezing, chest tightness. Finish your Levaquin as ordered. Continue your oxygen with exertion and while sleeping.  Follow with Dr Lamonte Sakai in 1 month or next available

## 2017-10-12 NOTE — Progress Notes (Signed)
@Patient  ID: Kevin Watkins, male    DOB: 02/16/1941, 77 y.o.   MRN: [id number]  Chief Complaint  Patient presents with  . Follow-up    Pt states he was doing well up until last thursday and was told he had bronchitis. Has c/o cough with brown mucus, SOB. Denies any CP or chest discomfort.    Referring provider: Ronita Hipps, MD  HPI: 77 year old male smoker seen for pulmonary critical care consult during hospitalization June 2018 with acute hypoxic and hypercarbic respiratory failure with suspected COPD exacerbation   ROV 03/23/17 -- Patient has a history of former tobacco use (recently quit), COPD, hypoxemic respiratory failure. He underwent pulmonary function testing today that I have reviewed. This shows severe obstruction with an FEV1 1.44 L (53% predicted), no bronchodilator response, hyperinflated lung volumes, decreased diffusion capacity that corrects to the normal range when adjusted for alveolar volume. He is currently managed on Anoro, believes that it helps him. He uses DuoNeb rarely, uses proAir about once a week.  He is using oxygen at night. sometimes w exertion.   ROV 08/01/17 --patient with a history of former tobacco, severe obstruction on pulmonary function testing, hypoxemic respiratory failure, on oxygen at night.  Walking oximetry last visit 03/2017 did not show desaturation on room air.  Currently using Anoro, albuterol which he uses approximately 3-4x a day.  Repeat ambulatory oximetry today showed that he did desaturate.  We titrated him to 2 L/min.  He was started on pred and levaquin last week for increased cough, hemoptysis, fever beginning 5 days ago at [facility]. CXR wasn't done.   ROV 10/12/17 --77 year old man, former smoker with severe COPD and associated nocturnal and exertional hypoxemia.  He has been on 2 L/min.  He reports that he had been doing well until about 1 week ago when he developed low energy, had cough was about the same. Mucous brown but about at his  baseline. He was given prednisone by his PCP last week. She also decreased his BP medication at that time. He is on Anoro. He uses albuterol a few times a day. He uses Duoneb 4x a day on a schedule. He describes poor sleep, insomnia.     No Known Allergies  Immunization History  Administered Date(s) Administered  . Influenza-Unspecified 04/01/2014, 04/07/2015, 03/30/2016, 04/10/2017  . Pneumococcal-Unspecified 06/21/2010, 01/27/2014  . Td 03/01/2012  . Zoster 03/05/2012    Past Medical History:  Diagnosis Date  . CAD in native artery 06/2000   BMS to the RCA; known 60% mid dominant RCA stenosis and 50-60% OM 2 branch stenosis normal LV function. Last Myoview May 2011 negative for ischemia.  . CHF (congestive heart failure) ([place])   . COPD (chronic obstructive pulmonary disease) ([place])   . Dyslipidemia, goal LDL below 70   . Edema   . EtOH dependence ([place])    Family reported  . Hypertension   . Oxygen deficiency   . PAD (peripheral artery disease) (H[facility])    iliac, SCA, Innominate, and renal PTA  . Tobacco abuse     Tobacco History: Social History   Tobacco Use  Smoking Status Current Every Day Smoker  . Packs/day: 1.00  . Types: Cigarettes  . Last attempt to quit: 12/17/2016  . Years since quitting: 0.8  Smokeless Tobacco Never Used  Tobacco Comment   pt states he is currently smoking 3cigs per day as of 10/12/17   Ready to quit: Not Answered Counseling given: Not Answered Comment: pt states  he is currently smoking 3cigs per day as of 10/12/17   Outpatient Encounter Medications as of 10/12/2017  Medication Sig  . apixaban (ELIQUIS) 5 MG TABS tablet Take 1 tablet (5 mg total) by mouth 2 (two) times daily.  [name] aspirin EC 81 MG tablet Take 81 mg by mouth daily.  [name] atorvastatin (LIPITOR) 40 MG tablet Take 40 mg by mouth daily at 6 PM.   . folic acid (FOLVITE) 1 MG tablet Take 800 mcg by mouth daily.   . furosemide (LASIX) 40 MG tablet Take 1 tablet by mouth daily. May take extra  40 mg daily in afternoon for weight gain of 3lbs  . Garlic 0626 MG CAPS Take 1 capsule by mouth daily.   [name] guaiFENesin-dextromethorphan (ROBITUSSIN DM) 100-10 MG/5ML syrup Take 5 mLs by mouth every 4 (four) hours as needed for cough.  [name] ipratropium-albuterol (DUONEB) 0.5-2.5 (3) MG/3ML SOLN Take 3 mLs by nebulization 4 (four) times daily as needed (shortness of breath).  [name] levofloxacin (LEVAQUIN) 500 MG tablet Take 500 mg by mouth daily.  . Melatonin 5 MG CAPS Take 10 mg by mouth at bedtime.  . metoprolol tartrate (LOPRESSOR) 25 MG tablet   . Omega-3 Fatty Acids (FISH OIL) 300 MG CAPS Take 2,400 mg by mouth daily.   . Polyethylene Glycol 3350 (MIRALAX PO) Take 17 g by mouth daily.   [name] PROAIR HFA 108 (90 Base) MCG/ACT inhaler Inhale 2 puffs into the lungs every 6 (six) hours as needed for wheezing or shortness of breath.  . ranitidine (ZANTAC) 75 MG tablet Take 75 mg by mouth daily.  [name] thiamine 100 MG tablet Take 1 tablet (100 mg total) by mouth daily.  [name] umeclidinium-vilanterol (ANORO ELLIPTA) 62.5-25 MCG/INH AEPB Inhale 1 puff daily into the lungs.  . varenicline (CHANTIX PAK) 0.5 MG X 11 & 1 MG X 42 tablet Take 0.5 mg by mouth 2 (two) times daily. Take one 0.5 mg tablet by mouth once daily for 3 days, then increase to one 0.5 mg tablet twice daily for 4 days, then increase to one 1 mg tablet twice daily.  . vitamin E (VITAMIN E) 400 UNIT capsule Take 400 Units by mouth daily.  . [DISCONTINUED] metoprolol tartrate (LOPRESSOR) 50 MG tablet Take 1/2 tablet twice a day  . [DISCONTINUED] predniSONE (DELTASONE) 20 MG tablet Take 20 mg by mouth daily with breakfast.  . losartan (COZAAR) 25 MG tablet Take 1 tablet (25 mg total) by mouth daily.  . nitroGLYCERIN (NITROSTAT) 0.4 MG SL tablet Place 0.4 mg under the tongue every 5 (five) minutes as needed for chest pain.   No facility-administered encounter medications on file as of 10/12/2017.      Review of Systems As per HPI   Physical Exam  BP  98/62 (BP Location: Left Arm, Cuff Size: Normal)   Pulse (!) 111   Ht 5\' 7"  (1.702 m)   Wt 203 lb 12.8 oz (92.4 kg)   SpO2 96%   BMI 31.92 kg/m   Gen: Pleasant, well-nourished, in no distress,  normal affect  ENT: No lesions,  mouth clear,  oropharynx clear, no postnasal drip  Neck: No JVD, no stridor  Lungs: No use of accessory muscles, very distant, no wheezing, no crackles  Cardiovascular: RRR, heart sounds normal, no murmur or gallops, no peripheral edema  Musculoskeletal: No deformities, no cyanosis or clubbing  Neuro: alert, non focal  Skin: Warm, no lesions or rash       Assessment & Plan:  COPD (chronic obstructive pulmonary disease) ([place]) He is using bronchodilators frequently, actually doubling up since he is on the Anoro and on DuoNeb.  He is having insomnia and this may be related.  I will stop his Anoro, continue his DuoNeb on a schedule IV times a day.  He can use albuterol as needed for symptom management.  Chronic respiratory failure with hypoxia (HCC) Continues oxygen with exertion andWith sleeping.  Essential hypertension He has had weakness and lethargy, recently treated for a bronchitis but question whether this may be due to relative hypotension in the setting of medications.  His losartan was recently decreased and he may feel a little bit better.  Baltazar Apo, MD, PhD 10/12/2017, 1:55 PM [facility] [phone] or if no answer [phone]

## 2017-10-12 NOTE — Addendum Note (Signed)
Addended by: Lorretta Harp on: 10/12/2017 01:57 PM   Modules accepted: Orders

## 2017-10-12 NOTE — Assessment & Plan Note (Signed)
Continues oxygen with exertion andWith sleeping.

## 2017-10-16 ENCOUNTER — Ambulatory Visit: Payer: PPO | Admitting: Emergency Medicine

## 2017-10-19 DIAGNOSIS — Z6832 Body mass index (BMI) 32.0-32.9, adult: Secondary | ICD-10-CM | POA: Diagnosis not present

## 2017-10-19 DIAGNOSIS — I119 Hypertensive heart disease without heart failure: Secondary | ICD-10-CM | POA: Diagnosis not present

## 2017-10-21 DIAGNOSIS — R0609 Other forms of dyspnea: Secondary | ICD-10-CM | POA: Diagnosis not present

## 2017-10-21 DIAGNOSIS — J449 Chronic obstructive pulmonary disease, unspecified: Secondary | ICD-10-CM | POA: Diagnosis not present

## 2017-10-21 DIAGNOSIS — I5032 Chronic diastolic (congestive) heart failure: Secondary | ICD-10-CM | POA: Diagnosis not present

## 2017-10-25 ENCOUNTER — Emergency Department (HOSPITAL_COMMUNITY): Payer: PPO

## 2017-10-25 ENCOUNTER — Other Ambulatory Visit: Payer: Self-pay

## 2017-10-25 ENCOUNTER — Emergency Department (HOSPITAL_COMMUNITY)
Admission: EM | Admit: 2017-10-25 | Discharge: 2017-10-25 | Disposition: A | Discharge status: Home / Self Care | Payer: PPO | Attending: Emergency Medicine | Admitting: Emergency Medicine

## 2017-10-25 ENCOUNTER — Encounter (HOSPITAL_COMMUNITY): Payer: Self-pay | Admitting: Emergency Medicine

## 2017-10-25 DIAGNOSIS — I509 Heart failure, unspecified: Secondary | ICD-10-CM | POA: Diagnosis not present

## 2017-10-25 DIAGNOSIS — I5033 Acute on chronic diastolic (congestive) heart failure: Secondary | ICD-10-CM | POA: Diagnosis not present

## 2017-10-25 DIAGNOSIS — D649 Anemia, unspecified: Secondary | ICD-10-CM | POA: Diagnosis not present

## 2017-10-25 DIAGNOSIS — Z7982 Long term (current) use of aspirin: Secondary | ICD-10-CM | POA: Insufficient documentation

## 2017-10-25 DIAGNOSIS — I110 Hypertensive heart disease with heart failure: Secondary | ICD-10-CM | POA: Insufficient documentation

## 2017-10-25 DIAGNOSIS — F17210 Nicotine dependence, cigarettes, uncomplicated: Secondary | ICD-10-CM | POA: Insufficient documentation

## 2017-10-25 DIAGNOSIS — I2510 Atherosclerotic heart disease of native coronary artery without angina pectoris: Secondary | ICD-10-CM | POA: Insufficient documentation

## 2017-10-25 DIAGNOSIS — E876 Hypokalemia: Secondary | ICD-10-CM | POA: Diagnosis not present

## 2017-10-25 DIAGNOSIS — J449 Chronic obstructive pulmonary disease, unspecified: Secondary | ICD-10-CM | POA: Diagnosis not present

## 2017-10-25 DIAGNOSIS — Z7901 Long term (current) use of anticoagulants: Secondary | ICD-10-CM | POA: Insufficient documentation

## 2017-10-25 DIAGNOSIS — Z79899 Other long term (current) drug therapy: Secondary | ICD-10-CM | POA: Insufficient documentation

## 2017-10-25 DIAGNOSIS — R0602 Shortness of breath: Secondary | ICD-10-CM | POA: Diagnosis not present

## 2017-10-25 LAB — CBC
HCT: 26.9 % — ABNORMAL LOW (ref 39.0–52.0)
HEMATOCRIT: 27.7 % — AB (ref 39.0–52.0)
HEMOGLOBIN: 8.8 g/dL — AB (ref 13.0–17.0)
Hemoglobin: 8.5 g/dL — ABNORMAL LOW (ref 13.0–17.0)
MCH: 27 pg (ref 26.0–34.0)
MCH: 27.2 pg (ref 26.0–34.0)
MCHC: 31.6 g/dL (ref 30.0–36.0)
MCHC: 31.8 g/dL (ref 30.0–36.0)
MCV: 85.4 fL (ref 78.0–100.0)
MCV: 85.5 fL (ref 78.0–100.0)
PLATELETS: 137 10*3/uL — AB (ref 150–400)
Platelets: 133 10*3/uL — ABNORMAL LOW (ref 150–400)
RBC: 3.15 MIL/uL — AB (ref 4.22–5.81)
RBC: 3.24 MIL/uL — ABNORMAL LOW (ref 4.22–5.81)
RDW: 18.4 % — AB (ref 11.5–15.5)
RDW: 18.2 % — ABNORMAL HIGH (ref 11.5–15.5)
WBC: 5.7 10*3/uL (ref 4.0–10.5)
WBC: 5.8 10*3/uL (ref 4.0–10.5)

## 2017-10-25 LAB — BASIC METABOLIC PANEL
Anion gap: 10 (ref 5–15)
BUN: 18 mg/dL (ref 6–20)
CO2: 27 mmol/L (ref 22–32)
CREATININE: 1.63 mg/dL — AB (ref 0.61–1.24)
Calcium: 8.9 mg/dL (ref 8.9–10.3)
Chloride: 98 mmol/L — ABNORMAL LOW (ref 101–111)
GFR, EST AFRICAN AMERICAN: 45 mL/min — AB (ref >60)
GFR, EST NON AFRICAN AMERICAN: 39 mL/min — AB (ref >60)
Glucose, Bld: 115 mg/dL — ABNORMAL HIGH (ref 65–99)
Potassium: 3.4 mmol/L — ABNORMAL LOW (ref 3.5–5.1)
SODIUM: 135 mmol/L (ref 135–145)

## 2017-10-25 MED ORDER — POTASSIUM CHLORIDE CRYS ER 20 MEQ PO TBCR: 20.0000 meq | EXTENDED_RELEASE_TABLET | Freq: Every day | ORAL | 0 refills | Status: DC

## 2017-10-25 MED ORDER — POTASSIUM CHLORIDE CRYS ER 20 MEQ PO TBCR
20.0000 meq | EXTENDED_RELEASE_TABLET | Freq: Once | ORAL | Status: AC
  Administered 2017-10-25: 20 meq via ORAL
  Filled 2017-10-25: qty 1

## 2017-10-25 MED ORDER — FERROUS SULFATE 325 (65 FE) MG PO TABS: 325.0000 mg | ORAL_TABLET | Freq: Two times a day (BID) | ORAL | 0 refills | Status: AC

## 2017-10-25 NOTE — ED Notes (Addendum)
Patient transported to X-ray 

## 2017-10-25 NOTE — ED Notes (Signed)
Patient verbalizes understanding of discharge instructions. Opportunity for questioning and answers were provided. Armband removed by staff, pt discharged from ED.  

## 2017-10-25 NOTE — ED Provider Notes (Signed)
[facility] EMERGENCY DEPARTMENT Provider Note   CSN: [id number] Arrival date & time: 10/25/17  1058     History   Chief Complaint Chief Complaint  Patient presents with  . Shortness of Breath    HPI Kevin Watkins is a 77 y.o. male.  HPI  77 year old male presents with a chief complaint of fatigue.  He has also been noticing low blood pressure for about 1 month.  Often his blood pressure has been around 80 systolic.  He recently saw his pulmonologist and had some of his medicine reduced.  The patient states the fatigue is mostly a no energy sensation that has been going on for 2-3 months.  He is chronically short of breath but it is not worse today.  He has an intermittent cough but occasionally has bloody sputum.  The cough is chronic but the blood and sputum is new over the last 1 month.  He denies any chest pain.  No abdominal pain or other obvious signs of bleeding.  He states that he has not had any bloody stools or melena.  He has some lower extremity swelling but he states is a little worse over the last day or 2 but typically his weights have been very stable recently.  Past Medical History:  Diagnosis Date  . CAD in native artery 06/2000   BMS to the RCA; known 60% mid dominant RCA stenosis and 50-60% OM 2 branch stenosis normal LV function. Last Myoview May 2011 negative for ischemia.  . CHF (congestive heart failure) ([place])   . COPD (chronic obstructive pulmonary disease) ([place])   . Dyslipidemia, goal LDL below 70   . Edema   . EtOH dependence ([place])    Family reported  . Hypertension   . Oxygen deficiency   . PAD (peripheral artery disease) (HCC)    iliac, SCA, Innominate, and renal PTA  . Tobacco abuse     Patient Active Problem List   Diagnosis Date Noted  . CAP (community acquired pneumonia) 08/01/2017  . Chronic respiratory failure with hypoxia ([place]) 01/17/2017  . Pulmonary edema   . Acute respiratory failure with hypoxia and hypercarbia ([place])  12/19/2016  . COPD (chronic obstructive pulmonary disease) ([place])   . Dyspnea 12/17/2016  . Acute respiratory failure with hypoxia ([place]) 06/23/2016  . Obesity 06/23/2016  . Chronic anticoagulation 06/23/2016  . Paroxysmal atrial fibrillation ([place]) 05/30/2016  . Bilateral carotid artery disease ([place]) 10/30/2015  . Epistaxis 04/30/2015  . Chronic diastolic heart failure ([place]) 06/03/2014  . Acute diastolic CHF (congestive heart failure) ([place]) 10/30/2013  . Bilateral leg edema 02/15/2013  . CAD-RCA BMS 2001, low risk Myoview 2011   . PAD- multiple interventions- renal, iliac, SCA   . Tobacco abuse   . Dyslipidemia, goal LDL below 70   . Essential hypertension     Past Surgical History:  Procedure Laterality Date  . ANGIOPLASTY  '01, '03   innominate artery PTA  . Carotid Doppler  2013   R vertebral appears occluded, R subclavian with prox occlusion v. high grade stenosis; R bulb 70-99% diameter reduction; R prox ICA 50-69% diameter reduction; L bulb & prox ICA 50-69% diameter reduction; L subclavian with >50% diameter reduction  . CORONARY ANGIOPLASTY WITH STENT PLACEMENT  06/26/2000    BMS to RCA; residual 60%mid dominant RCA stent, 50-60% OM2 stenosis (Dr. Adora Fridge)  . NM MYOCAR PERF WALL MOTION  2011   persantine myoview - normal perfusion in all regions, EF 64%  .  RENAL ANGIOGRAM  09/22/2005   stent to right renal artery with 5x12 Genesis on Aviator stent balloon pre-mount (Dr. Adora Fridge)  . Renal Doppler  2013   R prox renal artery stent with 60-99% in-stent restenosis; L prox renal artery =/> 60% diameter reduction; R & L kidneys normal in size  . SUBCLAVIAN ANGIOGRAM  07/15/2004   stent to right subclavian (7x18 Genesis) and innominate (8x24 Genesis), with known ostial left common carotid stenosis and left subclavian stenosis as well (Dr. Adora Fridge)  . TRANSTHORACIC ECHOCARDIOGRAM  2013   EF=>55%, mild conc LVH; LA mod dilated; RA mild-mod dilated; mild mitral annular calcif, mild  MR; mild TR with normal RSVP  . Upper Extremity Arterial Doppler  2013   R prox subclavian artery stent with prox occlusion v. high grade stenosis; R vertebral appears occluded; L subclavian with >60% diameter reduction        Home Medications    Prior to Admission medications   Medication Sig Start Date End Date Taking? Authorizing Provider  apixaban (ELIQUIS) 5 MG TABS tablet Take 1 tablet (5 mg total) by mouth 2 (two) times daily. 06/05/17  Yes Lorretta Harp, MD  aspirin EC 81 MG tablet Take 81 mg by mouth daily.   Yes [provider]  atorvastatin (LIPITOR) 40 MG tablet Take 40 mg by mouth daily at 6 PM.  12/26/12  Yes [provider]  diphenhydrAMINE (BENADRYL) 25 MG tablet Take 25 mg by mouth as needed for sleep.   Yes [provider]  folic acid (FOLVITE) 1 MG tablet Take 800 mcg by mouth daily.    Yes [provider]  furosemide (LASIX) 40 MG tablet Take 1 tablet by mouth daily. May take extra 40 mg daily in afternoon for weight gain of 3lbs 06/06/17  Yes Lorretta Harp, MD  Garlic 0626 MG CAPS Take 1 capsule by mouth daily.    Yes [provider]  guaiFENesin-dextromethorphan (ROBITUSSIN DM) 100-10 MG/5ML syrup Take 5 mLs by mouth every 4 (four) hours as needed for cough. 12/24/16  Yes Alphonzo Grieve, MD  ipratropium-albuterol (DUONEB) 0.5-2.5 (3) MG/3ML SOLN Take 3 mLs by nebulization 4 (four) times daily.    Yes [provider]  Lactobacillus (PROBIOTIC ACIDOPHILUS PO) Take 1 tablet by mouth daily.   Yes [provider]  losartan (COZAAR) 25 MG tablet Take 1 tablet (25 mg total) by mouth daily. Patient taking differently: Take 12.5 mg by mouth daily.  12/24/16 10/25/17 Yes Alphonzo Grieve, MD  metoprolol tartrate (LOPRESSOR) 25 MG tablet Take 12.5 mg by mouth See admin instructions. Taking 1/2 tablet( 12.5 mg) in the AM and 1/2 tablet (12.5mg ) in the P.M. 08/16/17  Yes [provider]  nitroGLYCERIN  (NITROSTAT) 0.4 MG SL tablet Place 0.4 mg under the tongue every 5 (five) minutes as needed for chest pain.   Yes [provider]  Omega-3 Fatty Acids (FISH OIL) 300 MG CAPS Take 1,200 mg by mouth 2 (two) times daily.    Yes [provider]  Polyethylene Glycol 3350 (MIRALAX PO) Take 17 g by mouth daily.    Yes [provider]  PROAIR HFA 108 (90 Base) MCG/ACT inhaler Inhale 2 puffs into the lungs every 6 (six) hours as needed for wheezing or shortness of breath. 12/24/16  Yes Alphonzo Grieve, MD  ranitidine (ZANTAC) 75 MG tablet Take 75 mg by mouth daily.   Yes [provider]  thiamine 100 MG tablet Take 1 tablet (100 mg  total) by mouth daily. 12/28/16  Yes Lorretta Harp, MD  varenicline (CHANTIX PAK) 0.5 MG X 11 & 1 MG X 42 tablet Take 0.5 mg by mouth 2 (two) times daily. Take one 0.5 mg tablet by mouth once daily for 3 days, then increase to one 0.5 mg tablet twice daily for 4 days, then increase to one 1 mg tablet twice daily.   Yes [provider]  vitamin E (VITAMIN E) 400 UNIT capsule Take 400 Units by mouth daily.   Yes [provider]  ferrous sulfate 325 (65 FE) MG tablet Take 1 tablet (325 mg total) by mouth 2 (two) times daily with a meal. 10/25/17   Sherwood Gambler, MD  potassium chloride SA (K-DUR,KLOR-CON) 20 MEQ tablet Take 1 tablet (20 mEq total) by mouth daily. 10/25/17   Sherwood Gambler, MD    Family History Family History  Problem Relation Age of Onset  . Heart disease Mother        hx CABG  . Heart disease Father        hx of CABG  . Cancer Sister   . Heart attack Brother   . Heart disease Brother     Social History Social History   Tobacco Use  . Smoking status: Current Every Day Smoker    Packs/day: 1.00    Types: Cigarettes    Last attempt to quit: 12/17/2016    Years since quitting: 0.8  . Smokeless tobacco: Never Used  . Tobacco comment: pt states he is currently smoking 3cigs per day as of 10/12/17    Substance Use Topics  . Alcohol use: Yes    Alcohol/week: 12.6 oz    Types: 21 Cans of beer per week    Comment: beer  . Drug use: No     Allergies   Patient has no known allergies.   Review of Systems Review of Systems  Constitutional: Positive for fatigue.  Respiratory: Positive for cough and shortness of breath (chronic, unchanged).   Cardiovascular: Positive for leg swelling. Negative for chest pain.  Gastrointestinal: Negative for abdominal pain, blood in stool and vomiting.  Neurological: Positive for weakness.  All other systems reviewed and are negative.    Physical Exam Updated Vital Signs BP 119/74   Pulse 94   Temp 98.2 F (36.8 C) (Oral)   Resp 20   Ht 5\' 7"  (1.702 m)   Wt 90.7 kg (200 lb)   SpO2 96%   BMI 31.32 kg/m   Physical Exam  Constitutional: He is oriented to person, place, and time. He appears well-developed and well-nourished. No distress.  HENT:  Head: Normocephalic and atraumatic.  Right Ear: External ear normal.  Left Ear: External ear normal.  Nose: Nose normal.  Eyes: Right eye exhibits no discharge. Left eye exhibits no discharge.  Neck: Neck supple.  Cardiovascular: Normal heart sounds. An irregular rhythm present. Tachycardia present.  HR~100  Pulmonary/Chest: Effort normal. No accessory muscle usage. No tachypnea. He has decreased breath sounds in the right lower field and the left lower field. He has no wheezes.  Abdominal: Soft. There is no tenderness.  Musculoskeletal: He exhibits edema (mild bilateral pitting edema to bilateral feet/ankles).       Right lower leg: He exhibits edema (mild).       Left lower leg: He exhibits edema (mild).  Neurological: He is alert and oriented to person, place, and time.  Skin: Skin is warm and dry. He is not diaphoretic.  Nursing note  and vitals reviewed.    ED Treatments / Results  Labs (all labs ordered are listed, but only abnormal results are displayed) Labs Reviewed  BASIC  METABOLIC PANEL - Abnormal; Notable for the following components:      Result Value   Potassium 3.4 (*)    Chloride 98 (*)    Glucose, Bld 115 (*)    Creatinine, Ser 1.63 (*)    GFR calc non Af Amer 39 (*)    GFR calc Af Amer 45 (*)    All other components within normal limits  CBC - Abnormal; Notable for the following components:   RBC 3.15 (*)    Hemoglobin 8.5 (*)    HCT 26.9 (*)    RDW 18.4 (*)    Platelets 137 (*)    All other components within normal limits  CBC - Abnormal; Notable for the following components:   RBC 3.24 (*)    Hemoglobin 8.8 (*)    HCT 27.7 (*)    RDW 18.2 (*)    Platelets 133 (*)    All other components within normal limits    EKG EKG Interpretation  Date/Time:  Wednesday October 25 2017 11:10:43 EDT Ventricular Rate:  102 PR Interval:    QRS Duration: 90 QT Interval:  352 QTC Calculation: 458 R Axis:   70 Text Interpretation:  Atrial fibrillation with rapid ventricular response ST & T wave abnormality, consider inferior ischemia Abnormal ECG no significant change since June 2018 Confirmed by Sherwood Gambler [phone]) on 10/25/2017 2:55:21 PM   Radiology Dg Chest 2 View  Result Date: 10/25/2017 CLINICAL DATA:  Shortness of breath over the last 2 weeks. Hemoptysis over the last month. EXAM: CHEST - 2 VIEW COMPARISON:  08/01/2017 FINDINGS: Heart size at the upper limits of normal. Chronic aortic atherosclerosis. Chronic interstitial lung disease. Possible superimposed interstitial edema. No focal lesion otherwise. Bilateral effusions with dependent atelectasis. IMPRESSION: Background pattern of chronic interstitial lung disease, possibly with superimposed interstitial edema. Small effusions and dependent atelectasis. Electronically Signed   By: Nelson Chimes M.D.   On: 10/25/2017 16:41    Procedures Procedures (including critical care time)  Medications Ordered in ED Medications  potassium chloride SA (K-DUR,KLOR-CON) CR tablet 20 mEq (20 mEq Oral  Given 10/25/17 1647)     Initial Impression / Assessment and Plan / ED Course  I have reviewed the triage vital signs and the nursing notes.  Pertinent labs & imaging results that were available during my care of the patient were reviewed by me and considered in my medical decision making (see chart for details).     Patient's fatigue for several months is likely due to anemia.  Last hemoglobin prior to today was around 11 and was almost 1 year ago.  He has not had any significant bleeding besides mild hemoptysis for about a month.  He denies melena or hematochezia.  Given how long he was in the ER prior to being seen by me I have ordered a repeat CBC and his hemoglobin is slightly improved.  Thus I doubt he is acutely bleeding and this is likely a subacute process.  He will be started on iron.  His shortness of breath is chronic but he is noted to have some pulmonary edema.  Given he has no increased work of breathing and no hypoxia on his chronic oxygen, this appears to be a mild CHF process.  He states that usually whenever this happens he increases his Lasix from 40 mg  a day to 40 twice daily for 3 days.  I have encouraged him to do this and he will be given potassium replacement to account for the increased Lasix.  Otherwise he appears stable for discharge home as there does not appear to be an emergent process.  I think the hemoptysis can be followed up as an outpatient and could just be from pulmonary edema.  I doubt PE while he is on Eliquis.  Discussed return precautions.  Final Clinical Impressions(s) / ED Diagnoses   Final diagnoses:  Symptomatic anemia  Acute on chronic congestive heart failure, unspecified heart failure type (HCC)  Hypokalemia    ED Discharge Orders        Ordered    ferrous sulfate 325 (65 FE) MG tablet  2 times daily with meals     10/25/17 1704    potassium chloride SA (K-DUR,KLOR-CON) 20 MEQ tablet  Daily     10/25/17 1704       Sherwood Gambler,  MD 10/25/17 1814

## 2017-10-25 NOTE — Discharge Instructions (Signed)
Follow-up with your primary care doctor within a week.  You need your hemoglobin rechecked and followed.  If you develop bleeding from any source, return to the ER or see her primary care doctor.  You appear to have too much fluid today and you will need to increase your Lasix.  As per your prior instructions, increase to 40 mg twice per day instead of once per day over the next 3 days.

## 2017-10-25 NOTE — ED Triage Notes (Signed)
Pt arrives via POV from home with SOB off and on for the last month. Pt reports no energy to do things. States his blood pressure has been running low in the 80s. Reports recent changes to meds. Wears 2L [place] at baseline. Denies recent fever, cough. Lung sounds clear/diminished. VSS. Pt NAD at present.

## 2017-10-27 ENCOUNTER — Other Ambulatory Visit: Payer: Self-pay

## 2017-10-27 DIAGNOSIS — Z9981 Dependence on supplemental oxygen: Secondary | ICD-10-CM | POA: Diagnosis not present

## 2017-10-27 DIAGNOSIS — J449 Chronic obstructive pulmonary disease, unspecified: Secondary | ICD-10-CM | POA: Diagnosis not present

## 2017-10-27 DIAGNOSIS — R040 Epistaxis: Secondary | ICD-10-CM | POA: Diagnosis not present

## 2017-10-27 NOTE — Patient Outreach (Signed)
[facility]) Care Management  10/27/2017  Kevin Watkins 10/27/1940 [id number]   Telephone Screen  Referral Date: 10/26/17 Referral Source: [facility] [facility] Dept. Referral Reason: "member would like to receive medication co-pay assistance" Insurance: [employer]   Outreach attempt #1 to patient. Spoke with souse who reported now was not a good time as patient currently has gotten a "nosebleed from the oxygen" and they are working on that. Advised that RN CM would call back at another time.       Plan: RN CM will make outreach attempt to patient within 3-4 business days. RN CM will send unsuccessful outreach letter to patient.   Enzo Montgomery, RN,BSN,CCM [facility] Telephonic Care Management Coordinator Direct Phone: [phone] Toll Free: [phone] Fax: [phone]

## 2017-10-28 ENCOUNTER — Inpatient Hospital Stay (HOSPITAL_COMMUNITY)
Admission: EM | Admit: 2017-10-28 | Discharge: 2017-11-03 | DRG: 291 | Disposition: A | Discharge status: Home Health | Payer: PPO | Attending: Internal Medicine | Admitting: Internal Medicine

## 2017-10-28 ENCOUNTER — Emergency Department (HOSPITAL_COMMUNITY): Payer: PPO

## 2017-10-28 ENCOUNTER — Encounter (HOSPITAL_COMMUNITY): Payer: Self-pay | Admitting: Emergency Medicine

## 2017-10-28 DIAGNOSIS — J449 Chronic obstructive pulmonary disease, unspecified: Secondary | ICD-10-CM | POA: Diagnosis present

## 2017-10-28 DIAGNOSIS — D509 Iron deficiency anemia, unspecified: Secondary | ICD-10-CM | POA: Diagnosis present

## 2017-10-28 DIAGNOSIS — E785 Hyperlipidemia, unspecified: Secondary | ICD-10-CM | POA: Diagnosis present

## 2017-10-28 DIAGNOSIS — I482 Chronic atrial fibrillation: Secondary | ICD-10-CM | POA: Diagnosis present

## 2017-10-28 DIAGNOSIS — R040 Epistaxis: Secondary | ICD-10-CM | POA: Diagnosis present

## 2017-10-28 DIAGNOSIS — R296 Repeated falls: Secondary | ICD-10-CM | POA: Diagnosis present

## 2017-10-28 DIAGNOSIS — I5033 Acute on chronic diastolic (congestive) heart failure: Secondary | ICD-10-CM | POA: Diagnosis present

## 2017-10-28 DIAGNOSIS — R0602 Shortness of breath: Secondary | ICD-10-CM | POA: Diagnosis present

## 2017-10-28 DIAGNOSIS — Z7982 Long term (current) use of aspirin: Secondary | ICD-10-CM | POA: Diagnosis not present

## 2017-10-28 DIAGNOSIS — Z955 Presence of coronary angioplasty implant and graft: Secondary | ICD-10-CM

## 2017-10-28 DIAGNOSIS — Z720 Tobacco use: Secondary | ICD-10-CM | POA: Diagnosis not present

## 2017-10-28 DIAGNOSIS — K219 Gastro-esophageal reflux disease without esophagitis: Secondary | ICD-10-CM | POA: Diagnosis present

## 2017-10-28 DIAGNOSIS — J441 Chronic obstructive pulmonary disease with (acute) exacerbation: Secondary | ICD-10-CM | POA: Diagnosis present

## 2017-10-28 DIAGNOSIS — D649 Anemia, unspecified: Secondary | ICD-10-CM | POA: Insufficient documentation

## 2017-10-28 DIAGNOSIS — Z87891 Personal history of nicotine dependence: Secondary | ICD-10-CM | POA: Diagnosis not present

## 2017-10-28 DIAGNOSIS — I5032 Chronic diastolic (congestive) heart failure: Secondary | ICD-10-CM | POA: Diagnosis not present

## 2017-10-28 DIAGNOSIS — D123 Benign neoplasm of transverse colon: Secondary | ICD-10-CM | POA: Diagnosis not present

## 2017-10-28 DIAGNOSIS — I481 Persistent atrial fibrillation: Secondary | ICD-10-CM | POA: Diagnosis present

## 2017-10-28 DIAGNOSIS — J9621 Acute and chronic respiratory failure with hypoxia: Secondary | ICD-10-CM

## 2017-10-28 DIAGNOSIS — K5730 Diverticulosis of large intestine without perforation or abscess without bleeding: Secondary | ICD-10-CM | POA: Diagnosis present

## 2017-10-28 DIAGNOSIS — I4891 Unspecified atrial fibrillation: Secondary | ICD-10-CM | POA: Diagnosis not present

## 2017-10-28 DIAGNOSIS — I130 Hypertensive heart and chronic kidney disease with heart failure and stage 1 through stage 4 chronic kidney disease, or unspecified chronic kidney disease: Principal | ICD-10-CM | POA: Diagnosis present

## 2017-10-28 DIAGNOSIS — Z79899 Other long term (current) drug therapy: Secondary | ICD-10-CM

## 2017-10-28 DIAGNOSIS — I2510 Atherosclerotic heart disease of native coronary artery without angina pectoris: Secondary | ICD-10-CM | POA: Diagnosis present

## 2017-10-28 DIAGNOSIS — I739 Peripheral vascular disease, unspecified: Secondary | ICD-10-CM | POA: Diagnosis present

## 2017-10-28 DIAGNOSIS — I4819 Other persistent atrial fibrillation: Secondary | ICD-10-CM

## 2017-10-28 DIAGNOSIS — I10 Essential (primary) hypertension: Secondary | ICD-10-CM | POA: Diagnosis not present

## 2017-10-28 DIAGNOSIS — I509 Heart failure, unspecified: Secondary | ICD-10-CM | POA: Diagnosis not present

## 2017-10-28 DIAGNOSIS — K648 Other hemorrhoids: Secondary | ICD-10-CM | POA: Diagnosis not present

## 2017-10-28 DIAGNOSIS — I480 Paroxysmal atrial fibrillation: Secondary | ICD-10-CM | POA: Diagnosis present

## 2017-10-28 DIAGNOSIS — R195 Other fecal abnormalities: Secondary | ICD-10-CM | POA: Diagnosis not present

## 2017-10-28 DIAGNOSIS — F1010 Alcohol abuse, uncomplicated: Secondary | ICD-10-CM | POA: Diagnosis present

## 2017-10-28 DIAGNOSIS — Z9981 Dependence on supplemental oxygen: Secondary | ICD-10-CM

## 2017-10-28 DIAGNOSIS — D500 Iron deficiency anemia secondary to blood loss (chronic): Secondary | ICD-10-CM | POA: Diagnosis present

## 2017-10-28 DIAGNOSIS — N183 Chronic kidney disease, stage 3 unspecified: Secondary | ICD-10-CM | POA: Diagnosis present

## 2017-10-28 DIAGNOSIS — Z7901 Long term (current) use of anticoagulants: Secondary | ICD-10-CM | POA: Diagnosis not present

## 2017-10-28 DIAGNOSIS — D638 Anemia in other chronic diseases classified elsewhere: Secondary | ICD-10-CM | POA: Diagnosis present

## 2017-10-28 DIAGNOSIS — D6489 Other specified anemias: Secondary | ICD-10-CM

## 2017-10-28 DIAGNOSIS — R05 Cough: Secondary | ICD-10-CM | POA: Diagnosis not present

## 2017-10-28 DIAGNOSIS — R0609 Other forms of dyspnea: Secondary | ICD-10-CM | POA: Diagnosis not present

## 2017-10-28 DIAGNOSIS — L8990 Pressure ulcer of unspecified site, unspecified stage: Secondary | ICD-10-CM

## 2017-10-28 LAB — BASIC METABOLIC PANEL
ANION GAP: 11 (ref 5–15)
BUN: 27 mg/dL — ABNORMAL HIGH (ref 6–20)
CHLORIDE: 101 mmol/L (ref 101–111)
CO2: 25 mmol/L (ref 22–32)
Calcium: 9.2 mg/dL (ref 8.9–10.3)
Creatinine, Ser: 1.53 mg/dL — ABNORMAL HIGH (ref 0.61–1.24)
GFR calc non Af Amer: 42 mL/min — ABNORMAL LOW (ref >60)
GFR, EST AFRICAN AMERICAN: 48 mL/min — AB (ref >60)
Glucose, Bld: 120 mg/dL — ABNORMAL HIGH (ref 65–99)
POTASSIUM: 4.2 mmol/L (ref 3.5–5.1)
Sodium: 137 mmol/L (ref 135–145)

## 2017-10-28 LAB — I-STAT TROPONIN, ED: Troponin i, poc: 0.01 ng/mL (ref 0.00–0.08)

## 2017-10-28 LAB — CBC
HEMATOCRIT: 24.5 % — AB (ref 39.0–52.0)
Hemoglobin: 7.9 g/dL — ABNORMAL LOW (ref 13.0–17.0)
MCH: 28 pg (ref 26.0–34.0)
MCHC: 32.2 g/dL (ref 30.0–36.0)
MCV: 86.9 fL (ref 78.0–100.0)
Platelets: 154 10*3/uL (ref 150–400)
RBC: 2.82 MIL/uL — AB (ref 4.22–5.81)
RDW: 19.1 % — ABNORMAL HIGH (ref 11.5–15.5)
WBC: 7 10*3/uL (ref 4.0–10.5)

## 2017-10-28 LAB — TYPE AND SCREEN
ABO/RH(D): AB POS
Antibody Screen: NEGATIVE

## 2017-10-28 LAB — BRAIN NATRIURETIC PEPTIDE: B NATRIURETIC PEPTIDE 5: 427.9 pg/mL — AB (ref 0.0–100.0)

## 2017-10-28 MED ORDER — ATORVASTATIN CALCIUM 40 MG PO TABS
40.0000 mg | ORAL_TABLET | Freq: Every day | ORAL | Status: DC
  Administered 2017-10-29 – 2017-11-03 (×6): 40 mg via ORAL
  Filled 2017-10-28 (×6): qty 1

## 2017-10-28 MED ORDER — VITAMIN E 180 MG (400 UNIT) PO CAPS
400.0000 [IU] | ORAL_CAPSULE | Freq: Every day | ORAL | Status: DC
  Administered 2017-10-30 – 2017-11-03 (×4): 400 [IU] via ORAL
  Filled 2017-10-28 (×7): qty 1

## 2017-10-28 MED ORDER — BACID PO TABS
1.0000 | ORAL_TABLET | Freq: Every day | ORAL | Status: DC
  Filled 2017-10-28: qty 1

## 2017-10-28 MED ORDER — SODIUM CHLORIDE 0.9% FLUSH
3.0000 mL | Freq: Two times a day (BID) | INTRAVENOUS | Status: DC
  Administered 2017-10-29 – 2017-11-03 (×9): 3 mL via INTRAVENOUS

## 2017-10-28 MED ORDER — GARLIC 1000 MG PO CAPS: 1000.0000 mg | ORAL_CAPSULE | Freq: Every day | ORAL | Status: DC

## 2017-10-28 MED ORDER — ENOXAPARIN SODIUM 40 MG/0.4ML ~~LOC~~ SOLN
40.0000 mg | SUBCUTANEOUS | Status: DC
  Administered 2017-10-29: 40 mg via SUBCUTANEOUS
  Filled 2017-10-28 (×2): qty 0.4

## 2017-10-28 MED ORDER — GUAIFENESIN 100 MG/5ML PO SOLN: 100.0000 mg | ORAL | Status: DC | PRN

## 2017-10-28 MED ORDER — IPRATROPIUM-ALBUTEROL 0.5-2.5 (3) MG/3ML IN SOLN
3.0000 mL | RESPIRATORY_TRACT | Status: DC
  Administered 2017-10-29 (×2): 3 mL via RESPIRATORY_TRACT
  Filled 2017-10-28 (×4): qty 3

## 2017-10-28 MED ORDER — FUROSEMIDE 10 MG/ML IJ SOLN
40.0000 mg | Freq: Every day | INTRAMUSCULAR | Status: DC
  Administered 2017-10-29 – 2017-10-31 (×3): 40 mg via INTRAVENOUS
  Filled 2017-10-28 (×3): qty 4

## 2017-10-28 MED ORDER — LORAZEPAM 1 MG PO TABS
1.0000 mg | ORAL_TABLET | Freq: Four times a day (QID) | ORAL | Status: DC | PRN
  Filled 2017-10-28: qty 1

## 2017-10-28 MED ORDER — FOLIC ACID 1 MG PO TABS
1.0000 mg | ORAL_TABLET | Freq: Every day | ORAL | Status: DC
  Administered 2017-10-29 – 2017-11-03 (×6): 1 mg via ORAL
  Filled 2017-10-28 (×6): qty 1

## 2017-10-28 MED ORDER — VARENICLINE TARTRATE 0.5 MG X 11 & 1 MG X 42 PO MISC: 1.0000 | ORAL | Status: DC

## 2017-10-28 MED ORDER — ADULT MULTIVITAMIN W/MINERALS CH
1.0000 | ORAL_TABLET | Freq: Every day | ORAL | Status: DC
  Administered 2017-10-29 – 2017-11-03 (×6): 1 via ORAL
  Filled 2017-10-28 (×6): qty 1

## 2017-10-28 MED ORDER — ONDANSETRON HCL 4 MG/2ML IJ SOLN: 4.0000 mg | Freq: Four times a day (QID) | INTRAMUSCULAR | Status: DC | PRN

## 2017-10-28 MED ORDER — METOPROLOL TARTRATE 12.5 MG HALF TABLET
12.5000 mg | ORAL_TABLET | Freq: Two times a day (BID) | ORAL | Status: DC
  Administered 2017-10-29 – 2017-10-31 (×5): 12.5 mg via ORAL
  Filled 2017-10-28 (×6): qty 1

## 2017-10-28 MED ORDER — ALBUTEROL SULFATE (2.5 MG/3ML) 0.083% IN NEBU
2.5000 mg | INHALATION_SOLUTION | RESPIRATORY_TRACT | Status: DC | PRN
Start: 2017-10-28 — End: 2017-11-03
  Administered 2017-10-31 – 2017-11-03 (×7): 2.5 mg via RESPIRATORY_TRACT
  Filled 2017-10-28 (×7): qty 3

## 2017-10-28 MED ORDER — FUROSEMIDE 10 MG/ML IJ SOLN
40.0000 mg | Freq: Once | INTRAMUSCULAR | Status: AC
  Administered 2017-10-28: 40 mg via INTRAVENOUS
  Filled 2017-10-28: qty 4

## 2017-10-28 MED ORDER — FOLIC ACID 800 MCG PO TABS: 800.0000 ug | ORAL_TABLET | Freq: Every day | ORAL | Status: DC

## 2017-10-28 MED ORDER — FERROUS SULFATE 325 (65 FE) MG PO TABS
325.0000 mg | ORAL_TABLET | Freq: Two times a day (BID) | ORAL | Status: DC
  Administered 2017-10-29 – 2017-11-03 (×11): 325 mg via ORAL
  Filled 2017-10-28 (×11): qty 1

## 2017-10-28 MED ORDER — BENEFIBER PO POWD: 1.0000 | Freq: Every day | ORAL | Status: DC

## 2017-10-28 MED ORDER — THIAMINE HCL 100 MG/ML IJ SOLN
100.0000 mg | Freq: Every day | INTRAMUSCULAR | Status: DC
  Filled 2017-10-28: qty 2

## 2017-10-28 MED ORDER — ACETAMINOPHEN 325 MG PO TABS: 650.0000 mg | ORAL_TABLET | ORAL | Status: DC | PRN

## 2017-10-28 MED ORDER — VITAMIN B50 COMPLEX PO TBCR: 1.0000 | EXTENDED_RELEASE_TABLET | Freq: Every day | ORAL | Status: DC

## 2017-10-28 MED ORDER — FUROSEMIDE 20 MG PO TABS: 40.0000 mg | ORAL_TABLET | ORAL | Status: DC

## 2017-10-28 MED ORDER — ASPIRIN EC 81 MG PO TBEC
81.0000 mg | DELAYED_RELEASE_TABLET | Freq: Every day | ORAL | Status: DC
  Administered 2017-10-29: 81 mg via ORAL
  Filled 2017-10-28: qty 1

## 2017-10-28 MED ORDER — ZOLPIDEM TARTRATE 5 MG PO TABS: 5.0000 mg | ORAL_TABLET | Freq: Every evening | ORAL | Status: DC | PRN

## 2017-10-28 MED ORDER — PREDNISONE 20 MG PO TABS: 40.0000 mg | ORAL_TABLET | Freq: Once | ORAL | Status: DC

## 2017-10-28 MED ORDER — VITAMIN B-1 100 MG PO TABS
100.0000 mg | ORAL_TABLET | Freq: Every day | ORAL | Status: DC
  Administered 2017-10-29 – 2017-11-03 (×6): 100 mg via ORAL
  Filled 2017-10-28 (×6): qty 1

## 2017-10-28 MED ORDER — METHYLPREDNISOLONE SODIUM SUCC 125 MG IJ SOLR
60.0000 mg | Freq: Two times a day (BID) | INTRAMUSCULAR | Status: DC
  Administered 2017-10-29 (×2): 60 mg via INTRAVENOUS
  Filled 2017-10-28 (×2): qty 2

## 2017-10-28 MED ORDER — SODIUM CHLORIDE 0.9% FLUSH: 3.0000 mL | INTRAVENOUS | Status: DC | PRN

## 2017-10-28 MED ORDER — LORAZEPAM 2 MG/ML IJ SOLN: 1.0000 mg | Freq: Four times a day (QID) | INTRAMUSCULAR | Status: DC | PRN

## 2017-10-28 MED ORDER — THIAMINE HCL 100 MG PO TABS: 100.0000 mg | ORAL_TABLET | Freq: Every day | ORAL | Status: DC

## 2017-10-28 MED ORDER — SODIUM CHLORIDE 0.9 % IV SOLN: 250.0000 mL | INTRAVENOUS | Status: DC | PRN

## 2017-10-28 MED ORDER — FAMOTIDINE 20 MG PO TABS
10.0000 mg | ORAL_TABLET | Freq: Every day | ORAL | Status: DC
  Administered 2017-10-29 – 2017-10-30 (×2): 10 mg via ORAL
  Filled 2017-10-28 (×2): qty 1

## 2017-10-28 MED ORDER — LORAZEPAM 2 MG/ML IJ SOLN: 0.0000 mg | Freq: Two times a day (BID) | INTRAMUSCULAR | Status: DC

## 2017-10-28 MED ORDER — FUROSEMIDE 20 MG PO TABS: 40.0000 mg | ORAL_TABLET | Freq: Every day | ORAL | Status: DC

## 2017-10-28 MED ORDER — IPRATROPIUM-ALBUTEROL 0.5-2.5 (3) MG/3ML IN SOLN
3.0000 mL | Freq: Once | RESPIRATORY_TRACT | Status: AC
  Administered 2017-10-29: 3 mL via RESPIRATORY_TRACT
  Filled 2017-10-28: qty 3

## 2017-10-28 MED ORDER — LORAZEPAM 2 MG/ML IJ SOLN
0.0000 mg | Freq: Four times a day (QID) | INTRAMUSCULAR | Status: DC
  Administered 2017-10-30: 2 mg via INTRAVENOUS
  Filled 2017-10-28: qty 2

## 2017-10-28 MED ORDER — OMEGA-3-ACID ETHYL ESTERS 1 G PO CAPS
1.0000 g | ORAL_CAPSULE | Freq: Every day | ORAL | Status: DC
  Administered 2017-10-29 – 2017-11-03 (×6): 1 g via ORAL
  Filled 2017-10-28 (×6): qty 1

## 2017-10-28 MED ORDER — POLYETHYLENE GLYCOL 3350 17 G PO PACK
17.0000 g | PACK | Freq: Every day | ORAL | Status: DC | PRN
  Administered 2017-10-30: 17 g via ORAL
  Filled 2017-10-28: qty 1

## 2017-10-28 NOTE — ED Notes (Addendum)
Ambulated Pt with Pulse Ox in hallway while on O2. Pt started at 98%. Pt ambulated right outside of RM doorway and stated he was feeling too short of breathe and needed to return back to the bed. At that time Pt was at 97% still on 2L O2. Once Pt sat back in the bed Pt stated that the walk was too much and was having more trouble breathing. Pt's respiratory rate went to 38 and Pulse Ox went down to 94%. Pt back in bed on 2L at 96%.

## 2017-10-28 NOTE — H&P (Signed)
History and Physical    Kevin Watkins NLZ:[id number] DOB: 1941/04/26 DOA: 10/28/2017  Referring MD/NP/PA:   PCP: Ronita Hipps, MD   Patient coming from:  The patient is coming from home.  At baseline, pt is independent for most of ADL. S  Chief Complaint: shortness breath  HPI: Kevin Watkins is a 77 y.o. male with medical history significant of hypertension, hyperlipidemia, COPD, GERD, CAD, stent placement, dCHF, alcohol abuse, tobacco abuse, PVD, iron deficiency anemia, atrial fibrillation on Eliquis, CKD-3, who presents with shortness of breath.  Patient states that he has been having shortness of breath in the past 2 months, which has been progressively getting worse. He does not have chest pain, fever or chills. Patient states that he has mild cough, 2 or 3 days each day, but most of time he coughs up dark colored material, which he thinks blood. Patient has mild leg edema. Denies nausea, vomiting, diarrhea, abdominal pain, symptoms of UTI or unilateral weakness. Patient states that he had several episodes of nose bleeding recently, last episode was yesterday. Currently no bleeding. Patient does not have rectal bleeding, hematochezia, hematemesis, hematuria. He states that he stopped smoking currently he is taking Chantix.  ED Course: pt was found to have hemoglobin dropped from 11.5 on 12/23/16-->8.8 on 10/25/17-->7.9 today, WBC 7.0, negative troponin, BNP 427, stable renal function, temperature normal, heart rate in 90s, tachypnea, O2. 95% on 3 L nasal cannula oxygen. Chest x-ray showed interstitial lung disease with bilateral pleural effusion. Patient is placed on telemetry bed for observation.  Review of Systems:   General: no fevers, chills, no body weight gain, has fatigue HEENT: no blurry vision, hearing changes or sore throat Respiratory: has dyspnea, coughing, wheezing CV: no chest pain, no palpitations GI: no nausea, vomiting, abdominal pain, diarrhea, constipation GU: no dysuria,  burning on urination, increased urinary frequency, hematuria  Ext: has leg edema Neuro: no unilateral weakness, numbness, or tingling, no vision change or hearing loss Skin: no rash, no skin tear. MSK: No muscle spasm, no deformity, no limitation of range of movement in spin Heme: No easy bruising.  Travel history: No recent long distant travel.  Allergy: No Known Allergies  Past Medical History:  Diagnosis Date  . CAD in native artery 06/2000   BMS to the RCA; known 60% mid dominant RCA stenosis and 50-60% OM 2 branch stenosis normal LV function. Last Myoview May 2011 negative for ischemia.  . CHF (congestive heart failure) ([place])   . COPD (chronic obstructive pulmonary disease) ([place])   . Dyslipidemia, goal LDL below 70   . Edema   . EtOH dependence ([place])    Family reported  . Hypertension   . Oxygen deficiency   . PAD (peripheral artery disease) (HCC)    iliac, SCA, Innominate, and renal PTA  . Tobacco abuse     Past Surgical History:  Procedure Laterality Date  . ANGIOPLASTY  '01, '03   innominate artery PTA  . Carotid Doppler  2013   R vertebral appears occluded, R subclavian with prox occlusion v. high grade stenosis; R bulb 70-99% diameter reduction; R prox ICA 50-69% diameter reduction; L bulb & prox ICA 50-69% diameter reduction; L subclavian with >50% diameter reduction  . CORONARY ANGIOPLASTY WITH STENT PLACEMENT  06/26/2000    BMS to RCA; residual 60%mid dominant RCA stent, 50-60% OM2 stenosis (Dr. Adora Fridge)  . NM MYOCAR PERF WALL MOTION  2011   persantine myoview - normal perfusion in all regions, EF  64%  . RENAL ANGIOGRAM  09/22/2005   stent to right renal artery with 5x12 Genesis on Aviator stent balloon pre-mount (Dr. Adora Fridge)  . Renal Doppler  2013   R prox renal artery stent with 60-99% in-stent restenosis; L prox renal artery =/> 60% diameter reduction; R & L kidneys normal in size  . SUBCLAVIAN ANGIOGRAM  07/15/2004   stent to right subclavian (7x18 Genesis)  and innominate (8x24 Genesis), with known ostial left common carotid stenosis and left subclavian stenosis as well (Dr. Adora Fridge)  . TRANSTHORACIC ECHOCARDIOGRAM  2013   EF=>55%, mild conc LVH; LA mod dilated; RA mild-mod dilated; mild mitral annular calcif, mild MR; mild TR with normal RSVP  . Upper Extremity Arterial Doppler  2013   R prox subclavian artery stent with prox occlusion v. high grade stenosis; R vertebral appears occluded; L subclavian with >60% diameter reduction    Social History:  reports that he quit smoking about 10 months ago. His smoking use included cigarettes. He smoked 1.00 pack per day. He has never used smokeless tobacco. He reports that he drinks about 12.6 oz of alcohol per week. He reports that he does not use drugs.  Family History:  Family History  Problem Relation Age of Onset  . Heart disease Mother        hx CABG  . Heart disease Father        hx of CABG  . Cancer Sister   . Heart attack Brother   . Heart disease Brother      Prior to Admission medications   Medication Sig Start Date End Date Taking? Authorizing Provider  apixaban (ELIQUIS) 5 MG TABS tablet Take 1 tablet (5 mg total) by mouth 2 (two) times daily. 06/05/17  Yes Lorretta Harp, MD  aspirin EC 81 MG tablet Take 81 mg by mouth daily.   Yes [provider]  atorvastatin (LIPITOR) 40 MG tablet Take 40 mg by mouth daily at 6 PM.  12/26/12  Yes [provider]  B Complex-Biotin-FA (VITAMIN B50 COMPLEX PO) Take 1 tablet by mouth daily.   Yes [provider]  diphenhydrAMINE (BENADRYL) 25 MG tablet Take 25-50 mg by mouth See admin instructions. Take two capsules (50 mg) by mouth daily at bedtime, may also take one or two capsule (25-50 mg) during the night as needed for sleep   Yes [provider]  ferrous sulfate 325 (65 FE) MG tablet Take 1 tablet (325 mg total) by mouth 2 (two) times daily with a meal. 10/25/17  Yes Sherwood Gambler, MD  folic acid (FOLVITE)  809 MCG tablet Take 800 mcg by mouth daily.   Yes [provider]  furosemide (LASIX) 40 MG tablet Take 1 tablet by mouth daily. May take extra 40 mg daily in afternoon for weight gain of 3lbs Patient taking differently: Take 40 mg by mouth See admin instructions. Take one tablet (40 mg) by mouth every morning,  Take an extra tablet (40 mg) with lunch as needed for leg swelling or weight gain of 2-3 lbs overnight 06/06/17  Yes Lorretta Harp, MD  Garlic 9833 MG CAPS Take 1,000 mg by mouth daily.    Yes [provider]  guaifenesin (ROBITUSSIN) 100 MG/5ML syrup Take by mouth every 4 (four) hours as needed for cough.   Yes [provider]  ipratropium-albuterol (DUONEB) 0.5-2.5 (3) MG/3ML SOLN Take 3 mLs by nebulization 4 (four) times daily.    Yes [provider]  Lactobacillus (PROBIOTIC ACIDOPHILUS PO) Take 1 tablet by mouth daily.   Yes [provider]  metoprolol tartrate (LOPRESSOR) 25 MG tablet Take 12.5 mg by mouth 2 (two) times daily.  08/16/17  Yes [provider]  Omega-3 Fatty Acids (FISH OIL) 1200 MG CAPS Take 1,200 mg by mouth 2 (two) times daily.   Yes [provider]  OVER THE COUNTER MEDICATION Place 2 drops under the tongue 2 (two) times daily. CBD tincture/hemp flower extract   Yes [provider]  OXYGEN Inhale 2 L into the lungs continuous.   Yes [provider]  polyethylene glycol (MIRALAX / GLYCOLAX) packet Take by mouth See admin instructions. Mix 3 teaspoonsful (15 mls) in water and drink every evening   Yes [provider]  potassium chloride SA (K-DUR,KLOR-CON) 20 MEQ tablet Take 1 tablet (20 mEq total) by mouth daily. 10/25/17  Yes Sherwood Gambler, MD  PROAIR HFA 108 [phone] Base) MCG/ACT inhaler Inhale 2 puffs into the lungs every 6 (six) hours as needed for wheezing or shortness of breath. 12/24/16  Yes Alphonzo Grieve, MD  ranitidine (ZANTAC) 75 MG tablet Take 75 mg by mouth daily.   Yes  [provider]  thiamine 100 MG tablet Take 1 tablet (100 mg total) by mouth daily. 12/28/16  Yes Lorretta Harp, MD  varenicline (CHANTIX PAK) 0.5 MG X 11 & 1 MG X 42 tablet Take 0.5 mg by mouth See admin instructions. Take one tablet (0.5 mg)  by mouth once daily for 3 days, then increase to one tablet (0.5 mg)  twice daily for 4 days, then increase to  one tablet (1 mg) twice daily for 21 days   Yes [provider]  vitamin E (VITAMIN E) 400 UNIT capsule Take 400 Units by mouth daily.   Yes [provider]  Wheat Dextrin (BENEFIBER PO) Take 20 mLs by mouth daily. Mix 4 teaspoon (20 ml) in water and drink daily   Yes [provider]  guaiFENesin-dextromethorphan (ROBITUSSIN DM) 100-10 MG/5ML syrup Take 5 mLs by mouth every 4 (four) hours as needed for cough. Patient not taking: Reported on 10/28/2017 12/24/16   Alphonzo Grieve, MD    Physical Exam: Vitals:   10/28/17 1851  BP: 111/69  Pulse: 90  Resp: (!) 24  Temp: 97.7 F (36.5 C)  TempSrc: Oral  SpO2: 95%  Weight: 88.9 kg (196 lb)  Height: 5\' 7"  (1.702 m)   General: Not in acute distress HEENT:       Eyes: PERRL, EOMI, no scleral icterus.       ENT: No discharge from the ears and nose, no pharynx injection, no tonsillar enlargement.        Neck: positive JVD, no bruit, no mass felt. Heme: No neck lymph node enlargement. Cardiac: S1/S2, irregularly irregular rhythm, No murmurs, No gallops or rubs. Respiratory: has mild wheezing bilaterally (R is worse than the left). GI: Soft, nondistended, nontender, no rebound pain, no organomegaly, BS present. GU: No hematuria Ext: 1+ pitting leg edema bilaterally. 2+DP/PT pulse bilaterally. Musculoskeletal: No joint deformities, No joint redness or warmth, no limitation of ROM in spin. Skin: No rashes.  Neuro: Alert, oriented X3, cranial nerves II-XII grossly intact, moves all extremities normally.  Psych: Patient is not psychotic, no suicidal or  hemocidal ideation.  Labs on Admission: I have personally reviewed following labs and imaging studies  CBC: Recent Labs  Lab 10/25/17 1114 10/25/17 1609 10/28/17 1916  WBC 5.7 5.8 7.0  HGB  8.5* 8.8* 7.9*  HCT 26.9* 27.7* 24.5*  MCV 85.4 85.5 86.9  PLT 137* 133* 993   Basic Metabolic Panel: Recent Labs  Lab 10/25/17 1114 10/28/17 1916  NA 135 137  K 3.4* 4.2  CL 98* 101  CO2 27 25  GLUCOSE 115* 120*  BUN 18 27*  CREATININE 1.63* 1.53*  CALCIUM 8.9 9.2   GFR: Estimated Creatinine Clearance: 42.3 mL/min (A) (by C-G formula based on SCr of 1.53 mg/dL (H)). Liver Function Tests: No results for input(s): AST, ALT, ALKPHOS, BILITOT, PROT, ALBUMIN in the last 168 hours. No results for input(s): LIPASE, AMYLASE in the last 168 hours. No results for input(s): AMMONIA in the last 168 hours. Coagulation Profile: No results for input(s): INR, PROTIME in the last 168 hours. Cardiac Enzymes: No results for input(s): CKTOTAL, CKMB, CKMBINDEX, TROPONINI in the last 168 hours. BNP (last 3 results) No results for input(s): PROBNP in the last 8760 hours. HbA1C: No results for input(s): HGBA1C in the last 72 hours. CBG: No results for input(s): GLUCAP in the last 168 hours. Lipid Profile: No results for input(s): CHOL, HDL, LDLCALC, TRIG, CHOLHDL, LDLDIRECT in the last 72 hours. Thyroid Function Tests: No results for input(s): TSH, T4TOTAL, FREET4, T3FREE, THYROIDAB in the last 72 hours. Anemia Panel: No results for input(s): VITAMINB12, FOLATE, FERRITIN, TIBC, IRON, RETICCTPCT in the last 72 hours. Urine analysis: No results found for: COLORURINE, APPEARANCEUR, LABSPEC, PHURINE, GLUCOSEU, HGBUR, BILIRUBINUR, KETONESUR, PROTEINUR, UROBILINOGEN, NITRITE, LEUKOCYTESUR Sepsis Labs: @LABRCNTIP (procalcitonin:4,lacticidven:4) )No results found for this or any previous visit (from the past 240 hour(s)).   Radiological Exams on Admission: Dg Chest 2 View  Result Date:  10/28/2017 CLINICAL DATA:  Cough and SOB x2 months. Hx of COPD, CAD, CHF, HTN. Smoker. EXAM: CHEST - 2 VIEW COMPARISON:  10/25/2016 FINDINGS: Heart size is accentuated by technique. There is diffuse prominence of interstitial markings with a basilar predominance. Small bilateral pleural effusions are again noted, possibly slightly increased. There is minimal bibasilar atelectasis. IMPRESSION: 1. Chronic interstitial lung disease. 2. Increasing bilateral pleural effusions and minimal bibasilar atelectasis. Electronically Signed   By: Nolon Nations M.D.   On: 10/28/2017 19:42     EKG: Independently reviewed.  Atrial fibrillation, QTC 455, nonspecific T-wave change.   Assessment/Plan Principal Problem:   Acute on chronic respiratory failure with hypoxia (HCC) Active Problems:   CAD-RCA BMS 2001, low risk Myoview 2011   Tobacco abuse   Dyslipidemia, goal LDL below 70   Essential hypertension   Acute on chronic diastolic CHF (congestive heart failure) (HCC)   Paroxysmal atrial fibrillation (HCC)   Chronic anticoagulation   COPD with acute exacerbation (HCC)   CKD (chronic kidney disease), stage III (HCC)   Iron deficiency anemia   Acute on chronic respiratory failure with hypoxia ([place]): likely multifactorial etiology including CHF exacerbation, COPD exacerbation and worsening anemia. Patient's weight has been stable per patient, but he has bilateral leg edema, positive JVD and elevated BNP 427, consistent with CHF exacerbation. He has wheezing on auscultation, indicating COPD exacerbation. Patient also has worsening anemia, hemoglobin dropped from 11.5 on 12/23/16 to 7.9 today.   - will place on tele bed for obs --Nasal cannula oxygen as needed to maintain O2 saturation 92% or greater - treat COPD and CHF exacerbation as below  COPD excerebation: Mild. Has wheezing, no productive cough. Chest x-ray has no infiltration. Noindication for antibiotics. -Nebulizers: scheduled Duoneb and prn  albuterol -Solu-Medrol 60 mg IV bid -Mucinex for cough  -Incentive spirometry  Acute on chronic diastolic CHF: No CP -lasix 40 mg daily -f/u 2d echo -continue ASA and metoprolol  Iron deficiency anemia: patient has a worsening anemia. Hemoglobin dropped from 11.5-17.9. No rectal bleeding. likely due to epistaxes. Patient also reports coughing up dark red material risks, which may be due to hemolysis. - Hold Eliquis - INR/PTT/type & screen -chech cbc q6h-->will transfuse blood if Hgb<7.0 or if pt's symptoms do not improve with IV Lasix and bronchodilator treatment. -check FOBT -continue iron supplement -Anemia panel  Hx of CAD-RCA BMS 2001, low risk Myoview 2011: -continue ASA, and Lipitor and metoprolol  Tobacco abuse and Alcohol abuse: he stopped smoking currently taking Chantix -Did counseling about the importance of quitting drinking -CIWA protocol -chantix  HLD: -lipitor  HTN:  -Continue home medications: Metoprolol -IV hydralazine prn  Paroxysmal atrial fibrillation ([place]): CHA2DS2-VASc Score is 5, needs oral anticoagulation. Patient is on Coumadin Eliquis at home. Heart rate is well controlled. Now has worsening anemia, epistaxis and possible hemoptysis -Hold Eliquis -Continue metoprolol  CKD (chronic kidney disease), stage III ([place]): stable. Baseline creatinine 1.5-1.7. His creatinine 1.53, BUN 27. Follow-up renal function. BMP  DVT ppx:  SQ Lovenox Code Status: Full code Family Communication:  Yes, patient's daughter and wife  at bed side Disposition Plan:  Anticipate discharge back to previous home environment Consults called:  none Admission status: Obs / tele     Date of Service 10/29/2017    Ivor Costa [facility] Pager [phone]  If 7PM-7AM, please contact night-coverage [email or web address] Password [email or web address] 10/29/2017, 12:02 AM

## 2017-10-28 NOTE — ED Provider Notes (Signed)
[facility] EMERGENCY DEPARTMENT Provider Note   CSN: [id number] Arrival date & time: 10/28/17  1845     History   Chief Complaint Chief Complaint  Patient presents with  . Shortness of Breath    HPI Kevin Watkins is a 77 y.o. male.  HPI   Patient is a 77 year old male presenting with shortness of breath.  Patient has a meticulous account of weight and blood pressures over the last several months.  Patient has known CHF, is on 40 mg of Lasix daily.  Patient reports that he doubles his dose when he gains more than 2-3 pounds on his daily weight.  Although patient has not gained any weight, he does note increasing edema in bilateral lower extremities.  Over the last several weeks.  Has noted that he is been unable to walk even from his chair to the door without becoming short of breath    Past Medical History:  Diagnosis Date  . CAD in native artery 06/2000   BMS to the RCA; known 60% mid dominant RCA stenosis and 50-60% OM 2 branch stenosis normal LV function. Last Myoview May 2011 negative for ischemia.  . CHF (congestive heart failure) ([place])   . COPD (chronic obstructive pulmonary disease) ([place])   . Dyslipidemia, goal LDL below 70   . Edema   . EtOH dependence ([place])    Family reported  . Hypertension   . Oxygen deficiency   . PAD (peripheral artery disease) (HCC)    iliac, SCA, Innominate, and renal PTA  . Tobacco abuse     Patient Active Problem List   Diagnosis Date Noted  . Normocytic anemia 10/28/2017  . CKD (chronic kidney disease), stage III ([place]) 10/28/2017  . Iron deficiency anemia 10/28/2017  . CAP (community acquired pneumonia) 08/01/2017  . Chronic respiratory failure with hypoxia ([place]) 01/17/2017  . Pulmonary edema   . Acute respiratory failure with hypoxia and hypercarbia ([place]) 12/19/2016  . COPD (chronic obstructive pulmonary disease) ([place])   . Dyspnea 12/17/2016  . Acute respiratory failure with hypoxia ([place]) 06/23/2016  .  Obesity 06/23/2016  . Chronic anticoagulation 06/23/2016  . Paroxysmal atrial fibrillation ([place]) 05/30/2016  . Bilateral carotid artery disease ([place]) 10/30/2015  . Epistaxis 04/30/2015  . Chronic diastolic heart failure ([place]) 06/03/2014  . Acute on chronic diastolic CHF (congestive heart failure) ([place]) 10/30/2013  . Bilateral leg edema 02/15/2013  . CAD-RCA BMS 2001, low risk Myoview 2011   . PAD- multiple interventions- renal, iliac, SCA   . Tobacco abuse   . Dyslipidemia, goal LDL below 70   . Essential hypertension     Past Surgical History:  Procedure Laterality Date  . ANGIOPLASTY  '01, '03   innominate artery PTA  . Carotid Doppler  2013   R vertebral appears occluded, R subclavian with prox occlusion v. high grade stenosis; R bulb 70-99% diameter reduction; R prox ICA 50-69% diameter reduction; L bulb & prox ICA 50-69% diameter reduction; L subclavian with >50% diameter reduction  . CORONARY ANGIOPLASTY WITH STENT PLACEMENT  06/26/2000    BMS to RCA; residual 60%mid dominant RCA stent, 50-60% OM2 stenosis (Dr. Adora Fridge)  . NM MYOCAR PERF WALL MOTION  2011   persantine myoview - normal perfusion in all regions, EF 64%  . RENAL ANGIOGRAM  09/22/2005   stent to right renal artery with 5x12 Genesis on Aviator stent balloon pre-mount (Dr. Adora Fridge)  . Renal Doppler  2013   R prox renal artery stent  with 60-99% in-stent restenosis; L prox renal artery =/> 60% diameter reduction; R & L kidneys normal in size  . SUBCLAVIAN ANGIOGRAM  07/15/2004   stent to right subclavian (7x18 Genesis) and innominate (8x24 Genesis), with known ostial left common carotid stenosis and left subclavian stenosis as well (Dr. Adora Fridge)  . TRANSTHORACIC ECHOCARDIOGRAM  2013   EF=>55%, mild conc LVH; LA mod dilated; RA mild-mod dilated; mild mitral annular calcif, mild MR; mild TR with normal RSVP  . Upper Extremity Arterial Doppler  2013   R prox subclavian artery stent with prox occlusion v. high grade  stenosis; R vertebral appears occluded; L subclavian with >60% diameter reduction        Home Medications    Prior to Admission medications   Medication Sig Start Date End Date Taking? Authorizing Provider  apixaban (ELIQUIS) 5 MG TABS tablet Take 1 tablet (5 mg total) by mouth 2 (two) times daily. 06/05/17  Yes Lorretta Harp, MD  aspirin EC 81 MG tablet Take 81 mg by mouth daily.   Yes [provider]  atorvastatin (LIPITOR) 40 MG tablet Take 40 mg by mouth daily at 6 PM.  12/26/12  Yes [provider]  B Complex-Biotin-FA (VITAMIN B50 COMPLEX PO) Take 1 tablet by mouth daily.   Yes [provider]  diphenhydrAMINE (BENADRYL) 25 MG tablet Take 25-50 mg by mouth See admin instructions. Take two capsules (50 mg) by mouth daily at bedtime, may also take one or two capsule (25-50 mg) during the night as needed for sleep   Yes [provider]  ferrous sulfate 325 (65 FE) MG tablet Take 1 tablet (325 mg total) by mouth 2 (two) times daily with a meal. 10/25/17  Yes Sherwood Gambler, MD  folic acid (FOLVITE) 235 MCG tablet Take 800 mcg by mouth daily.   Yes [provider]  furosemide (LASIX) 40 MG tablet Take 1 tablet by mouth daily. May take extra 40 mg daily in afternoon for weight gain of 3lbs Patient taking differently: Take 40 mg by mouth See admin instructions. Take one tablet (40 mg) by mouth every morning,  Take an extra tablet (40 mg) with lunch as needed for leg swelling or weight gain of 2-3 lbs overnight 06/06/17  Yes Lorretta Harp, MD  Garlic 5732 MG CAPS Take 1,000 mg by mouth daily.    Yes [provider]  guaifenesin (ROBITUSSIN) 100 MG/5ML syrup Take by mouth every 4 (four) hours as needed for cough.   Yes [provider]  ipratropium-albuterol (DUONEB) 0.5-2.5 (3) MG/3ML SOLN Take 3 mLs by nebulization 4 (four) times daily.    Yes [provider]  Lactobacillus (PROBIOTIC ACIDOPHILUS PO) Take 1 tablet by  mouth daily.   Yes [provider]  metoprolol tartrate (LOPRESSOR) 25 MG tablet Take 12.5 mg by mouth 2 (two) times daily.  08/16/17  Yes [provider]  Omega-3 Fatty Acids (FISH OIL) 1200 MG CAPS Take 1,200 mg by mouth 2 (two) times daily.   Yes [provider]  OVER THE COUNTER MEDICATION Place 2 drops under the tongue 2 (two) times daily. CBD tincture/hemp flower extract   Yes [provider]  OXYGEN Inhale 2 L into the lungs continuous.   Yes [provider]  polyethylene glycol (MIRALAX / GLYCOLAX) packet Take by mouth See admin instructions. Mix 3 teaspoonsful (15 mls) in water and drink every evening   Yes [provider]  potassium chloride SA (K-DUR,KLOR-CON) 20 MEQ  tablet Take 1 tablet (20 mEq total) by mouth daily. 10/25/17  Yes Sherwood Gambler, MD  PROAIR HFA 108 [phone] Base) MCG/ACT inhaler Inhale 2 puffs into the lungs every 6 (six) hours as needed for wheezing or shortness of breath. 12/24/16  Yes Alphonzo Grieve, MD  ranitidine (ZANTAC) 75 MG tablet Take 75 mg by mouth daily.   Yes [provider]  thiamine 100 MG tablet Take 1 tablet (100 mg total) by mouth daily. 12/28/16  Yes Lorretta Harp, MD  varenicline (CHANTIX PAK) 0.5 MG X 11 & 1 MG X 42 tablet Take 0.5 mg by mouth See admin instructions. Take one tablet (0.5 mg)  by mouth once daily for 3 days, then increase to one tablet (0.5 mg)  twice daily for 4 days, then increase to  one tablet (1 mg) twice daily for 21 days   Yes [provider]  vitamin E (VITAMIN E) 400 UNIT capsule Take 400 Units by mouth daily.   Yes [provider]  Wheat Dextrin (BENEFIBER PO) Take 20 mLs by mouth daily. Mix 4 teaspoon (20 ml) in water and drink daily   Yes [provider]  guaiFENesin-dextromethorphan (ROBITUSSIN DM) 100-10 MG/5ML syrup Take 5 mLs by mouth every 4 (four) hours as needed for cough. Patient not taking: Reported on 10/28/2017 12/24/16   Alphonzo Grieve, MD    Family History Family History  Problem Relation Age of Onset  . Heart disease Mother        hx CABG  . Heart disease Father        hx of CABG  . Cancer Sister   . Heart attack Brother   . Heart disease Brother     Social History Social History   Tobacco Use  . Smoking status: Former Smoker    Packs/day: 1.00    Types: Cigarettes    Last attempt to quit: 12/17/2016    Years since quitting: 0.8  . Smokeless tobacco: Never Used  . Tobacco comment: pt states he is currently smoking 3cigs per day as of 10/12/17  Substance Use Topics  . Alcohol use: Yes    Alcohol/week: 12.6 oz    Types: 21 Cans of beer per week    Comment: beer  . Drug use: No     Allergies   Patient has no known allergies.   Review of Systems Review of Systems  Constitutional: Positive for fatigue. Negative for activity change and fever.  Respiratory: Positive for chest tightness and shortness of breath. Negative for wheezing.   Cardiovascular: Negative for chest pain.  Gastrointestinal: Negative for abdominal pain.  All other systems reviewed and are negative.    Physical Exam Updated Vital Signs BP 111/69   Pulse 90   Temp 97.7 F (36.5 C) (Oral)   Resp (!) 24   Ht 5\' 7"  (1.702 m)   Wt 88.9 kg (196 lb)   SpO2 95%   BMI 30.70 kg/m   Physical Exam  Constitutional: He is oriented to person, place, and time. He appears well-nourished.  HENT:  Head: Normocephalic.  Eyes: Pupils are equal, round, and reactive to light. Conjunctivae and EOM are normal.  Cardiovascular: Normal rate and regular rhythm.  Pulmonary/Chest: He has decreased breath sounds.  Tachypnea, difficulty with full sentences, on 2 L.  Scattered faint wheezes  Abdominal: Soft. There is no tenderness.  Musculoskeletal:       Right lower leg: He exhibits edema.       Left lower  leg: He exhibits edema. He exhibits no tenderness.  Pitting edema to knee  Neurological: He is oriented to person, place, and time.    Skin: Skin is warm and dry. He is not diaphoretic.  Psychiatric: He has a normal mood and affect. His behavior is normal.  Nursing note and vitals reviewed.    ED Treatments / Results  Labs (all labs ordered are listed, but only abnormal results are displayed) Labs Reviewed  BASIC METABOLIC PANEL - Abnormal; Notable for the following components:      Result Value   Glucose, Bld 120 (*)    BUN 27 (*)    Creatinine, Ser 1.53 (*)    GFR calc non Af Amer 42 (*)    GFR calc Af Amer 48 (*)    All other components within normal limits  CBC - Abnormal; Notable for the following components:   RBC 2.82 (*)    Hemoglobin 7.9 (*)    HCT 24.5 (*)    RDW 19.1 (*)    All other components within normal limits  BRAIN NATRIURETIC PEPTIDE - Abnormal; Notable for the following components:   B Natriuretic Peptide 427.9 (*)    All other components within normal limits  VITAMIN B12  FOLATE  IRON AND TIBC  FERRITIN  RETICULOCYTES  I-STAT TROPONIN, ED  TYPE AND SCREEN  ABO/RH    EKG EKG Interpretation  Date/Time:  Saturday October 28 2017 18:52:09 EDT Ventricular Rate:  93 PR Interval:    QRS Duration: 92 QT Interval:  366 QTC Calculation: 455 R Axis:   71 Text Interpretation:  Atrial fibrillation Confirmed by Zenovia Jarred [phone]) on 10/28/2017 10:30:14 PM   Radiology Dg Chest 2 View  Result Date: 10/28/2017 CLINICAL DATA:  Cough and SOB x2 months. Hx of COPD, CAD, CHF, HTN. Smoker. EXAM: CHEST - 2 VIEW COMPARISON:  10/25/2016 FINDINGS: Heart size is accentuated by technique. There is diffuse prominence of interstitial markings with a basilar predominance. Small bilateral pleural effusions are again noted, possibly slightly increased. There is minimal bibasilar atelectasis. IMPRESSION: 1. Chronic interstitial lung disease. 2. Increasing bilateral pleural effusions and minimal bibasilar atelectasis. Electronically Signed   By: Nolon Nations M.D.   On: 10/28/2017 19:42     Procedures Procedures (including critical care time)  Medications Ordered in ED Medications  ipratropium-albuterol (DUONEB) 0.5-2.5 (3) MG/3ML nebulizer solution 3 mL (has no administration in time range)  predniSONE (DELTASONE) tablet 40 mg (has no administration in time range)  furosemide (LASIX) injection 40 mg (40 mg Intravenous Given 10/28/17 2232)     Initial Impression / Assessment and Plan / ED Course  I have reviewed the triage vital signs and the nursing notes.  Pertinent labs & imaging results that were available during my care of the patient were reviewed by me and considered in my medical decision making (see chart for details).     Patient is a 77 year old male presenting with shortness of breath.  Patient has a meticulous account of weight and blood pressures over the last several months.  Patient has known CHF, is on 40 mg of Lasix daily.  Patient reports that he doubles his dose when he gains more than 2-3 pounds on his daily weight.  Although patient has not gained any weight, he does note increasing edema in bilateral lower extremities.  Over the last several weeks.  Has noted that he is been unable to walk even from his chair to the door without becoming short of breath  10:28 PM Patient appears short of breath even when speaking sentences.  Patient has +2 pitting edema bilateral lower extremities.  Will need to admit for IV diuresis, echo.  I think this is likely worsening heart failure.  Bilateral pleural effusions noted on x-ray.  The shortness of breath could also be from his anemia.  Patient was seen relatively recently with low hemoglobin levels.  Patient's hemoglobin today 7.9. 1 year ago was 11.  Patient did have a hemoglobin of 8.8 when he was seen here several weeks ago, did have one episode of epistaxis which required intervention at [facility].  Final Clinical Impressions(s) / ED Diagnoses   Final diagnoses:  None    ED Discharge Orders     None       Macarthur Critchley, MD 10/28/17 [phone]

## 2017-10-29 ENCOUNTER — Other Ambulatory Visit: Payer: Self-pay

## 2017-10-29 ENCOUNTER — Encounter (HOSPITAL_COMMUNITY): Payer: Self-pay

## 2017-10-29 ENCOUNTER — Observation Stay (HOSPITAL_BASED_OUTPATIENT_CLINIC_OR_DEPARTMENT_OTHER): Payer: PPO

## 2017-10-29 DIAGNOSIS — I5033 Acute on chronic diastolic (congestive) heart failure: Secondary | ICD-10-CM | POA: Diagnosis present

## 2017-10-29 DIAGNOSIS — I481 Persistent atrial fibrillation: Secondary | ICD-10-CM | POA: Diagnosis present

## 2017-10-29 DIAGNOSIS — Z955 Presence of coronary angioplasty implant and graft: Secondary | ICD-10-CM | POA: Diagnosis not present

## 2017-10-29 DIAGNOSIS — Z87891 Personal history of nicotine dependence: Secondary | ICD-10-CM | POA: Diagnosis not present

## 2017-10-29 DIAGNOSIS — I509 Heart failure, unspecified: Secondary | ICD-10-CM

## 2017-10-29 DIAGNOSIS — R195 Other fecal abnormalities: Secondary | ICD-10-CM | POA: Diagnosis not present

## 2017-10-29 DIAGNOSIS — D509 Iron deficiency anemia, unspecified: Secondary | ICD-10-CM | POA: Diagnosis not present

## 2017-10-29 DIAGNOSIS — I739 Peripheral vascular disease, unspecified: Secondary | ICD-10-CM | POA: Diagnosis present

## 2017-10-29 DIAGNOSIS — Z7982 Long term (current) use of aspirin: Secondary | ICD-10-CM | POA: Diagnosis not present

## 2017-10-29 DIAGNOSIS — Z7901 Long term (current) use of anticoagulants: Secondary | ICD-10-CM | POA: Diagnosis not present

## 2017-10-29 DIAGNOSIS — D500 Iron deficiency anemia secondary to blood loss (chronic): Secondary | ICD-10-CM | POA: Diagnosis present

## 2017-10-29 DIAGNOSIS — E785 Hyperlipidemia, unspecified: Secondary | ICD-10-CM | POA: Diagnosis present

## 2017-10-29 DIAGNOSIS — R0602 Shortness of breath: Secondary | ICD-10-CM | POA: Diagnosis present

## 2017-10-29 DIAGNOSIS — I130 Hypertensive heart and chronic kidney disease with heart failure and stage 1 through stage 4 chronic kidney disease, or unspecified chronic kidney disease: Secondary | ICD-10-CM | POA: Diagnosis present

## 2017-10-29 DIAGNOSIS — K219 Gastro-esophageal reflux disease without esophagitis: Secondary | ICD-10-CM | POA: Diagnosis present

## 2017-10-29 DIAGNOSIS — D123 Benign neoplasm of transverse colon: Secondary | ICD-10-CM | POA: Diagnosis not present

## 2017-10-29 DIAGNOSIS — I482 Chronic atrial fibrillation: Secondary | ICD-10-CM | POA: Diagnosis present

## 2017-10-29 DIAGNOSIS — Z9981 Dependence on supplemental oxygen: Secondary | ICD-10-CM | POA: Diagnosis not present

## 2017-10-29 DIAGNOSIS — J441 Chronic obstructive pulmonary disease with (acute) exacerbation: Secondary | ICD-10-CM | POA: Diagnosis present

## 2017-10-29 DIAGNOSIS — D649 Anemia, unspecified: Secondary | ICD-10-CM | POA: Diagnosis not present

## 2017-10-29 DIAGNOSIS — N183 Chronic kidney disease, stage 3 (moderate): Secondary | ICD-10-CM | POA: Diagnosis present

## 2017-10-29 DIAGNOSIS — I2510 Atherosclerotic heart disease of native coronary artery without angina pectoris: Secondary | ICD-10-CM | POA: Diagnosis present

## 2017-10-29 DIAGNOSIS — D638 Anemia in other chronic diseases classified elsewhere: Secondary | ICD-10-CM | POA: Diagnosis present

## 2017-10-29 DIAGNOSIS — Z79899 Other long term (current) drug therapy: Secondary | ICD-10-CM | POA: Diagnosis not present

## 2017-10-29 DIAGNOSIS — R040 Epistaxis: Secondary | ICD-10-CM | POA: Diagnosis present

## 2017-10-29 DIAGNOSIS — R296 Repeated falls: Secondary | ICD-10-CM | POA: Diagnosis present

## 2017-10-29 DIAGNOSIS — I480 Paroxysmal atrial fibrillation: Secondary | ICD-10-CM | POA: Diagnosis present

## 2017-10-29 DIAGNOSIS — J9621 Acute and chronic respiratory failure with hypoxia: Secondary | ICD-10-CM | POA: Diagnosis present

## 2017-10-29 DIAGNOSIS — F1010 Alcohol abuse, uncomplicated: Secondary | ICD-10-CM | POA: Diagnosis present

## 2017-10-29 DIAGNOSIS — K5730 Diverticulosis of large intestine without perforation or abscess without bleeding: Secondary | ICD-10-CM | POA: Diagnosis present

## 2017-10-29 DIAGNOSIS — L8990 Pressure ulcer of unspecified site, unspecified stage: Secondary | ICD-10-CM

## 2017-10-29 LAB — RETICULOCYTES
RBC.: 2.86 MIL/uL — ABNORMAL LOW (ref 4.22–5.81)
RETIC COUNT ABSOLUTE: 105.8 10*3/uL (ref 19.0–186.0)
Retic Ct Pct: 3.7 % — ABNORMAL HIGH (ref 0.4–3.1)

## 2017-10-29 LAB — CBC
HCT: 24.7 % — ABNORMAL LOW (ref 39.0–52.0)
HEMATOCRIT: 24.5 % — AB (ref 39.0–52.0)
HEMATOCRIT: 25.3 % — AB (ref 39.0–52.0)
HEMOGLOBIN: 7.8 g/dL — AB (ref 13.0–17.0)
HEMOGLOBIN: 8.1 g/dL — AB (ref 13.0–17.0)
Hemoglobin: 7.8 g/dL — ABNORMAL LOW (ref 13.0–17.0)
MCH: 27.5 pg (ref 26.0–34.0)
MCH: 27.6 pg (ref 26.0–34.0)
MCH: 27.7 pg (ref 26.0–34.0)
MCHC: 31.6 g/dL (ref 30.0–36.0)
MCHC: 31.8 g/dL (ref 30.0–36.0)
MCHC: 32 g/dL (ref 30.0–36.0)
MCV: 86.6 fL (ref 78.0–100.0)
MCV: 86.6 fL (ref 78.0–100.0)
MCV: 87 fL (ref 78.0–100.0)
Platelets: 154 10*3/uL (ref 150–400)
Platelets: 160 10*3/uL (ref 150–400)
Platelets: 170 10*3/uL (ref 150–400)
RBC: 2.83 MIL/uL — AB (ref 4.22–5.81)
RBC: 2.84 MIL/uL — ABNORMAL LOW (ref 4.22–5.81)
RBC: 2.92 MIL/uL — AB (ref 4.22–5.81)
RDW: 19.1 % — AB (ref 11.5–15.5)
RDW: 19.1 % — ABNORMAL HIGH (ref 11.5–15.5)
RDW: 19.2 % — AB (ref 11.5–15.5)
WBC: 7 10*3/uL (ref 4.0–10.5)
WBC: 7 10*3/uL (ref 4.0–10.5)
WBC: 7.1 10*3/uL (ref 4.0–10.5)

## 2017-10-29 LAB — BASIC METABOLIC PANEL
Anion gap: 11 (ref 5–15)
BUN: 24 mg/dL — ABNORMAL HIGH (ref 6–20)
CHLORIDE: 102 mmol/L (ref 101–111)
CO2: 24 mmol/L (ref 22–32)
CREATININE: 1.53 mg/dL — AB (ref 0.61–1.24)
Calcium: 9 mg/dL (ref 8.9–10.3)
GFR calc non Af Amer: 42 mL/min — ABNORMAL LOW (ref >60)
GFR, EST AFRICAN AMERICAN: 48 mL/min — AB (ref >60)
GLUCOSE: 134 mg/dL — AB (ref 65–99)
Potassium: 4 mmol/L (ref 3.5–5.1)
Sodium: 137 mmol/L (ref 135–145)

## 2017-10-29 LAB — PROTIME-INR
INR: 1.58
Prothrombin Time: 18.8 seconds — ABNORMAL HIGH (ref 11.4–15.2)

## 2017-10-29 LAB — VITAMIN B12: VITAMIN B 12: 483 pg/mL (ref 180–914)

## 2017-10-29 LAB — OCCULT BLOOD X 1 CARD TO LAB, STOOL: Fecal Occult Bld: POSITIVE — AB

## 2017-10-29 LAB — IRON AND TIBC
IRON: 41 ug/dL — AB (ref 45–182)
Saturation Ratios: 12 % — ABNORMAL LOW (ref 17.9–39.5)
TIBC: 347 ug/dL (ref 250–450)
UIBC: 306 ug/dL

## 2017-10-29 LAB — FOLATE: FOLATE: 36 ng/mL (ref >5.9)

## 2017-10-29 LAB — ECHOCARDIOGRAM COMPLETE
HEIGHTINCHES: 67 in
Weight: 3051.17 oz

## 2017-10-29 LAB — APTT: APTT: 60 s — AB (ref 24–36)

## 2017-10-29 LAB — FERRITIN: FERRITIN: 102 ng/mL (ref 24–336)

## 2017-10-29 LAB — ABO/RH: ABO/RH(D): AB POS

## 2017-10-29 MED ORDER — B COMPLEX-C PO TABS
1.0000 | ORAL_TABLET | Freq: Every day | ORAL | Status: DC
  Administered 2017-10-29 – 2017-11-03 (×6): 1 via ORAL
  Filled 2017-10-29 (×6): qty 1

## 2017-10-29 MED ORDER — ORAL CARE MOUTH RINSE
15.0000 mL | Freq: Two times a day (BID) | OROMUCOSAL | Status: DC
  Administered 2017-10-29 – 2017-11-02 (×3): 15 mL via OROMUCOSAL

## 2017-10-29 MED ORDER — VARENICLINE TARTRATE 1 MG PO TABS
1.0000 mg | ORAL_TABLET | Freq: Two times a day (BID) | ORAL | Status: DC
  Administered 2017-10-29 – 2017-11-03 (×9): 1 mg via ORAL
  Filled 2017-10-29 (×14): qty 1

## 2017-10-29 MED ORDER — PSYLLIUM 95 % PO PACK
1.0000 | PACK | Freq: Every day | ORAL | Status: DC
  Administered 2017-10-29 – 2017-11-03 (×6): 1 via ORAL
  Filled 2017-10-29 (×6): qty 1

## 2017-10-29 MED ORDER — DIPHENHYDRAMINE HCL 25 MG PO CAPS
50.0000 mg | ORAL_CAPSULE | Freq: Once | ORAL | Status: AC
  Administered 2017-10-29: 50 mg via ORAL
  Filled 2017-10-29: qty 2

## 2017-10-29 MED ORDER — RISAQUAD PO CAPS
1.0000 | ORAL_CAPSULE | Freq: Every day | ORAL | Status: DC
  Administered 2017-10-29 – 2017-11-03 (×6): 1 via ORAL
  Filled 2017-10-29 (×6): qty 1

## 2017-10-29 MED ORDER — IPRATROPIUM-ALBUTEROL 0.5-2.5 (3) MG/3ML IN SOLN
3.0000 mL | Freq: Four times a day (QID) | RESPIRATORY_TRACT | Status: DC
  Administered 2017-10-29: 3 mL via RESPIRATORY_TRACT
  Filled 2017-10-29: qty 3

## 2017-10-29 NOTE — Progress Notes (Signed)
  2D Echocardiogram has been performed.  Darlina Sicilian M 10/29/2017, 2:13 PM

## 2017-10-29 NOTE — ED Notes (Signed)
Tec called RN to room, He found Pt's IV had come out.  Pt was cleaned up.  New IV was attempted, unsuccessfully

## 2017-10-29 NOTE — Plan of Care (Signed)
Pt able to verbalize plan of care. Reviewed with RN and MD at bedside. Family at bedside.

## 2017-10-29 NOTE — Progress Notes (Signed)
PROGRESS NOTE    NUCHEM GRATTAN  WRU:[id number] DOB: 1941-04-26 DOA: 10/28/2017 PCP: Ronita Hipps, MD     Brief Narrative:  Kevin Watkins is a 77 y.o. male with medical history significant of hypertension, hyperlipidemia, COPD, GERD, CAD, stent placement, dCHF, alcohol abuse, tobacco abuse, PVD, iron deficiency anemia, atrial fibrillation on Eliquis, CKD-3, who presents with shortness of breath. Patient states that he has been having shortness of breath in the past 2 months, which has been progressively getting worse. He does not have chest pain, fever or chills. Patient states that he has mild cough, 2 or 3 days each day, but most of time he coughs up dark colored material, which he thinks blood. Patient has mild leg edema. Patient does not have rectal bleeding, hematochezia, hematemesis, hematuria. He states that he stopped smoking currently he is taking Chantix. In the ED, he was found to have worsening anemia, Hgb 7.9. CXR revealed interstitial lung disease with bilateral pleural effusion and patient was admitted for further work up and evaluation.   Assessment & Plan:   Principal Problem:   Acute on chronic respiratory failure with hypoxia (HCC) Active Problems:   CAD-RCA BMS 2001, low risk Myoview 2011   Tobacco abuse   Dyslipidemia, goal LDL below 70   Essential hypertension   Acute on chronic diastolic CHF (congestive heart failure) (HCC)   Paroxysmal atrial fibrillation (HCC)   Chronic anticoagulation   COPD with acute exacerbation (HCC)   CKD (chronic kidney disease), stage III (HCC)   Iron deficiency anemia   Pressure injury of skin  Acute on chronic respiratory failure with hypoxia  -Likely multifactorial etiology including CHF exacerbation and worsening anemia. Patient's weight has been stable per patient, but he has bilateral leg edema, positive JVD and elevated BNP 427, consistent with CHF exacerbation -Uses 2L [place] O2 at baseline  Acute on chronic diastolic CHF -Echo with  EF 60% -Lasix 40 mg IV daily -Daily weight, strict I/Os  -Continue metoprolol  Iron deficiency anemia -Hgb 11.5 in 12/2016 --> 8.5 10/25/2017 --> 7.9 on admission. Patient denies any hematochezia, melenotic stool  -Check FOBT  -Hold Eliquis -Continue iron supplementation -Patient has been following with Dr. Nehemiah Settle in [facility].  His last colonoscopy was January 08, 2014. At that time, patient reports that polyps were removed and he was recommended for repeat colonoscopy in 3 years.  After discussion with his GI physician, they discussed that patient is a very poor candidate for further colonoscopy due to his medical comorbidities and repeat colonoscopy was deferred at that time.  Hx of CAD-RCA BMS 2001, low risk Myoview 2011 -Continue ASA, lipitor and metoprolol  Tobacco abuse and alcohol abuse -He stopped smoking currently taking Chantix -CIWA   HLD -Continue lipitor, lovaza   HTN -Continue metoprolol -IV hydralazine prn  Paroxysmal atrial fibrillation -CHA2DS2-VASc Score is 5 -Hold Eliquis -Continue metoprolol  CKD stage III -Baseline creatinine 1.5-1.7 -Stable   DVT prophylaxis: Lovenox Code Status: Full Family Communication: At bedside Disposition Plan: Pending work up, echocardiogram, ?GI bleed   Consultants:   None  Procedures:   None   Antimicrobials:  Anti-infectives (From admission, onward)   None       Subjective: Patient feeling well at rest.  He states that he does get shortness of breath with any amount of exertion.  His cough remains chronic in nature.  He denies any chest pain, nausea, vomiting, diarrhea, abdominal pain.  He denies seeing any bloody or dark stools.  Objective: Vitals:   10/29/17 0648 10/29/17 0900 10/29/17 1048 10/29/17 1114  BP:   97/68   Pulse:  (!) 106 96   Resp:  (!) 27    Temp:  97.9 F (36.6 C)    TempSrc:  Oral    SpO2: 99% 96%  95%  Weight:  86.5 kg (190 lb 11.2 oz)    Height:       No intake or  output data in the 24 hours ending 10/29/17 1519 Filed Weights   10/28/17 1851 10/29/17 0900  Weight: 88.9 kg (196 lb) 86.5 kg (190 lb 11.2 oz)    Examination:  General exam: Appears calm and comfortable  Respiratory system: Clear to auscultation. Respiratory effort normal. Cardiovascular system: S1 & S2 heard, irregular rhythm, rate 110s. No JVD, murmurs, rubs, gallops or clicks. +1 pedal edema. Gastrointestinal system: Abdomen is nondistended, soft and nontender. No organomegaly or masses felt. Normal bowel sounds heard. Central nervous system: Alert and oriented. No focal neurological deficits. Extremities: Symmetric 5 x 5 power. Skin: No rashes, lesions or ulcers Psychiatry: Judgement and insight appear normal. Mood & affect appropriate.   Data Reviewed: I have personally reviewed following labs and imaging studies  CBC: Recent Labs  Lab 10/25/17 1609 10/28/17 1916 10/28/17 2331 10/29/17 0316 10/29/17 1227  WBC 5.8 7.0 7.0 7.1 7.0  HGB 8.8* 7.9* 8.1* 7.8* 7.8*  HCT 27.7* 24.5* 25.3* 24.5* 24.7*  MCV 85.5 86.9 86.6 86.6 87.0  PLT 133* 154 154 160 235   Basic Metabolic Panel: Recent Labs  Lab 10/25/17 1114 10/28/17 1916 10/29/17 0316  NA 135 137 137  K 3.4* 4.2 4.0  CL 98* 101 102  CO2 27 25 24   GLUCOSE 115* 120* 134*  BUN 18 27* 24*  CREATININE 1.63* 1.53* 1.53*  CALCIUM 8.9 9.2 9.0   GFR: Estimated Creatinine Clearance: 41.8 mL/min (A) (by C-G formula based on SCr of 1.53 mg/dL (H)). Liver Function Tests: No results for input(s): AST, ALT, ALKPHOS, BILITOT, PROT, ALBUMIN in the last 168 hours. No results for input(s): LIPASE, AMYLASE in the last 168 hours. No results for input(s): AMMONIA in the last 168 hours. Coagulation Profile: Recent Labs  Lab 10/28/17 2331  INR 1.58   Cardiac Enzymes: No results for input(s): CKTOTAL, CKMB, CKMBINDEX, TROPONINI in the last 168 hours. BNP (last 3 results) No results for input(s): PROBNP in the last 8760  hours. HbA1C: No results for input(s): HGBA1C in the last 72 hours. CBG: No results for input(s): GLUCAP in the last 168 hours. Lipid Profile: No results for input(s): CHOL, HDL, LDLCALC, TRIG, CHOLHDL, LDLDIRECT in the last 72 hours. Thyroid Function Tests: No results for input(s): TSH, T4TOTAL, FREET4, T3FREE, THYROIDAB in the last 72 hours. Anemia Panel: Recent Labs    10/28/17 2340  VITAMINB12 483  FOLATE 36.0  FERRITIN 102  TIBC 347  IRON 41*  RETICCTPCT 3.7*   Sepsis Labs: No results for input(s): PROCALCITON, LATICACIDVEN in the last 168 hours.  No results found for this or any previous visit (from the past 240 hour(s)).     Radiology Studies: Dg Chest 2 View  Result Date: 10/28/2017 CLINICAL DATA:  Cough and SOB x2 months. Hx of COPD, CAD, CHF, HTN. Smoker. EXAM: CHEST - 2 VIEW COMPARISON:  10/25/2016 FINDINGS: Heart size is accentuated by technique. There is diffuse prominence of interstitial markings with a basilar predominance. Small bilateral pleural effusions are again noted, possibly slightly increased. There is minimal bibasilar atelectasis. IMPRESSION: 1. Chronic  interstitial lung disease. 2. Increasing bilateral pleural effusions and minimal bibasilar atelectasis. Electronically Signed   By: Nolon Nations M.D.   On: 10/28/2017 19:42      Scheduled Meds: . acidophilus  1 capsule Oral Daily  . aspirin EC  81 mg Oral Daily  . atorvastatin  40 mg Oral q1800  . B-complex with vitamin C  1 tablet Oral Daily  . enoxaparin (LOVENOX) injection  40 mg Subcutaneous Q24H  . famotidine  10 mg Oral Daily  . ferrous sulfate  325 mg Oral BID WC  . folic acid  1 mg Oral Daily  . furosemide  40 mg Intravenous Daily  . ipratropium-albuterol  3 mL Nebulization Q4H  . LORazepam  0-4 mg Intravenous Q6H   Followed by  . [START ON 10/31/2017] LORazepam  0-4 mg Intravenous Q12H  . metoprolol tartrate  12.5 mg Oral BID  . multivitamin with minerals  1 tablet Oral Daily  .  omega-3 acid ethyl esters  1 g Oral Daily  . psyllium  1 packet Oral Daily  . sodium chloride flush  3 mL Intravenous Q12H  . thiamine  100 mg Oral Daily   Or  . thiamine  100 mg Intravenous Daily  . varenicline  1 mg Oral BID  . vitamin E  400 Units Oral Daily   Continuous Infusions: . sodium chloride       LOS: 0 days    Time spent: 40 minutes   Dessa Phi, DO [facility] [email or web address] Password [facility] 10/29/2017, 3:19 PM

## 2017-10-29 NOTE — ED Notes (Signed)
Pt placed on hospital bed for comfort.  Pt became very SOB when he got out of bed and sat in the chair as RN moved bed.  Pt did recover after he got back in his bed.

## 2017-10-30 ENCOUNTER — Other Ambulatory Visit: Payer: Self-pay

## 2017-10-30 DIAGNOSIS — R195 Other fecal abnormalities: Secondary | ICD-10-CM

## 2017-10-30 DIAGNOSIS — D649 Anemia, unspecified: Secondary | ICD-10-CM

## 2017-10-30 DIAGNOSIS — D6489 Other specified anemias: Secondary | ICD-10-CM

## 2017-10-30 LAB — BASIC METABOLIC PANEL
ANION GAP: 8 (ref 5–15)
BUN: 31 mg/dL — ABNORMAL HIGH (ref 6–20)
CO2: 24 mmol/L (ref 22–32)
Calcium: 8.8 mg/dL — ABNORMAL LOW (ref 8.9–10.3)
Chloride: 102 mmol/L (ref 101–111)
Creatinine, Ser: 1.48 mg/dL — ABNORMAL HIGH (ref 0.61–1.24)
GFR calc Af Amer: 50 mL/min — ABNORMAL LOW (ref >60)
GFR calc non Af Amer: 44 mL/min — ABNORMAL LOW (ref >60)
GLUCOSE: 146 mg/dL — AB (ref 65–99)
POTASSIUM: 4.2 mmol/L (ref 3.5–5.1)
Sodium: 134 mmol/L — ABNORMAL LOW (ref 135–145)

## 2017-10-30 LAB — CBC
HEMATOCRIT: 23.7 % — AB (ref 39.0–52.0)
HEMOGLOBIN: 7.7 g/dL — AB (ref 13.0–17.0)
MCH: 28.2 pg (ref 26.0–34.0)
MCHC: 32.5 g/dL (ref 30.0–36.0)
MCV: 86.8 fL (ref 78.0–100.0)
Platelets: 184 10*3/uL (ref 150–400)
RBC: 2.73 MIL/uL — ABNORMAL LOW (ref 4.22–5.81)
RDW: 19.5 % — ABNORMAL HIGH (ref 11.5–15.5)
WBC: 10.9 10*3/uL — ABNORMAL HIGH (ref 4.0–10.5)

## 2017-10-30 MED ORDER — IPRATROPIUM-ALBUTEROL 0.5-2.5 (3) MG/3ML IN SOLN
3.0000 mL | Freq: Three times a day (TID) | RESPIRATORY_TRACT | Status: DC
  Administered 2017-10-30 – 2017-11-03 (×13): 3 mL via RESPIRATORY_TRACT
  Filled 2017-10-30 (×15): qty 3

## 2017-10-30 MED ORDER — PANTOPRAZOLE SODIUM 40 MG PO TBEC
40.0000 mg | DELAYED_RELEASE_TABLET | Freq: Every day | ORAL | Status: DC
  Administered 2017-10-30 – 2017-11-03 (×4): 40 mg via ORAL
  Filled 2017-10-30 (×4): qty 1

## 2017-10-30 NOTE — Patient Outreach (Signed)
[facility]) Care Management  10/30/2017  Kevin Watkins [birth year] [id number]   Telephone Screen  Referral Date: 10/26/17 Referral Source: [facility] Dept. Referral Reason: "member would like to receive medication co-pay assistance" Insurance: [employer]    RN CM received hospital ADT notification that [atietn admitted to the hospital on 10/28/17. Notification sent to [facility] hospital liaisons.     Enzo Montgomery, RN,BSN,CCM [facility] Management Telephonic Care Management Coordinator Direct Phone: [phone] Toll Free: [phone] Fax: [phone]

## 2017-10-30 NOTE — Progress Notes (Signed)
Pt continues to be confused and lethargic after prn med. Paged MD. Awaiting new orders.

## 2017-10-30 NOTE — Progress Notes (Signed)
PT Cancellation Note  Patient Details Name: Kevin Watkins MRN: [id number] DOB: [birth year]   Cancelled Treatment:    Reason Eval/Treat Not Completed: Fatigue/lethargy limiting ability to participate . Patient very lethargic and had eyes closed through majority of interaction; answered questions intermittently. Patient daughter states patient is still drowsy from Benadryl given last night. Will follow up this afternoon to see if patient is more alert.   Ellamae Sia, PT, DPT Acute Rehabilitation Services  Pager: [phone]   Willy Eddy 10/30/2017, 12:06 PM

## 2017-10-30 NOTE — Evaluation (Signed)
Physical Therapy Evaluation Patient Details Name: CORNELUIS ALLSTON MRN: [id number] DOB: 1941-03-08 Today's Date: 10/30/2017   History of Present Illness  Pt. is a 77 y.o. M with significant PMH of hypertension, hyperlipidemia, COPD, GERD, CAD, stent placement, CHF, alcohol abuse, tobacco abuse, PVD, iron deficiency anemia, atrial fibrillation on Eliquis, CKD-3, who presents with shortness of breath. Chest x-ray showing interstitial lung disease with bilateral pleural effusion.   Clinical Impression  Patient seems close to baseline per patient and patient daughter. Prior to admission, ambulates household distances with Rollator. Upon PT evaluation, patient ambulating 180 feet with RW and min guard assist. Unable to obtain adequate SpO2 during ambulation; upon return to room patient at 93% SpO2 on 2L [place]. Patient is on 2L [place] at home. Will follow acutely for Rollator gait training and to progress endurance.     Follow Up Recommendations Home health PT;Supervision for mobility/OOB    Equipment Recommendations  None recommended by PT    Recommendations for Other Services       Precautions / Restrictions Precautions Precautions: Fall Restrictions Weight Bearing Restrictions: No      Mobility  Bed Mobility               General bed mobility comments: OOB in recliner  Transfers Overall transfer level: Modified independent Equipment used: None;Rolling walker (2 wheeled)             General transfer comment: Modified independent with sit to stand transfers with and without RW. VC's for hand placement but patient still performed sit to stand with 2 hands on RW.   Ambulation/Gait Ambulation/Gait assistance: Min guard Ambulation Distance (Feet): 180 Feet Assistive device: Rolling walker (2 wheeled) Gait Pattern/deviations: Step-through pattern;Decreased dorsiflexion - right;Decreased dorsiflexion - left Gait velocity: decreased   General Gait Details: VC's for RW proximity;  patient able to maintain.   Stairs            Wheelchair Mobility    Modified Rankin (Stroke Patients Only)       Balance Overall balance assessment: Mild deficits observed, not formally tested                                           Pertinent Vitals/Pain Pain Assessment: No/denies pain    Home Living Family/patient expects to be discharged to:: Private residence Living Arrangements: Spouse/significant other Available Help at Discharge: Family Type of Home: House Home Access: Ramped entrance     Home Layout: One [place]: Clinical cytogeneticist - 4 wheels      Prior Function Level of Independence: Needs assistance   Gait / Transfers Assistance Needed: Engineer, manufacturing systems for household distances  ADL's / Homemaking Assistance Needed: States his wife sets him up for bathing        Hand Dominance   Dominant Hand: Right    Extremity/Trunk Assessment   Upper Extremity Assessment Upper Extremity Assessment: Overall WFL for tasks assessed    Lower Extremity Assessment Lower Extremity Assessment: Overall WFL for tasks assessed    Cervical / Trunk Assessment Cervical / Trunk Assessment: Kyphotic  Communication   Communication: HOH(Wears hearing aids)  Cognition Arousal/Alertness: Awake/alert Behavior During Therapy: WFL for tasks assessed/performed Overall Cognitive Status: Within Functional Limits for tasks assessed  General Comments: Pt daughter states patient is having mild confusion since receiving Benadryl last night. Patient alert and oriented x 4. HOH.       General Comments General comments (skin integrity, edema, etc.): Patient able to don pants with set up in standing    Exercises     Assessment/Plan    PT Assessment Patient needs continued PT services  PT Problem List Decreased strength;Decreased activity tolerance;Decreased balance;Decreased mobility;Decreased  knowledge of use of DME;Cardiopulmonary status limiting activity       PT Treatment Interventions DME instruction;Gait training;Stair training;Functional mobility training;Therapeutic activities;Therapeutic exercise;Balance training;Patient/family education    PT Goals (Current goals can be found in the Care Plan section)  Acute Rehab PT Goals Patient Stated Goal: Be able to go eat breakfast with friends PT Goal Formulation: With patient Time For Goal Achievement: 11/13/17 Potential to Achieve Goals: Good    Frequency Min 3X/week   Barriers to discharge        Co-evaluation               AM-PAC PT "6 Clicks" Daily Activity  Outcome Measure Difficulty turning over in bed (including adjusting bedclothes, sheets and blankets)?: None Difficulty moving from lying on back to sitting on the side of the bed? : A Little Difficulty sitting down on and standing up from a chair with arms (e.g., wheelchair, bedside commode, etc,.)?: A Little Help needed moving to and from a bed to chair (including a wheelchair)?: A Little Help needed walking in hospital room?: A Little Help needed climbing 3-5 steps with a railing? : A Lot 6 Click Score: 18    End of Session Equipment Utilized During Treatment: Gait belt;Oxygen Activity Tolerance: Patient tolerated treatment well Patient left: in chair;with call bell/phone within reach;with family/visitor present Nurse Communication: Mobility status PT Visit Diagnosis: Unsteadiness on feet (R26.81);Difficulty in walking, not elsewhere classified (R26.2)    Time: 1439-1500 PT Time Calculation (min) (ACUTE ONLY): 21 min   Charges:   PT Evaluation $PT Eval Moderate Complexity: 1 Mod     PT G Codes:        Ellamae Sia, PT, DPT Acute Rehabilitation Services  Pager: [place] 10/30/2017, 3:12 PM

## 2017-10-30 NOTE — Consult Note (Addendum)
Consultation  Referring Provider: [facility] hospitalist/DR Valdez Primary Care Physician:  Ronita Hipps, MD Primary Gastroenterologist:  Elliot Dally Tia Alert  Reason for Consultation:   Iron deficiency anemia, heme +  HPI: Kevin Watkins is a 77 y.o. male with multiple medical problems who was admitted yesterday through the emergency room with complaints of progressive shortness of breath.  Patient has history of COPD and is O2 dependent/2 L nasal cannula chronically.  He stated that he has been more short of breath over the past few weeks and has been coughing up some dark material which she was concerned was blood. Patient has history of paroxysmal atrial fibrillation, on chronic Eliquis.  Chronic kidney disease stage III, peripheral arterial disease status post multiple previous interventions, congestive heart failure diastolic, history of EtOH abuse, hypertension, chronic GERD for which he is on Zantac.  Also with coronary artery disease status post bare-metal stent in 2001. In the ER chest x-ray showed chronic lung disease and increase in bilateral effusions, BNP elevated at 427 and felt to be having an acute CHF exacerbation.  He was noted on exam to have peripheral edema and JVD. Echo this admission shows EF of 60%. Labs on 10/28/2017 hemoglobin 7.9 hematocrit of 20.5 MCV of 86 platelets 154.  On 10/25/2017 hemoglobin 8.5 in June 2018 hemoglobin 11.5.  Pro time was 18.8 on admission INR 1.58 B12 and folate levels within normal limits, ferritin 102, serum iron 41 TIBC 347 and iron sat of 12.  He was documented to be heme positive on admission. He has not been transfused as she had an hemoglobin 7.7 today. Patient is known to Dr. Melina Copa in [place] and per notes had colonoscopy last in 2015 with removal of polyps.  He was recommended to have 3-year interval follow-up.  Patient and daughter today both state that he saw Dr. Melina Copa within the past month and decision was made not to pursue any further  colonoscopies due to his multiple comorbidities.  He has not had previous EGD as far as they are aware. Patient denies any problems with heartburn indigestion, or dysphasia.  His appetite has been fair, he denies any weight loss.  Patient denies any problems with recent abdominal pain, changes in bowel habits melena or hematochezia.  Patient stopped smoking several months ago, his daughter says he has not been drinking alcohol on a regular basis over the past several months either. He says he feels better today and is not as short of breath.   Past Medical History:  Diagnosis Date  . CAD in native artery 06/2000   BMS to the RCA; known 60% mid dominant RCA stenosis and 50-60% OM 2 branch stenosis normal LV function. Last Myoview May 2011 negative for ischemia.  . CHF (congestive heart failure) ([place])   . COPD (chronic obstructive pulmonary disease) ([place])   . Dyslipidemia, goal LDL below 70   . Dyspnea   . Edema   . EtOH dependence ([place])    Family reported  . Hypertension   . Medical history non-contributory   . Oxygen deficiency   . PAD (peripheral artery disease) ([facility])    iliac, SCA, Innominate, and renal PTA  . Tobacco abuse     Past Surgical History:  Procedure Laterality Date  . ANGIOPLASTY  '01, '03   innominate artery PTA  . Carotid Doppler  2013   R vertebral appears occluded, R subclavian with prox occlusion v. high grade stenosis; R bulb 70-99% diameter reduction; R prox ICA 50-69%  diameter reduction; L bulb & prox ICA 50-69% diameter reduction; L subclavian with >50% diameter reduction  . CORONARY ANGIOPLASTY WITH STENT PLACEMENT  06/26/2000    BMS to RCA; residual 60%mid dominant RCA stent, 50-60% OM2 stenosis (Dr. Adora Fridge)  . NM MYOCAR PERF WALL MOTION  2011   persantine myoview - normal perfusion in all regions, EF 64%  . RENAL ANGIOGRAM  09/22/2005   stent to right renal artery with 5x12 Genesis on Aviator stent balloon pre-mount (Dr. Adora Fridge)  . Renal Doppler  2013     R prox renal artery stent with 60-99% in-stent restenosis; L prox renal artery =/> 60% diameter reduction; R & L kidneys normal in size  . SUBCLAVIAN ANGIOGRAM  07/15/2004   stent to right subclavian (7x18 Genesis) and innominate (8x24 Genesis), with known ostial left common carotid stenosis and left subclavian stenosis as well (Dr. Adora Fridge)  . TRANSTHORACIC ECHOCARDIOGRAM  2013   EF=>55%, mild conc LVH; LA mod dilated; RA mild-mod dilated; mild mitral annular calcif, mild MR; mild TR with normal RSVP  . Upper Extremity Arterial Doppler  2013   R prox subclavian artery stent with prox occlusion v. high grade stenosis; R vertebral appears occluded; L subclavian with >60% diameter reduction    Prior to Admission medications   Medication Sig Start Date End Date Taking? Authorizing Provider  apixaban (ELIQUIS) 5 MG TABS tablet Take 1 tablet (5 mg total) by mouth 2 (two) times daily. 06/05/17  Yes Lorretta Harp, MD  aspirin EC 81 MG tablet Take 81 mg by mouth daily.   Yes [provider]  atorvastatin (LIPITOR) 40 MG tablet Take 40 mg by mouth daily at 6 PM.  12/26/12  Yes [provider]  B Complex-Biotin-FA (VITAMIN B50 COMPLEX PO) Take 1 tablet by mouth daily.   Yes [provider]  diphenhydrAMINE (BENADRYL) 25 MG tablet Take 25-50 mg by mouth See admin instructions. Take two capsules (50 mg) by mouth daily at bedtime, may also take one or two capsule (25-50 mg) during the night as needed for sleep   Yes [provider]  ferrous sulfate 325 (65 FE) MG tablet Take 1 tablet (325 mg total) by mouth 2 (two) times daily with a meal. 10/25/17  Yes Sherwood Gambler, MD  folic acid (FOLVITE) 161 MCG tablet Take 800 mcg by mouth daily.   Yes [provider]  furosemide (LASIX) 40 MG tablet Take 1 tablet by mouth daily. May take extra 40 mg daily in afternoon for weight gain of 3lbs Patient taking differently: Take 40 mg by mouth See admin instructions. Take  one tablet (40 mg) by mouth every morning,  Take an extra tablet (40 mg) with lunch as needed for leg swelling or weight gain of 2-3 lbs overnight 06/06/17  Yes Lorretta Harp, MD  Garlic 0960 MG CAPS Take 1,000 mg by mouth daily.    Yes [provider]  guaifenesin (ROBITUSSIN) 100 MG/5ML syrup Take by mouth every 4 (four) hours as needed for cough.   Yes [provider]  ipratropium-albuterol (DUONEB) 0.5-2.5 (3) MG/3ML SOLN Take 3 mLs by nebulization 4 (four) times daily.    Yes [provider]  Lactobacillus (PROBIOTIC ACIDOPHILUS PO) Take 1 tablet by mouth daily.   Yes [provider]  metoprolol tartrate (LOPRESSOR) 25 MG tablet Take 12.5 mg by mouth 2 (two) times daily.  08/16/17  Yes [provider]  Omega-3 Fatty Acids (FISH OIL) 1200 MG CAPS  Take 1,200 mg by mouth 2 (two) times daily.   Yes [provider]  OVER THE COUNTER MEDICATION Place 2 drops under the tongue 2 (two) times daily. CBD tincture/hemp flower extract   Yes [provider]  OXYGEN Inhale 2 L into the lungs continuous.   Yes [provider]  polyethylene glycol (MIRALAX / GLYCOLAX) packet Take by mouth See admin instructions. Mix 3 teaspoonsful (15 mls) in water and drink every evening   Yes [provider]  potassium chloride SA (K-DUR,KLOR-CON) 20 MEQ tablet Take 1 tablet (20 mEq total) by mouth daily. 10/25/17  Yes Sherwood Gambler, MD  PROAIR HFA [phone] Base) MCG/ACT inhaler Inhale 2 puffs into the lungs every 6 (six) hours as needed for wheezing or shortness of breath. 12/24/16  Yes Alphonzo Grieve, MD  ranitidine (ZANTAC) 75 MG tablet Take 75 mg by mouth daily.   Yes [provider]  thiamine 100 MG tablet Take 1 tablet (100 mg total) by mouth daily. 12/28/16  Yes Lorretta Harp, MD  varenicline (CHANTIX PAK) 0.5 MG X 11 & 1 MG X 42 tablet Take 0.5 mg by mouth See admin instructions. Take one tablet (0.5 mg)  by mouth once daily  for 3 days, then increase to one tablet (0.5 mg)  twice daily for 4 days, then increase to  one tablet (1 mg) twice daily for 21 days   Yes [provider]  vitamin E (VITAMIN E) 400 UNIT capsule Take 400 Units by mouth daily.   Yes [provider]  Wheat Dextrin (BENEFIBER PO) Take 20 mLs by mouth daily. Mix 4 teaspoon (20 ml) in water and drink daily   Yes [provider]  guaiFENesin-dextromethorphan (ROBITUSSIN DM) 100-10 MG/5ML syrup Take 5 mLs by mouth every 4 (four) hours as needed for cough. Patient not taking: Reported on 10/28/2017 12/24/16   Alphonzo Grieve, MD    Current Facility-Administered Medications  Medication Dose Route Frequency Provider Last Rate Last Dose  . 0.9 %  sodium chloride infusion  250 mL Intravenous PRN Ivor Costa, MD      . acetaminophen (TYLENOL) tablet 650 mg  650 mg Oral Q4H PRN Ivor Costa, MD      . acidophilus (RISAQUAD) capsule 1 capsule  1 capsule Oral Daily Ivor Costa, MD   1 capsule at 10/29/17 1228  . albuterol (PROVENTIL) (2.5 MG/3ML) 0.083% nebulizer solution 2.5 mg  2.5 mg Nebulization Q4H PRN Ivor Costa, MD      . atorvastatin (LIPITOR) tablet 40 mg  40 mg Oral q1800 Ivor Costa, MD   40 mg at 10/29/17 1702  . B-complex with vitamin C tablet 1 tablet  1 tablet Oral Daily Ivor Costa, MD   1 tablet at 10/29/17 1227  . famotidine (PEPCID) tablet 10 mg  10 mg Oral Daily Ivor Costa, MD   10 mg at 10/29/17 1228  . ferrous sulfate tablet 325 mg  325 mg Oral BID WC Ivor Costa, MD   325 mg at 10/29/17 1702  . folic acid (FOLVITE) tablet 1 mg  1 mg Oral Daily Ivor Costa, MD   1 mg at 10/29/17 1228  . furosemide (LASIX) injection 40 mg  40 mg Intravenous Daily Ivor Costa, MD   40 mg at 10/29/17 1229  . guaiFENesin (ROBITUSSIN) 100 MG/5ML solution 100 mg  100 mg Oral Q4H PRN Ivor Costa, MD      . ipratropium-albuterol (DUONEB) 0.5-2.5 (3) MG/3ML nebulizer solution 3 mL  3 mL  Nebulization TID Dessa Phi, DO   3 mL at 10/30/17 0815  .  LORazepam (ATIVAN) injection 0-4 mg  0-4 mg Intravenous Q6H Ivor Costa, MD   2 mg at 10/30/17 0112   Followed by  . [START ON 10/31/2017] LORazepam (ATIVAN) injection 0-4 mg  0-4 mg Intravenous Q12H Ivor Costa, MD      . LORazepam (ATIVAN) tablet 1 mg  1 mg Oral Q6H PRN Ivor Costa, MD       Or  . LORazepam (ATIVAN) injection 1 mg  1 mg Intravenous Q6H PRN Ivor Costa, MD      . MEDLINE mouth rinse  15 mL Mouth Rinse BID Dessa Phi, DO   15 mL at 10/29/17 1709  . metoprolol tartrate (LOPRESSOR) tablet 12.5 mg  12.5 mg Oral BID Ivor Costa, MD   12.5 mg at 10/29/17 2240  . multivitamin with minerals tablet 1 tablet  1 tablet Oral Daily Ivor Costa, MD   1 tablet at 10/29/17 1227  . omega-3 acid ethyl esters (LOVAZA) capsule 1 g  1 g Oral Daily Ivor Costa, MD   1 g at 10/29/17 1228  . ondansetron (ZOFRAN) injection 4 mg  4 mg Intravenous Q6H PRN Ivor Costa, MD      . polyethylene glycol (MIRALAX / GLYCOLAX) packet 17 g  17 g Oral Daily PRN Ivor Costa, MD      . psyllium (HYDROCIL/METAMUCIL) packet 1 packet  1 packet Oral Daily Ivor Costa, MD   1 packet at 10/29/17 1230  . sodium chloride flush (NS) 0.9 % injection 3 mL  3 mL Intravenous Q12H Ivor Costa, MD   3 mL at 10/29/17 2200  . sodium chloride flush (NS) 0.9 % injection 3 mL  3 mL Intravenous PRN Ivor Costa, MD      . thiamine (VITAMIN B-1) tablet 100 mg  100 mg Oral Daily Ivor Costa, MD   100 mg at 10/29/17 1227   Or  . thiamine (B-1) injection 100 mg  100 mg Intravenous Daily Ivor Costa, MD      . varenicline (CHANTIX) tablet 1 mg  1 mg Oral BID Ivor Costa, MD   1 mg at 10/29/17 2241  . vitamin E capsule 400 Units  400 Units Oral Daily Ivor Costa, MD        Allergies as of 10/28/2017  . (No Known Allergies)    Family History  Problem Relation Age of Onset  . Heart disease Mother        hx CABG  . Heart disease Father        hx of CABG  . Cancer Sister   . Heart attack Brother   . Heart disease Brother     Social History    Socioeconomic History  . Marital status: Married    Spouse name: Not on file  . Number of children: Not on file  . Years of education: Not on file  . Highest education level: Not on file  Occupational History  . Not on file  Social Needs  . Financial resource strain: Not on file  . Food insecurity:    Worry: Not on file    Inability: Not on file  . Transportation needs:    Medical: Not on file    Non-medical: Not on file  Tobacco Use  . Smoking status: Former Smoker    Packs/day: 1.00    Types: Cigarettes    Last attempt to quit: 12/17/2016    Years since quitting:  0.8  . Smokeless tobacco: Never Used  . Tobacco comment: pt states he has quit x 3 weeks   Substance and Sexual Activity  . Alcohol use: Not Currently    Alcohol/week: 12.6 oz    Types: 21 Cans of beer per week    Comment: last beer x 1 week   . Drug use: No  . Sexual activity: Not on file  Lifestyle  . Physical activity:    Days per week: Not on file    Minutes per session: Not on file  . Stress: Not on file  Relationships  . Social connections:    Talks on phone: Not on file    Gets together: Not on file    Attends religious service: Not on file    Active member of club or organization: Not on file    Attends meetings of clubs or organizations: Not on file    Relationship status: Not on file  . Intimate partner violence:    Fear of current or ex partner: Not on file    Emotionally abused: Not on file    Physically abused: Not on file    Forced sexual activity: Not on file  Other Topics Concern  . Not on file  Social History Narrative  . Not on file    Review of Systems: Pertinent positive and negative review of systems were noted in the above HPI section.  All other review of systems was otherwise negative.  Physical Exam: Vital signs in last 24 hours: Temp:  [96.5 F (35.8 C)-97.9 F (36.6 C)] 96.5 F (35.8 C) (04/22 0346) Pulse Rate:  [87-123] 87 (04/22 0600) Resp:  [24-27] 25 (04/22  0346) BP: (98-134)/(49-77) 103/77 (04/22 0600) SpO2:  [96 %-100 %] 100 % (04/22 0346) Weight:  [197 lb 15.6 oz (89.8 kg)] 197 lb 15.6 oz (89.8 kg) (04/22 0346) Last BM Date: 10/29/17 General:   Alert,  Well-developed, chronically ill-appearing elderly white male, sitting up eating lunch.  Daughter at bedside.  Patient on 3 L nasal cannula  head:  Normocephalic and atraumatic. Eyes:  Sclera clear, no icterus.   Conjunctiva pale. Ears:  Normal auditory acuity. Nose:  No deformity, discharge,  or lesions. Mouth:  No deformity or lesions.   Neck:  Supple; no masses or thyromegaly. Lungs: Significantly decreased breath sounds bilaterally  heart:  irRegular rate and rhythm; no murmurs, clicks, rubs,  or gallops. Abdomen:  Soft,nontender, BS active,nonpalp mass or hsm.   Rectal:  Deferred , documented heme positive Msk:  Symmetrical without gross deformities. . Pulses:  Normal pulses noted. Extremities: 1-2+ edema bilateral lower extremities Neurologic:  Alert and  oriented x4;  grossly normal neurologically. Skin:  Intact without significant lesions or rashes.. Psych:  Alert and cooperative.  Flat affect, minimally conversant  Intake/Output from previous day: 04/21 0701 - 04/22 0700 In: -  Out: 700 [Urine:700] Intake/Output this shift: No intake/output data recorded.  Lab Results: Recent Labs    10/29/17 0316 10/29/17 1227 10/30/17 0346  WBC 7.1 7.0 10.9*  HGB 7.8* 7.8* 7.7*  HCT 24.5* 24.7* 23.7*  PLT 160 170 184   BMET Recent Labs    10/28/17 1916 10/29/17 0316 10/30/17 0346  NA 137 137 134*  K 4.2 4.0 4.2  CL 101 102 102  CO2 25 24 24   GLUCOSE 120* 134* 146*  BUN 27* 24* 31*  CREATININE 1.53* 1.53* 1.48*  CALCIUM 9.2 9.0 8.8*   LFT No results for input(s): PROT, ALBUMIN, AST, ALT,  ALKPHOS, BILITOT, BILIDIR, IBILI in the last 72 hours. PT/INR Recent Labs    10/28/17 2331  LABPROT 18.8*  INR 1.58     IMPRESSION:  #25 77 year old white male admitted  yesterday with progressive shortness of breath, felt consistent with CHF exacerbation. Patient has elevated BNP, bilateral pleural effusions, peripheral edema and JVD, superimposed on severe COPD/O2 dependent #2 coronary artery disease status post remote stent 2001 #3.  Peripheral vascular disease status post multiple interventions #4.  Atrial fibrillation-chronic Eliquis #5 kidney disease stage III  #6 history of congestive heart failure #7history of EtOH abuse question inactive #8  Hypertension #9 Normocytic anemia, and Hemoccult positive stool in setting of chronic anticoagulation.  Patient has previous history of colon polyps and previous serial colonoscopies per Dr. Melina Copa last exam 2015. Rule out occult colon lesion, rule out chronic gastropathy, rule out AVMs  PLAN: #1 serial hemoglobins and transfuse for hemoglobin 7 or less   #2 hold Eliquis #3 PPI once daily, p.o. #4 Consider colonoscopy and EGD this admission.  Patient would be best served by having procedures done while he is hospitalized and chronic oxygen use, and while off Eliquis. He is high risk for potential complications with sedation. Try to maximize his cardiopulmonary status over the next few days, prior to scheduling for procedures.   Ezzie Senat  10/30/2017, 12:14 PM

## 2017-10-30 NOTE — Progress Notes (Signed)
Pt awoke confused, agitated and combative. Pt oriented to self and place. Pt recognized wife but was disoriented to situation and time. Treated with PRN. Pt wife states pt only drinks "one beer a month". On call MD notified. Pt resting in bed. Will continue to follow.

## 2017-10-30 NOTE — Progress Notes (Signed)
PROGRESS NOTE    Kevin Watkins  ZOX:[id number] DOB: 06-25-1941 DOA: 10/28/2017 PCP: Ronita Hipps, MD     Brief Narrative:  Kevin Watkins is a 77 y.o. male with medical history significant of hypertension, hyperlipidemia, COPD, GERD, CAD, stent placement, dCHF, alcohol abuse, tobacco abuse, PVD, iron deficiency anemia, atrial fibrillation on Eliquis, CKD-3, who presents with shortness of breath. Patient states that he has been having shortness of breath in the past 2 months, which has been progressively getting worse. He does not have chest pain, fever or chills. Patient states that he has mild cough, 2 or 3 days each day, but most of time he coughs up dark colored material, which he thinks blood. Patient has mild leg edema. Patient does not have rectal bleeding, hematochezia, hematemesis, hematuria. He states that he stopped smoking currently he is taking Chantix. In the ED, he was found to have worsening anemia, Hgb 7.9. CXR revealed interstitial lung disease with bilateral pleural effusion and patient was admitted for further work up and evaluation.   Assessment & Plan:   Principal Problem:   Acute on chronic respiratory failure with hypoxia (HCC) Active Problems:   CAD-RCA BMS 2001, low risk Myoview 2011   Tobacco abuse   Dyslipidemia, goal LDL below 70   Essential hypertension   Acute on chronic diastolic CHF (congestive heart failure) (HCC)   Paroxysmal atrial fibrillation (HCC)   Chronic anticoagulation   COPD with acute exacerbation (HCC)   CKD (chronic kidney disease), stage III (HCC)   Iron deficiency anemia   Pressure injury of skin  Acute on chronic respiratory failure with hypoxia  -Likely multifactorial etiology including CHF exacerbation and worsening anemia. Patient's weight has been stable per patient, but he has bilateral leg edema, positive JVD and elevated BNP 427, consistent with CHF exacerbation -Uses 2L [place] O2 at baseline  Acute on chronic diastolic CHF -Echo with  EF 60% -Lasix 40 mg IV daily -Daily weight, strict I/Os  -Continue metoprolol  Iron deficiency anemia with blood loss anemia  -Hgb 11.5 in 12/2016 --> 8.5 10/25/2017 --> 7.9 on admission. Patient denies any hematochezia, melenotic stool  -Hold Eliquis -Continue iron supplementation -Patient has been following with Dr. Nehemiah Settle in [facility].  His last colonoscopy was January 08, 2014. At that time, patient reports that polyps were removed and he was recommended for repeat colonoscopy in 3 years.  After discussion with his GI physician, they discussed that patient is a very poor candidate for further colonoscopy due to his medical comorbidities and repeat colonoscopy was deferred at that time. -Consult GI today due to anemia, positive FOBT  -Hgb stable at 7.7 today   Delirium -Had episode of agitation and confusion overnight 4/21. He received benadryl 50mg  for sleep (which he takes at home as well), went to sleep, then woke up in the middle of the night very confused and agitated.  -Hold off on additional doses of benadryl going forward  -Monitor mentation   Hx of CAD-RCA BMS 2001, low risk Myoview 2011 -Continue lipitor and metoprolol -Hold aspirin   Tobacco abuse and alcohol abuse -He stopped smoking currently taking Chantix -CIWA   HLD -Continue lipitor, lovaza   HTN -Continue metoprolol -IV hydralazine prn  Paroxysmal atrial fibrillation -CHA2DS2-VASc Score is 5 -Hold Eliquis -Continue metoprolol  CKD stage III -Baseline creatinine 1.5-1.7 -Stable   DVT prophylaxis: SCD  Code Status: Full Family Communication: Wife at bedside, also spoke with daughter over the phone  Disposition Plan:  Pending GI work up    Consultants:   GI   Procedures:   None   Antimicrobials:  Anti-infectives (From admission, onward)   None       Subjective: Had episode of agitation and confusion overnight 4/21. He received benadryl 50mg  for sleep (which he takes at home  as well), went to sleep, then woke up in the middle of the night very confused and agitated.   Objective: Vitals:   10/29/17 2100 10/30/17 0120 10/30/17 0346 10/30/17 0600  BP:   (!) 134/49 103/77  Pulse:  100 (!) 101 87  Resp:   (!) 25   Temp:   (!) 96.5 F (35.8 C)   TempSrc:   Axillary   SpO2: 96%  100%   Weight:   89.8 kg (197 lb 15.6 oz)   Height:        Intake/Output Summary (Last 24 hours) at 10/30/2017 0831 Last data filed at 10/29/2017 1700 Gross per 24 hour  Intake -  Output 700 ml  Net -700 ml   Filed Weights   10/28/17 1851 10/29/17 0900 10/30/17 0346  Weight: 88.9 kg (196 lb) 86.5 kg (190 lb 11.2 oz) 89.8 kg (197 lb 15.6 oz)    Examination:  General exam: Appears calm and comfortable, somnolent, sleeping  Respiratory system: Clear to auscultation. Respiratory effort normal. Cardiovascular system: S1 & S2 heard, irregular rhythm, rate 100s. No JVD, murmurs, rubs, gallops or clicks. +trace pedal edema. Gastrointestinal system: Abdomen is nondistended, soft and nontender. No organomegaly or masses felt. Normal bowel sounds heard. Central nervous system: Sleeping soundly, difficult to arouse this morning  Extremities: Symmetric  Skin: No rashes, lesions or ulcers  Data Reviewed: I have personally reviewed following labs and imaging studies  CBC: Recent Labs  Lab 10/28/17 1916 10/28/17 2331 10/29/17 0316 10/29/17 1227 10/30/17 0346  WBC 7.0 7.0 7.1 7.0 10.9*  HGB 7.9* 8.1* 7.8* 7.8* 7.7*  HCT 24.5* 25.3* 24.5* 24.7* 23.7*  MCV 86.9 86.6 86.6 87.0 86.8  PLT 154 154 160 170 660   Basic Metabolic Panel: Recent Labs  Lab 10/25/17 1114 10/28/17 1916 10/29/17 0316 10/30/17 0346  NA 135 137 137 134*  K 3.4* 4.2 4.0 4.2  CL 98* 101 102 102  CO2 27 25 24 24   GLUCOSE 115* 120* 134* 146*  BUN 18 27* 24* 31*  CREATININE 1.63* 1.53* 1.53* 1.48*  CALCIUM 8.9 9.2 9.0 8.8*   GFR: Estimated Creatinine Clearance: 44 mL/min (A) (by C-G formula based on SCr of  1.48 mg/dL (H)). Liver Function Tests: No results for input(s): AST, ALT, ALKPHOS, BILITOT, PROT, ALBUMIN in the last 168 hours. No results for input(s): LIPASE, AMYLASE in the last 168 hours. No results for input(s): AMMONIA in the last 168 hours. Coagulation Profile: Recent Labs  Lab 10/28/17 2331  INR 1.58   Cardiac Enzymes: No results for input(s): CKTOTAL, CKMB, CKMBINDEX, TROPONINI in the last 168 hours. BNP (last 3 results) No results for input(s): PROBNP in the last 8760 hours. HbA1C: No results for input(s): HGBA1C in the last 72 hours. CBG: No results for input(s): GLUCAP in the last 168 hours. Lipid Profile: No results for input(s): CHOL, HDL, LDLCALC, TRIG, CHOLHDL, LDLDIRECT in the last 72 hours. Thyroid Function Tests: No results for input(s): TSH, T4TOTAL, FREET4, T3FREE, THYROIDAB in the last 72 hours. Anemia Panel: Recent Labs    10/28/17 2340  VITAMINB12 483  FOLATE 36.0  FERRITIN 102  TIBC 347  IRON 41*  RETICCTPCT 3.7*  Sepsis Labs: No results for input(s): PROCALCITON, LATICACIDVEN in the last 168 hours.  No results found for this or any previous visit (from the past 240 hour(s)).     Radiology Studies: Dg Chest 2 View  Result Date: 10/28/2017 CLINICAL DATA:  Cough and SOB x2 months. Hx of COPD, CAD, CHF, HTN. Smoker. EXAM: CHEST - 2 VIEW COMPARISON:  10/25/2016 FINDINGS: Heart size is accentuated by technique. There is diffuse prominence of interstitial markings with a basilar predominance. Small bilateral pleural effusions are again noted, possibly slightly increased. There is minimal bibasilar atelectasis. IMPRESSION: 1. Chronic interstitial lung disease. 2. Increasing bilateral pleural effusions and minimal bibasilar atelectasis. Electronically Signed   By: Nolon Nations M.D.   On: 10/28/2017 19:42      Scheduled Meds: . acidophilus  1 capsule Oral Daily  . atorvastatin  40 mg Oral q1800  . B-complex with vitamin C  1 tablet Oral Daily    . famotidine  10 mg Oral Daily  . ferrous sulfate  325 mg Oral BID WC  . folic acid  1 mg Oral Daily  . furosemide  40 mg Intravenous Daily  . ipratropium-albuterol  3 mL Nebulization TID  . LORazepam  0-4 mg Intravenous Q6H   Followed by  . [START ON 10/31/2017] LORazepam  0-4 mg Intravenous Q12H  . mouth rinse  15 mL Mouth Rinse BID  . metoprolol tartrate  12.5 mg Oral BID  . multivitamin with minerals  1 tablet Oral Daily  . omega-3 acid ethyl esters  1 g Oral Daily  . psyllium  1 packet Oral Daily  . sodium chloride flush  3 mL Intravenous Q12H  . thiamine  100 mg Oral Daily   Or  . thiamine  100 mg Intravenous Daily  . varenicline  1 mg Oral BID  . vitamin E  400 Units Oral Daily   Continuous Infusions: . sodium chloride       LOS: 1 day    Time spent: 35 minutes   Dessa Phi, DO [facility] [email or web address] Password [facility] 10/30/2017, 8:31 AM

## 2017-10-30 NOTE — Progress Notes (Signed)
Pt BP at 1628 was 94/63. Pt has his home BP cuff and asked me to check it on that to ensure accuracy, home BP cuff read 85/51. Pt endorses feeling slightly light headed. Agrees to not get up without assistance, daughter at bedside. Will make MD aware. Encouraged oral fluid intake.    Fritz Pickerel, RN

## 2017-10-31 ENCOUNTER — Ambulatory Visit: Payer: Self-pay

## 2017-10-31 DIAGNOSIS — D649 Anemia, unspecified: Secondary | ICD-10-CM

## 2017-10-31 DIAGNOSIS — I2510 Atherosclerotic heart disease of native coronary artery without angina pectoris: Secondary | ICD-10-CM

## 2017-10-31 DIAGNOSIS — J9621 Acute and chronic respiratory failure with hypoxia: Secondary | ICD-10-CM

## 2017-10-31 DIAGNOSIS — Z7901 Long term (current) use of anticoagulants: Secondary | ICD-10-CM

## 2017-10-31 DIAGNOSIS — J441 Chronic obstructive pulmonary disease with (acute) exacerbation: Secondary | ICD-10-CM

## 2017-10-31 DIAGNOSIS — I481 Persistent atrial fibrillation: Secondary | ICD-10-CM

## 2017-10-31 DIAGNOSIS — I4819 Other persistent atrial fibrillation: Secondary | ICD-10-CM

## 2017-10-31 LAB — CBC
HEMATOCRIT: 25.3 % — AB (ref 39.0–52.0)
HEMOGLOBIN: 7.9 g/dL — AB (ref 13.0–17.0)
MCH: 28 pg (ref 26.0–34.0)
MCHC: 31.2 g/dL (ref 30.0–36.0)
MCV: 89.7 fL (ref 78.0–100.0)
Platelets: 197 10*3/uL (ref 150–400)
RBC: 2.82 MIL/uL — AB (ref 4.22–5.81)
RDW: 19.6 % — ABNORMAL HIGH (ref 11.5–15.5)
WBC: 13.6 10*3/uL — AB (ref 4.0–10.5)

## 2017-10-31 LAB — BASIC METABOLIC PANEL
ANION GAP: 7 (ref 5–15)
BUN: 34 mg/dL — ABNORMAL HIGH (ref 6–20)
CO2: 27 mmol/L (ref 22–32)
Calcium: 8.8 mg/dL — ABNORMAL LOW (ref 8.9–10.3)
Chloride: 100 mmol/L — ABNORMAL LOW (ref 101–111)
Creatinine, Ser: 1.52 mg/dL — ABNORMAL HIGH (ref 0.61–1.24)
GFR calc Af Amer: 49 mL/min — ABNORMAL LOW (ref >60)
GFR, EST NON AFRICAN AMERICAN: 42 mL/min — AB (ref >60)
Glucose, Bld: 119 mg/dL — ABNORMAL HIGH (ref 65–99)
POTASSIUM: 4.3 mmol/L (ref 3.5–5.1)
SODIUM: 134 mmol/L — AB (ref 135–145)

## 2017-10-31 MED ORDER — FUROSEMIDE 40 MG PO TABS
40.0000 mg | ORAL_TABLET | Freq: Every day | ORAL | Status: DC
  Administered 2017-11-01 – 2017-11-03 (×3): 40 mg via ORAL
  Filled 2017-10-31 (×3): qty 1

## 2017-10-31 MED ORDER — METOPROLOL TARTRATE 25 MG PO TABS: 25.0000 mg | ORAL_TABLET | Freq: Two times a day (BID) | ORAL | Status: DC

## 2017-10-31 MED ORDER — METOPROLOL TARTRATE 25 MG PO TABS
25.0000 mg | ORAL_TABLET | Freq: Two times a day (BID) | ORAL | Status: DC
  Administered 2017-10-31 – 2017-11-02 (×4): 25 mg via ORAL
  Filled 2017-10-31 (×6): qty 1

## 2017-10-31 NOTE — Progress Notes (Signed)
Patient ID: Kevin Watkins, male   DOB: [birth year], 77 y.o.   MRN: [id number]    Progress Note   Subjective   HGb 7.9 this am-stable BP low - says this has been happening at home - and he feels lightheaded off and on and has fallen a few times  Breathing about the same as yesterday     Objective   Vital signs in last 24 hours: Temp:  [97.5 F (36.4 C)-99.6 F (37.6 C)] 99.6 F (37.6 C) (04/23 0417) Pulse Rate:  [73-107] 73 (04/22 2104) Resp:  [20-31] 28 (04/22 2104) BP: (86-95)/(57-70) 86/57 (04/23 0417) SpO2:  [95 %-98 %] 95 % (04/22 2104) Weight:  [193 lb 11.2 oz (87.9 kg)] 193 lb 11.2 oz (87.9 kg) (04/23 0417) Last BM Date: 10/29/17 General:    Older WM  in NAD up to chair - wife at bedside O2 at 3 l Heart: irr Regular rate and rhythm; no murmurs Lungs: Respirations even , decreased BS, no rales Abdomen:  Soft, nontender and nondistended. Normal bowel sounds. Extremities:  1+ Neurologic:  Alert and oriented,  grossly normal neurologically. Psych:  Cooperative. Normal mood and affect.  Intake/Output from previous day: 04/22 0701 - 04/23 0700 In: 125 [P.O.:125] Out: -  Intake/Output this shift: Total I/O In: 240 [P.O.:240] Out: -   Lab Results: Recent Labs    10/29/17 1227 10/30/17 0346 10/31/17 0438  WBC 7.0 10.9* 13.6*  HGB 7.8* 7.7* 7.9*  HCT 24.7* 23.7* 25.3*  PLT 170 184 197   BMET Recent Labs    10/29/17 0316 10/30/17 0346 10/31/17 0438  NA 137 134* 134*  K 4.0 4.2 4.3  CL 102 102 100*  CO2 24 24 27   GLUCOSE 134* 146* 119*  BUN 24* 31* 34*  CREATININE 1.53* 1.48* 1.52*  CALCIUM 9.0 8.8* 8.8*   LFT No results for input(s): PROT, ALBUMIN, AST, ALT, ALKPHOS, BILITOT, BILIDIR, IBILI in the last 72 hours. PT/INR Recent Labs    10/28/17 2331  LABPROT 18.8*  INR 1.58      Assessment / Plan:    #1 77 yo WM admitted 2 days ago with increased shortness of breath over the past few weeks, felt secondary to CHF exacerbation. He is being diuresed  and notes some mild improvement in dyspnea  #2 atrial fibrillation #3 chronic anticoagulation-on Eliquis-on hold since admit #4 COPD/chronic respiratory failure on chronic O2 #5 chronic kidney disease #6 coronary artery disease status post remote stent #7 peripheral vascular disease status post multiple interventions #8 chronic GERD #9 history of adenomatous colon polyps-last colonoscopy 2015 #10 chronic anemia, normocytic, progressive, borderline iron deficiency and heme positive on admission. Hemoglobin has been stable since admission, no evidence for active GI bleeding  Plan; continue to monitor hemoglobin, transfuse for hemoglobin 7 or less Continue PPI daily We are planning for colonoscopy and EGD this admission once he is optimized from a cardiopulmonary perspective. Patient is high risk and would require procedures to be done at the hospital even if planned outpatient and best to do while off anticoagulation here        Contact  Kevin Watkins, P.A.-C               [phone]      Principal Problem:   Acute on chronic respiratory failure with hypoxia [facility]) Active Problems:   CAD-RCA BMS 2001, low risk Myoview 2011   Tobacco abuse   Dyslipidemia, goal LDL below 70   Essential hypertension  Acute on chronic diastolic CHF (congestive heart failure) (HCC)   Paroxysmal atrial fibrillation (HCC)   Chronic anticoagulation   COPD with acute exacerbation (HCC)   CKD (chronic kidney disease), stage III (HCC)   Iron deficiency anemia   Pressure injury of skin   Heme positive stool   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

## 2017-10-31 NOTE — Progress Notes (Signed)
PROGRESS NOTE    Kevin Watkins  QQI:[id number] DOB: December 27, 1940 DOA: 10/28/2017 PCP: Ronita Hipps, MD     Brief Narrative:  Kevin Watkins is a 77 y.o. male with medical history significant of hypertension, hyperlipidemia, COPD, GERD, CAD, stent placement, dCHF, alcohol abuse, tobacco abuse, PVD, iron deficiency anemia, atrial fibrillation on Eliquis, CKD-3, who presents with shortness of breath. Patient states that he has been having shortness of breath in the past 2 months, which has been progressively getting worse. He does not have chest pain, fever or chills. Patient states that he has mild cough, 2 or 3 days each day, but most of time he coughs up dark colored material, which he thinks blood. Patient has mild leg edema. Patient does not have rectal bleeding, hematochezia, hematemesis, hematuria. He states that he stopped smoking currently he is taking Chantix. In the ED, he was found to have worsening anemia, Hgb 7.9. CXR revealed interstitial lung disease with bilateral pleural effusion and patient was admitted for further work up and evaluation. He was diuresed with IV lasix for CHF exacerbation and GI consulted for blood loss anemia.   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 Patient's weight has been stable per patient, but he has bilateral leg  edema, positive JVD and elevated BNP 427, consistent with CHF exacerbation -Uses 2L [place] O2 at baseline  Acute on chronic diastolic CHF -Echo with EF 60% -Lasix 40 mg IV daily -Daily weight, strict I/Os  -Continue metoprolol, dose increased this afternoon due to increased HR -Consult cardiology  Paroxysmal atrial fibrillation -CHA2DS2-VASc Score is 5 -Hold Eliquis -Continue metoprolol -HR elevated this morning, rates in the 110-130s. Already received 12.5mg  metoprolol morning dose, increase to 25mg  this afternoon  -Consult cardiology  Iron deficiency anemia with blood loss anemia  -Hgb 11.5 in 12/2016 --> 8.5 10/25/2017 --> 7.9 on admission. Patient denies any hematochezia, melenotic stool  -Hold Eliquis -Continue iron supplementation -Patient has been following with Dr. Nehemiah Settle in [facility].  His last colonoscopy was January 08, 2014. At that time, patient reports that polyps were removed and he was recommended for repeat colonoscopy in 3 years.  After discussion with his GI physician, they discussed that patient is a very poor candidate for further colonoscopy due to his medical comorbidities and repeat colonoscopy was deferred at that time. -GI consulted, planning for EGD and colonoscopy inpatient when CHF and HR better controlled   -Hgb stable at 7.9 today   Delirium -Had episode of agitation and confusion overnight 4/21. He received benadryl 50mg  for sleep (which he takes at home as well), went to sleep, then woke up in the middle of the night very confused and agitated.  -Hold off on additional doses of benadryl going forward  -Monitor mentation  -Resolved and back to baseline today   Hx  of CAD-RCA BMS 2001, low risk Myoview 2011 -Continue lipitor and metoprolol -Hold aspirin   Tobacco abuse and alcohol abuse -He stopped smoking currently taking Chantix  HLD -Continue lipitor, lovaza   HTN -Continue metoprolol -IV hydralazine prn  CKD stage III -Baseline  creatinine 1.5-1.7 -Stable    DVT prophylaxis: SCD  Code Status: Full Family Communication: Wife at bedside Disposition Plan: Pending Cardiology consult and endoscopy when cardiac issues more stable   Consultants:   GI   Cardiology   Procedures:   None   Antimicrobials:  Anti-infectives (From admission, onward)   None       Subjective: No new complaints today  Objective: Vitals:   10/30/17 1356 10/30/17 1628 10/30/17 2104 10/31/17 0417  BP:  94/63 95/61 (!) 86/57  Pulse:   73   Resp:  20 (!) 28   Temp:   98.2 F (36.8 C) 99.6 F (37.6 C)  TempSrc:   Oral Oral  SpO2: 98% 97% 95%   Weight:    87.9 kg (193 lb 11.2 oz)  Height:        Intake/Output Summary (Last 24 hours) at 10/31/2017 0941 Last data filed at 10/31/2017 0900 Gross per 24 hour  Intake 365 ml  Output -  Net 365 ml   Filed Weights   10/29/17 0900 10/30/17 0346 10/31/17 0417  Weight: 86.5 kg (190 lb 11.2 oz) 89.8 kg (197 lb 15.6 oz) 87.9 kg (193 lb 11.2 oz)   Examination: General exam: Appears calm and comfortable  Respiratory system: Clear to auscultation. Respiratory effort normal. Cardiovascular system: S1 & S2 heard, Irreg rhythm, rate 110-130s. No JVD, murmurs, rubs, gallops or clicks. +1 pedal edema. Gastrointestinal system: Abdomen is nondistended, soft and nontender. No organomegaly or masses felt. Normal bowel sounds heard. Central nervous system: Alert and oriented. No focal neurological deficits. Extremities: Symmetric 5 x 5 power. Skin: No rashes, lesions or ulcers Psychiatry: Judgement and insight appear normal. Mood & affect appropriate.    Data Reviewed: I have personally reviewed following labs and imaging studies  CBC: Recent Labs  Lab 10/28/17 2331 10/29/17 0316 10/29/17 1227 10/30/17 0346 10/31/17 0438  WBC 7.0 7.1 7.0 10.9* 13.6*  HGB 8.1* 7.8* 7.8* 7.7* 7.9*  HCT 25.3* 24.5* 24.7* 23.7* 25.3*  MCV 86.6 86.6 87.0 86.8 89.7  PLT 154 160 170 184 425   Basic  Metabolic Panel: Recent Labs  Lab 10/25/17 1114 10/28/17 1916 10/29/17 0316 10/30/17 0346 10/31/17 0438  NA 135 137 137 134* 134*  K 3.4* 4.2 4.0 4.2 4.3  CL 98* 101 102 102 100*  CO2 27 25 24 24 27   GLUCOSE 115* 120* 134* 146* 119*  BUN 18 27* 24* 31* 34*  CREATININE 1.63* 1.53* 1.53* 1.48* 1.52*  CALCIUM 8.9 9.2 9.0 8.8* 8.8*   GFR: Estimated Creatinine Clearance: 42.4 mL/min (A) (by C-G formula based on SCr of 1.52 mg/dL (H)). Liver Function Tests: No results for input(s): AST, ALT, ALKPHOS, BILITOT, PROT, ALBUMIN in the last 168 hours. No results for input(s): LIPASE, AMYLASE in the last 168 hours. No results for input(s): AMMONIA in the last 168 hours. Coagulation Profile: Recent Labs  Lab 10/28/17 2331  INR 1.58   Cardiac Enzymes: No results for input(s): CKTOTAL, CKMB, CKMBINDEX, TROPONINI in the last 168 hours. BNP (last 3 results) No results for input(s): PROBNP in the last 8760 hours. HbA1C: No results for input(s): HGBA1C in the last 72 hours. CBG: No results for input(s): GLUCAP in the last 168  hours. Lipid Profile: No results for input(s): CHOL, HDL, LDLCALC, TRIG, CHOLHDL, LDLDIRECT in the last 72 hours. Thyroid Function Tests: No results for input(s): TSH, T4TOTAL, FREET4, T3FREE, THYROIDAB in the last 72 hours. Anemia Panel: Recent Labs    10/28/17 2340  VITAMINB12 483  FOLATE 36.0  FERRITIN 102  TIBC 347  IRON 41*  RETICCTPCT 3.7*   Sepsis Labs: No results for input(s): PROCALCITON, LATICACIDVEN in the last 168 hours.  No results found for this or any previous visit (from the past 240 hour(s)).     Radiology Studies: No results found.    Scheduled Meds: . acidophilus  1 capsule Oral Daily  . atorvastatin  40 mg Oral q1800  . B-complex with vitamin C  1 tablet Oral Daily  . ferrous sulfate  325 mg Oral BID WC  . folic acid  1 mg Oral Daily  . furosemide  40 mg Intravenous Daily  . ipratropium-albuterol  3 mL Nebulization TID  .  LORazepam  0-4 mg Intravenous Q12H  . mouth rinse  15 mL Mouth Rinse BID  . metoprolol tartrate  25 mg Oral BID  . multivitamin with minerals  1 tablet Oral Daily  . omega-3 acid ethyl esters  1 g Oral Daily  . pantoprazole  40 mg Oral Q0600  . psyllium  1 packet Oral Daily  . sodium chloride flush  3 mL Intravenous Q12H  . thiamine  100 mg Oral Daily   Or  . thiamine  100 mg Intravenous Daily  . varenicline  1 mg Oral BID  . vitamin E  400 Units Oral Daily   Continuous Infusions: . sodium chloride       LOS: 2 days    Time spent: 35 minutes   Dessa Phi, DO [facility] [email or web address] Password [facility] 10/31/2017, 9:41 AM

## 2017-10-31 NOTE — Consult Note (Addendum)
Cardiology Consultation:   Patient ID: Kevin Watkins; [id number]; 05/08/1941   Admit date: 10/28/2017 Date of Consult: 10/31/2017  Primary Care Provider: Ronita Hipps, MD Primary Cardiologist: Quay Burow, MD  Primary Electrophysiologist:     Patient Profile:   Kevin Watkins is a 77 y.o. male with a hx of CAD s/p PCI with BMS to RCA 06/2000, negative myoview 2001, s/p stent to right subclavian (2001) and innominate (2003), stent to right renal artery, persistent atrial fibrillation on eliquis and rate controlled with lopressor, HTN, HLD, CKD stage III, PVD, former smoker, COPD on home oxygen, and alcohol dependence who is being seen today for the evaluation of CHF exacerbation and Afib at the request of Dr. Maylene Roes.  History of Present Illness:   Kevin Watkins has a complex vascular history. He was last seen in clinic with Dr. Gwenlyn Found on 06/09/17. At that time, he was in his usual state of health.   He presented to [facility] with onset of shortness of breath and acute on chronic respiratory failure, likely multifactrorial. He was found to have anemia. Hb 7.9 from 17.9. Pt was also found to have an elevated BNP of 427 with a sCr of 1.53. CXR with chronic interstitial lung disease and bilateral pleural effusions. He received IV diuresis for suspected acute on chronic diastolic heart failure. Hb continued to drop with nadir 7.7. No blood products have been transfused, but eliquis has been held. HR have been high-normal in the 100-110s on home lopressor 12.5 mg BID. Primary team increased this to 25 mg BID for better rate control.   Cardiology was asked to consult for help with volume management. On my interview, he states that he came into the hospital because his pressure was too low and he was feeling dizzy and that he might pass out. He states his breathing and orthopnea have been at baseline. He denies extra lower extremity swelling. He weighs every day and takes extra lasix as instructed by Dr. Gwenlyn Found. This  afternoon he feels weaker and more fatigued. He denies chest pain, changes in breathing, increased lower extremity edema, palpitations, and syncope.    Past Medical History:  Diagnosis Date  . CAD in native artery 06/2000   BMS to the RCA; known 60% mid dominant RCA stenosis and 50-60% OM 2 branch stenosis normal LV function. Last Myoview May 2011 negative for ischemia.  . CHF (congestive heart failure) ([place])   . COPD (chronic obstructive pulmonary disease) ([place])   . Dyslipidemia, goal LDL below 70   . Dyspnea   . Edema   . EtOH dependence ([place])    Family reported  . Hypertension   . Medical history non-contributory   . Oxygen deficiency   . PAD (peripheral artery disease) ([facility])    iliac, SCA, Innominate, and renal PTA  . Tobacco abuse     Past Surgical History:  Procedure Laterality Date  . ANGIOPLASTY  '01, '03   innominate artery PTA  . Carotid Doppler  2013   R vertebral appears occluded, R subclavian with prox occlusion v. high grade stenosis; R bulb 70-99% diameter reduction; R prox ICA 50-69% diameter reduction; L bulb & prox ICA 50-69% diameter reduction; L subclavian with >50% diameter reduction  . CORONARY ANGIOPLASTY WITH STENT PLACEMENT  06/26/2000    BMS to RCA; residual 60%mid dominant RCA stent, 50-60% OM2 stenosis (Dr. Adora Fridge)  . NM MYOCAR PERF WALL MOTION  2011   persantine myoview - normal perfusion in all regions, EF  64%  . RENAL ANGIOGRAM  09/22/2005   stent to right renal artery with 5x12 Genesis on Aviator stent balloon pre-mount (Dr. Adora Fridge)  . Renal Doppler  2013   R prox renal artery stent with 60-99% in-stent restenosis; L prox renal artery =/> 60% diameter reduction; R & L kidneys normal in size  . SUBCLAVIAN ANGIOGRAM  07/15/2004   stent to right subclavian (7x18 Genesis) and innominate (8x24 Genesis), with known ostial left common carotid stenosis and left subclavian stenosis as well (Dr. Adora Fridge)  . TRANSTHORACIC ECHOCARDIOGRAM  2013   EF=>55%,  mild conc LVH; LA mod dilated; RA mild-mod dilated; mild mitral annular calcif, mild MR; mild TR with normal RSVP  . Upper Extremity Arterial Doppler  2013   R prox subclavian artery stent with prox occlusion v. high grade stenosis; R vertebral appears occluded; L subclavian with >60% diameter reduction     Home Medications:  Prior to Admission medications   Medication Sig Start Date End Date Taking? Authorizing Provider  apixaban (ELIQUIS) 5 MG TABS tablet Take 1 tablet (5 mg total) by mouth 2 (two) times daily. 06/05/17  Yes Lorretta Harp, MD  aspirin EC 81 MG tablet Take 81 mg by mouth daily.   Yes [provider]  atorvastatin (LIPITOR) 40 MG tablet Take 40 mg by mouth daily at 6 PM.  12/26/12  Yes [provider]  B Complex-Biotin-FA (VITAMIN B50 COMPLEX PO) Take 1 tablet by mouth daily.   Yes [provider]  diphenhydrAMINE (BENADRYL) 25 MG tablet Take 25-50 mg by mouth See admin instructions. Take two capsules (50 mg) by mouth daily at bedtime, may also take one or two capsule (25-50 mg) during the night as needed for sleep   Yes [provider]  ferrous sulfate 325 (65 FE) MG tablet Take 1 tablet (325 mg total) by mouth 2 (two) times daily with a meal. 10/25/17  Yes Sherwood Gambler, MD  folic acid (FOLVITE) 353 MCG tablet Take 800 mcg by mouth daily.   Yes [provider]  furosemide (LASIX) 40 MG tablet Take 1 tablet by mouth daily. May take extra 40 mg daily in afternoon for weight gain of 3lbs Patient taking differently: Take 40 mg by mouth See admin instructions. Take one tablet (40 mg) by mouth every morning,  Take an extra tablet (40 mg) with lunch as needed for leg swelling or weight gain of 2-3 lbs overnight 06/06/17  Yes Lorretta Harp, MD  Garlic 6144 MG CAPS Take 1,000 mg by mouth daily.    Yes [provider]  guaifenesin (ROBITUSSIN) 100 MG/5ML syrup Take by mouth every 4 (four) hours as needed for cough.   Yes  [provider]  ipratropium-albuterol (DUONEB) 0.5-2.5 (3) MG/3ML SOLN Take 3 mLs by nebulization 4 (four) times daily.    Yes [provider]  Lactobacillus (PROBIOTIC ACIDOPHILUS PO) Take 1 tablet by mouth daily.   Yes [provider]  metoprolol tartrate (LOPRESSOR) 25 MG tablet Take 12.5 mg by mouth 2 (two) times daily.  08/16/17  Yes [provider]  Omega-3 Fatty Acids (FISH OIL) 1200 MG CAPS Take 1,200 mg by mouth 2 (two) times daily.   Yes [provider]  OVER THE COUNTER MEDICATION Place 2 drops under the tongue 2 (two) times daily. CBD tincture/hemp flower extract   Yes [provider]  OXYGEN Inhale 2 L into the lungs continuous.   Yes [provider]  polyethylene glycol (MIRALAX /  GLYCOLAX) packet Take by mouth See admin instructions. Mix 3 teaspoonsful (15 mls) in water and drink every evening   Yes [provider]  potassium chloride SA (K-DUR,KLOR-CON) 20 MEQ tablet Take 1 tablet (20 mEq total) by mouth daily. 10/25/17  Yes Sherwood Gambler, MD  PROAIR HFA 108 [phone] Base) MCG/ACT inhaler Inhale 2 puffs into the lungs every 6 (six) hours as needed for wheezing or shortness of breath. 12/24/16  Yes Alphonzo Grieve, MD  ranitidine (ZANTAC) 75 MG tablet Take 75 mg by mouth daily.   Yes [provider]  thiamine 100 MG tablet Take 1 tablet (100 mg total) by mouth daily. 12/28/16  Yes Lorretta Harp, MD  varenicline (CHANTIX PAK) 0.5 MG X 11 & 1 MG X 42 tablet Take 0.5 mg by mouth See admin instructions. Take one tablet (0.5 mg)  by mouth once daily for 3 days, then increase to one tablet (0.5 mg)  twice daily for 4 days, then increase to  one tablet (1 mg) twice daily for 21 days   Yes [provider]  vitamin E (VITAMIN E) 400 UNIT capsule Take 400 Units by mouth daily.   Yes [provider]  Wheat Dextrin (BENEFIBER PO) Take 20 mLs by mouth daily. Mix 4 teaspoon (20 ml) in water and drink daily    Yes [provider]  guaiFENesin-dextromethorphan (ROBITUSSIN DM) 100-10 MG/5ML syrup Take 5 mLs by mouth every 4 (four) hours as needed for cough. Patient not taking: Reported on 10/28/2017 12/24/16   Alphonzo Grieve, MD    Inpatient Medications: Scheduled Meds: . acidophilus  1 capsule Oral Daily  . atorvastatin  40 mg Oral q1800  . B-complex with vitamin C  1 tablet Oral Daily  . ferrous sulfate  325 mg Oral BID WC  . folic acid  1 mg Oral Daily  . furosemide  40 mg Intravenous Daily  . ipratropium-albuterol  3 mL Nebulization TID  . mouth rinse  15 mL Mouth Rinse BID  . metoprolol tartrate  25 mg Oral BID  . multivitamin with minerals  1 tablet Oral Daily  . omega-3 acid ethyl esters  1 g Oral Daily  . pantoprazole  40 mg Oral Q0600  . psyllium  1 packet Oral Daily  . sodium chloride flush  3 mL Intravenous Q12H  . thiamine  100 mg Oral Daily  . varenicline  1 mg Oral BID  . vitamin E  400 Units Oral Daily   Continuous Infusions: . sodium chloride     PRN Meds: sodium chloride, acetaminophen, albuterol, guaiFENesin, ondansetron (ZOFRAN) IV, polyethylene glycol, sodium chloride flush  Allergies:   No Known Allergies  Social History:   Social History   Socioeconomic History  . Marital status: Married    Spouse name: Not on file  . Number of children: Not on file  . Years of education: Not on file  . Highest education level: Not on file  Occupational History  . Not on file  Social Needs  . Financial resource strain: Not on file  . Food insecurity:    Worry: Not on file    Inability: Not on file  . Transportation needs:    Medical: Not on file    Non-medical: Not on file  Tobacco Use  . Smoking status: Former Smoker    Packs/day: 1.00    Types: Cigarettes    Last attempt to quit: 12/17/2016    Years since quitting: 0.8  . Smokeless tobacco: Never  Used  . Tobacco comment: pt states he has quit x 3 weeks   Substance and Sexual Activity  . Alcohol use:  Not Currently    Alcohol/week: 12.6 oz    Types: 21 Cans of beer per week    Comment: last beer x 1 week   . Drug use: No  . Sexual activity: Not on file  Lifestyle  . Physical activity:    Days per week: Not on file    Minutes per session: Not on file  . Stress: Not on file  Relationships  . Social connections:    Talks on phone: Not on file    Gets together: Not on file    Attends religious service: Not on file    Active member of club or organization: Not on file    Attends meetings of clubs or organizations: Not on file    Relationship status: Not on file  . Intimate partner violence:    Fear of current or ex partner: Not on file    Emotionally abused: Not on file    Physically abused: Not on file    Forced sexual activity: Not on file  Other Topics Concern  . Not on file  Social History Narrative  . Not on file    Family History:    Family History  Problem Relation Age of Onset  . Heart disease Mother        hx CABG  . Heart disease Father        hx of CABG  . Cancer Sister   . Heart attack Brother   . Heart disease Brother      ROS:  Please see the history of present illness.   All other ROS reviewed and negative.     Physical Exam/Data:   Vitals:   10/30/17 1628 10/30/17 2104 10/31/17 0417 10/31/17 0919  BP: 94/63 95/61 (!) 86/57 93/66  Pulse:  73    Resp: 20 (!) 28  (!) 29  Temp:  98.2 F (36.8 C) 99.6 F (37.6 C)   TempSrc:  Oral Oral   SpO2: 97% 95%    Weight:   193 lb 11.2 oz (87.9 kg)   Height:        Intake/Output Summary (Last 24 hours) at 10/31/2017 1154 Last data filed at 10/31/2017 0900 Gross per 24 hour  Intake 240 ml  Output -  Net 240 ml   Filed Weights   10/29/17 0900 10/30/17 0346 10/31/17 0417  Weight: 190 lb 11.2 oz (86.5 kg) 197 lb 15.6 oz (89.8 kg) 193 lb 11.2 oz (87.9 kg)   Body mass index is 30.34 kg/m.  General:  Well nourished, well developed, in no acute distress HEENT: normal Neck: no JVD Vascular: + carotid  bruits Cardiac:  Irregular rhythm, regular rate, no murmur Lungs:  respirations unlabored, crackles in bases Abd: soft, nontender, no hepatomegaly  Ext: + edema Musculoskeletal:  No deformities, BUE and BLE strength normal and equal Skin: warm and dry  Neuro:  CNs 2-12 intact, no focal abnormalities noted Psych:  Normal affect   EKG:  The EKG was personally reviewed and demonstrates:  Afib Telemetry:  Telemetry was personally reviewed and demonstrates:  Afib with rates 90-120s  Relevant CV Studies:  Echo 10/29/17: Study Conclusions - Left ventricle: The cavity size was normal. Wall thickness was   increased increased in a pattern of mild to moderate LVH.   Systolic function was normal. The estimated ejection fraction was   in  the range of 60% to 65%. Wall motion was normal; there were no   regional wall motion abnormalities. The study is not technically   sufficient to allow evaluation of LV diastolic function. - Aortic valve: Mildly calcified annulus. Trileaflet; normal   thickness leaflets. - Mitral valve: Mildly to moderately calcified annulus. Normal   thickness leaflets . - Left atrium: The atrium was mildly to moderately dilated.  Laboratory Data:  Chemistry Recent Labs  Lab 10/29/17 0316 10/30/17 0346 10/31/17 0438  NA 137 134* 134*  K 4.0 4.2 4.3  CL 102 102 100*  CO2 24 24 27   GLUCOSE 134* 146* 119*  BUN 24* 31* 34*  CREATININE 1.53* 1.48* 1.52*  CALCIUM 9.0 8.8* 8.8*  GFRNONAA 42* 44* 42*  GFRAA 48* 50* 49*  ANIONGAP 11 8 7     No results for input(s): PROT, ALBUMIN, AST, ALT, ALKPHOS, BILITOT in the last 168 hours. Hematology Recent Labs  Lab 10/29/17 1227 10/30/17 0346 10/31/17 0438  WBC 7.0 10.9* 13.6*  RBC 2.84* 2.73* 2.82*  HGB 7.8* 7.7* 7.9*  HCT 24.7* 23.7* 25.3*  MCV 87.0 86.8 89.7  MCH 27.5 28.2 28.0  MCHC 31.6 32.5 31.2  RDW 19.2* 19.5* 19.6*  PLT 170 184 197   Cardiac EnzymesNo results for input(s): TROPONINI in the last 168 hours.    Recent Labs  Lab 10/28/17 1924  TROPIPOC 0.01    BNP Recent Labs  Lab 10/28/17 2230  BNP 427.9*    DDimer No results for input(s): DDIMER in the last 168 hours.  Radiology/Studies:  Dg Chest 2 View  Result Date: 10/28/2017 CLINICAL DATA:  Cough and SOB x2 months. Hx of COPD, CAD, CHF, HTN. Smoker. EXAM: CHEST - 2 VIEW COMPARISON:  10/25/2016 FINDINGS: Heart size is accentuated by technique. There is diffuse prominence of interstitial markings with a basilar predominance. Small bilateral pleural effusions are again noted, possibly slightly increased. There is minimal bibasilar atelectasis. IMPRESSION: 1. Chronic interstitial lung disease. 2. Increasing bilateral pleural effusions and minimal bibasilar atelectasis. Electronically Signed   By: Nolon Nations M.D.   On: 10/28/2017 19:42    Assessment and Plan:   1. Acute on chronic diastolic heart failure - primary team started 40 mg IV lasix daily, home dose is 40 mg PO daily - he his overall net negative 600 cc, strict I&Os not charted - weight is 193 lbs from 196 lbs on admission - last clinic weight was 204 lbs, unsure of dry weight - he is not extremely volume overloaded, will transition IV lasix to PO dosing - sCr 1.52, baseline appears to be 1.5, K stable - elevated BNP in the setting of CKD can be difficult to interpret   2. Paroxysmal atrial fibrillation - on eliquis at home - rate controlled with lopressor 12.5 mg BID, primary team has increased this to 25 mg BID - increased heart rate likely due to underlying anemia - if additional rate control is needed, may consider digoxin   3. HTN/hypottension - lopressor 25 mg BID - pressures have been marginal in the 80-90s - hold hypertensives - agree with continuing lopressor if pressure tolerates   SOB, acute on chronic respiratory failure is likely multifactorial given his anemia, high-normal heart rate, hypotension, and diastolic dysfunction. He does not appear  extremely volume overloaded. Would proceed with EGD/colonsocopy per GI to fix underlying anemia. Will control heart rate in the acute setting with higher dose of lopressor as this does not decrease BP as much as other  AV nodal agents. Not a great candidate for amiodarone given his interstitial lung disease. HR in the 100s, tolerating this well. No further increase in lopressor at this time. Agree with lasix today and tomorrow. Will follow fluid status and HR.    For questions or updates, please contact [place] Please consult [email or web address] for contact info under Cardiology/STEMI.   Signed, Ledora Bottcher, [place]  10/31/2017 11:54 AM   Patient seen and examined. Agree with assessment and plan.  Kevin Watkins is a 77 year old gentleman who is followed by Dr. Gwenlyn Found for the past 20 years.  He has undergone multiple procedures with bare-metal stenting to his RCA in 2001, stents to his right subclavian, innominate,right renal artery.   The patient has a 65-year history of tobacco use and quit smoking approximately 3 months ago.  He has COPD on home oxygen, history of EtOH use, hypertension, hyperlipidemia, and stage III chronic kidney disease.  He presented with acute shortness of breath on his chronic respiratory symptoms which was felt to be multifactorial.  He was found to be anemic with a hemoglobin of  7.9 decreased from 17.9.  BMP was mildly elevated consistent more with diastolic hypertension.  Chest x-ray revealed chronic interstitial lung disease with bilateral pleural effusions.  He has been treated with IV diuresis. He denies any anginal type symptoms.  His major complaint now is that of weakness and fatigability which I suspect is contributed predominantly by his recent anemia.  He denies any anginal type symptomatology.  On physical exam his blood pressure is low at 93/66.  Sclerae anicteric.  There is no lid lag.  Mallampati scale is a 3.  He had bilateral carotid bruits.  He had decreased  breath sounds with very faint expiratory wheezing intermittently.  Rhythm was regular to mildly tachycardic at 98 to 105 bpm.  There was irregularly irregular consistent with atrial fibrillation.  There is a 1/6 systolic murmur and a faint diastolic murmur.  There was no chest wall tenderness.  He had moderate diastases recti.  Abdomen was nontender. Bilateral femoral bruits.  There was trivial ankle edema.  Neurologic exam is grossly nonfocal.  ECG shows atrial fibrillation 93 bpm. Echo Doppler study of October 29, 2017 shows an EF of 60 to 65% with mild to moderate LVH.  Due to his atrial fibrillation diastolic function was not able to be assessed.  There was aortic sclerosis, mitral annular calcification, and mild to moderate left atrial dilatation.  Serum creatinine is stable at 1.5.  His metoprolol dose has been increased to 25 mg twice a day for improved rate control of his atrial fibrillation.  With his permanent AF, no need for antiarrhythmic treatment.  Since his blood pressure is low, this will limit additional rate control medication such as Cardizem but consider possible addition of low-dose digoxin at 0.0625 mg.  With change from intravenous furosemide to oral therapy.  Once blood pressure stabilizes, okay to proceed with planned colonoscopy and endoscopy.  Troy Sine, MD, [facility] 10/31/2017 1:46 PM

## 2017-11-01 ENCOUNTER — Encounter: Payer: Self-pay | Admitting: *Deleted

## 2017-11-01 DIAGNOSIS — I5033 Acute on chronic diastolic (congestive) heart failure: Secondary | ICD-10-CM

## 2017-11-01 DIAGNOSIS — E785 Hyperlipidemia, unspecified: Secondary | ICD-10-CM

## 2017-11-01 DIAGNOSIS — R195 Other fecal abnormalities: Secondary | ICD-10-CM

## 2017-11-01 LAB — BASIC METABOLIC PANEL
Anion gap: 9 (ref 5–15)
BUN: 26 mg/dL — ABNORMAL HIGH (ref 6–20)
CALCIUM: 8.8 mg/dL — AB (ref 8.9–10.3)
CHLORIDE: 99 mmol/L — AB (ref 101–111)
CO2: 28 mmol/L (ref 22–32)
CREATININE: 1.42 mg/dL — AB (ref 0.61–1.24)
GFR, EST AFRICAN AMERICAN: 53 mL/min — AB (ref >60)
GFR, EST NON AFRICAN AMERICAN: 46 mL/min — AB (ref >60)
Glucose, Bld: 104 mg/dL — ABNORMAL HIGH (ref 65–99)
Potassium: 4.1 mmol/L (ref 3.5–5.1)
Sodium: 136 mmol/L (ref 135–145)

## 2017-11-01 LAB — CBC
HCT: 23.2 % — ABNORMAL LOW (ref 39.0–52.0)
Hemoglobin: 7.4 g/dL — ABNORMAL LOW (ref 13.0–17.0)
MCH: 28 pg (ref 26.0–34.0)
MCHC: 31.9 g/dL (ref 30.0–36.0)
MCV: 87.9 fL (ref 78.0–100.0)
PLATELETS: 173 10*3/uL (ref 150–400)
RBC: 2.64 MIL/uL — AB (ref 4.22–5.81)
RDW: 19.8 % — ABNORMAL HIGH (ref 11.5–15.5)
WBC: 7.5 10*3/uL (ref 4.0–10.5)

## 2017-11-01 LAB — PREPARE RBC (CROSSMATCH)

## 2017-11-01 MED ORDER — PEG-KCL-NACL-NASULF-NA ASC-C 100 G PO SOLR
0.5000 | Freq: Once | ORAL | Status: AC
  Administered 2017-11-01: 100 g via ORAL
  Filled 2017-11-01: qty 1

## 2017-11-01 MED ORDER — DILTIAZEM HCL 60 MG PO TABS
30.0000 mg | ORAL_TABLET | Freq: Two times a day (BID) | ORAL | Status: DC
  Administered 2017-11-01 – 2017-11-02 (×3): 30 mg via ORAL
  Filled 2017-11-01 (×3): qty 1

## 2017-11-01 MED ORDER — PEG-KCL-NACL-NASULF-NA ASC-C 100 G PO SOLR
0.5000 | Freq: Once | ORAL | Status: AC
  Administered 2017-11-02: 100 g via ORAL

## 2017-11-01 MED ORDER — PEG-KCL-NACL-NASULF-NA ASC-C 100 G PO SOLR: 1.0000 | Freq: Once | ORAL | Status: DC

## 2017-11-01 MED ORDER — SODIUM CHLORIDE 0.9 % IV SOLN: Freq: Once | INTRAVENOUS | Status: DC

## 2017-11-01 NOTE — Progress Notes (Signed)
Daily Rounding Note  11/01/2017, 11:03 AM  LOS: 3 days   SUBJECTIVE:   Still with DOE if walks bed to bathroom.  No SOB at rest.  Blood in sputum not present today, was present through yesterday.  Stool this AM.  They have been dark due to taking po Iron (new this admission)   OBJECTIVE:         Vital signs in last 24 hours:    Temp:  [98 F (36.7 C)] 98 F (36.7 C) (04/24 0456) Pulse Rate:  [98-118] 109 (04/24 0808) Resp:  [17-25] 24 (04/24 0808) BP: (90-144)/(56-116) 90/60 (04/24 0456) SpO2:  [90 %-97 %] 94 % (04/24 0808) Weight:  [193 lb 3.2 oz (87.6 kg)] 193 lb 3.2 oz (87.6 kg) (04/24 0456) Last BM Date: 10/31/17 Filed Weights   10/30/17 0346 10/31/17 0417 11/01/17 0456  Weight: 197 lb 15.6 oz (89.8 kg) 193 lb 11.2 oz (87.9 kg) 193 lb 3.2 oz (87.6 kg)   General: chronically ill looking.  NAD   Heart: RRR Chest: crackles in bases, upper airway wheezes.  No dyspnea at rest.  No cough Abdomen: soft, NT, ND.  Active BS  Extremities: no CCE Neuro/Psych:  Oriented x 3.  No gross deficits or weakness.     Intake/Output from previous day: 04/23 0701 - 04/24 0700 In: 960 [P.O.:960] Out: 1360 [Urine:1360]  Intake/Output this shift: No intake/output data recorded.  Lab Results: Recent Labs    10/30/17 0346 10/31/17 0438 11/01/17 0240  WBC 10.9* 13.6* 7.5  HGB 7.7* 7.9* 7.4*  HCT 23.7* 25.3* 23.2*  PLT 184 197 173   BMET Recent Labs    10/30/17 0346 10/31/17 0438 11/01/17 0240  NA 134* 134* 136  K 4.2 4.3 4.1  CL 102 100* 99*  CO2 24 27 28   GLUCOSE 146* 119* 104*  BUN 31* 34* 26*  CREATININE 1.48* 1.52* 1.42*  CALCIUM 8.8* 8.8* 8.8*   LFT No results for input(s): PROT, ALBUMIN, AST, ALT, ALKPHOS, BILITOT, BILIDIR, IBILI in the last 72 hours. PT/INR No results for input(s): LABPROT, INR in the last 72 hours. Hepatitis Panel No results for input(s): HEPBSAG, HCVAB, HEPAIGM, HEPBIGM in the last 72  hours.  Studies/Results: No results found.   Scheduled Meds: . acidophilus  1 capsule Oral Daily  . atorvastatin  40 mg Oral q1800  . B-complex with vitamin C  1 tablet Oral Daily  . ferrous sulfate  325 mg Oral BID WC  . folic acid  1 mg Oral Daily  . furosemide  40 mg Oral Daily  . ipratropium-albuterol  3 mL Nebulization TID  . mouth rinse  15 mL Mouth Rinse BID  . metoprolol tartrate  25 mg Oral BID  . multivitamin with minerals  1 tablet Oral Daily  . omega-3 acid ethyl esters  1 g Oral Daily  . pantoprazole  40 mg Oral Q0600  . psyllium  1 packet Oral Daily  . sodium chloride flush  3 mL Intravenous Q12H  . thiamine  100 mg Oral Daily  . varenicline  1 mg Oral BID  . vitamin E  400 Units Oral Daily   Continuous Infusions: . sodium chloride    . sodium chloride     PRN Meds:.sodium chloride, acetaminophen, albuterol, guaiFENesin, ondansetron (ZOFRAN) IV, polyethylene glycol, sodium chloride flush   ASSESMENT:   *   Acute on chronic anemia.  Hgb dropped again, 1 U PRBC ordered, this is  the first so far.  On oral iron.   Low iron and low iron sat.  Ferritin 102.     *  FOBT + stool, no overt bleeding, melena.   Hx adenomatous colon polyps in 2015 with Dr Melina Copa.      *  CHF flare.    *  A fib , chronic Eliquis.  On hold since 4/22.      PLAN   *  Stop oral iron in anticipation of colonoscopy in near future, makes for challenging prep.    *  Timing of colonoscopy/egd per Dr Hilarie Fredrickson.      Azucena Freed  11/01/2017, 11:03 AM Phone [phone]

## 2017-11-01 NOTE — Progress Notes (Signed)
Physical Therapy Treatment Patient Details Name: Kevin Watkins MRN: [id number] DOB: 1941/02/06 Today's Date: 11/01/2017    History of Present Illness Pt. is a 77 y.o. M with significant PMH of hypertension, hyperlipidemia, COPD, GERD, CAD, stent placement, CHF, alcohol abuse, tobacco abuse, PVD, iron deficiency anemia, atrial fibrillation on Eliquis, CKD-3, who presents with shortness of breath. Chest x-ray showing interstitial lung disease with bilateral pleural effusion.     PT Comments    Patient is progressing very well towards their physical therapy goals. Increased ambulation distance to 200 feet using Rollator with increased gait speed. Needs reinforcement for safety precautions using Rollator with utilizing locks prior to transferring. Patient with dyspnea on exertion (3/4 on DOE scale); unable to obtain accurate O2 saturation but returned to 96% on 2L upon return to room.    Follow Up Recommendations  Home health PT;Supervision for mobility/OOB     Equipment Recommendations  None recommended by PT    Recommendations for Other Services       Precautions / Restrictions Precautions Precautions: Fall Restrictions Weight Bearing Restrictions: No    Mobility  Bed Mobility               General bed mobility comments: Sitting EOB  Transfers Overall transfer level: Needs assistance Equipment used: 4-wheeled walker Transfers: Sit to/from Stand Sit to Stand: Supervision         General transfer comment: Supervision for safety due to decreased safety awareness. Turned and sat down in Rollator before locking brakes.  Ambulation/Gait Ambulation/Gait assistance: Min guard Ambulation Distance (Feet): 200 Feet Assistive device: 4-wheeled walker Gait Pattern/deviations: Step-through pattern;Decreased dorsiflexion - right;Decreased dorsiflexion - left     General Gait Details: Patient with increased gait speed using Rollator. DOE 3/4   Stairs              Wheelchair Mobility    Modified Rankin (Stroke Patients Only)       Balance Overall balance assessment: Mild deficits observed, not formally tested                                          Cognition Arousal/Alertness: Awake/alert Behavior During Therapy: WFL for tasks assessed/performed Overall Cognitive Status: Within Functional Limits for tasks assessed                                 General Comments: HOH      Exercises      General Comments General comments (skin integrity, edema, etc.): Patient wife present.      Pertinent Vitals/Pain Pain Assessment: No/denies pain    Home Living                      Prior Function            PT Goals (current goals can now be found in the care plan section) Acute Rehab PT Goals Patient Stated Goal: Be able to go eat breakfast with friends PT Goal Formulation: With patient Time For Goal Achievement: 11/13/17 Potential to Achieve Goals: Good Progress towards PT goals: Progressing toward goals    Frequency    Min 3X/week      PT Plan Current plan remains appropriate    Co-evaluation              AM-PAC PT "6  Clicks" Daily Activity  Outcome Measure  Difficulty turning over in bed (including adjusting bedclothes, sheets and blankets)?: None Difficulty moving from lying on back to sitting on the side of the bed? : A Little Difficulty sitting down on and standing up from a chair with arms (e.g., wheelchair, bedside commode, etc,.)?: A Little Help needed moving to and from a bed to chair (including a wheelchair)?: A Little Help needed walking in hospital room?: A Little Help needed climbing 3-5 steps with a railing? : A Lot 6 Click Score: 18    End of Session Equipment Utilized During Treatment: Gait belt;Oxygen Activity Tolerance: Patient tolerated treatment well Patient left: in chair;with call bell/phone within reach;with family/visitor present Nurse  Communication: Mobility status PT Visit Diagnosis: Unsteadiness on feet (R26.81);Difficulty in walking, not elsewhere classified (R26.2)     Time: 1950-9326 PT Time Calculation (min) (ACUTE ONLY): 20 min  Charges:  $Gait Training: 8-22 mins                    G Codes:       Ellamae Sia, PT, DPT Acute Rehabilitation Services  Pager: [phone]    Willy Eddy 11/01/2017, 3:47 PM

## 2017-11-01 NOTE — Consult Note (Addendum)
   [facility] Inpatient Consult   11/01/2017  Kevin Watkins 10/18/1940 [id number]    Made aware of hospitalization by [place]. Multiple attempts to reach Kevin Watkins prior to hospitalization, by [place], were unsuccessful.   Went to bedside to speak with Kevin Watkins and wife. Explained [facility] Care Management program. He is agreeable and [facility] Care Management written consent obtained. [facility] Care Management folder provided.   Explained that [facility] will not interfere or replace home health should he have it.  Kevin Watkins lives with his wife Kevin Watkins.  Confirmed Primary Care Provider is Dr. Helene Kelp [facility] listed as doing transition of care call). Best contact number for Kevin Watkins is home [phone] and cell [phone]. Denies having any concerns with transportation or medication.  Discussed [facility] follow up for COPD disease and symptom management.   Will make referral to [facility] for COPD. Kevin Watkins also has a high risk of unplanned readmission score of 26%. Medical history of COPD, HLD, HTN, CAD, anemia, PVD, afib.  Will make inpatient RNCM aware [facility] to follow post discharge.    Marthenia Rolling, MSN-Ed, RN,BSN [facility] Liaison [phone]

## 2017-11-01 NOTE — H&P (View-Only) (Signed)
Daily Rounding Note  11/01/2017, 11:03 AM  LOS: 3 days   SUBJECTIVE:   Still with DOE if walks bed to bathroom.  No SOB at rest.  Blood in sputum not present today, was present through yesterday.  Stool this AM.  They have been dark due to taking po Iron (new this admission)   OBJECTIVE:         Vital signs in last 24 hours:    Temp:  [98 F (36.7 C)] 98 F (36.7 C) (04/24 0456) Pulse Rate:  [98-118] 109 (04/24 0808) Resp:  [17-25] 24 (04/24 0808) BP: (90-144)/(56-116) 90/60 (04/24 0456) SpO2:  [90 %-97 %] 94 % (04/24 0808) Weight:  [193 lb 3.2 oz (87.6 kg)] 193 lb 3.2 oz (87.6 kg) (04/24 0456) Last BM Date: 10/31/17 Filed Weights   10/30/17 0346 10/31/17 0417 11/01/17 0456  Weight: 197 lb 15.6 oz (89.8 kg) 193 lb 11.2 oz (87.9 kg) 193 lb 3.2 oz (87.6 kg)   General: chronically ill looking.  NAD   Heart: RRR Chest: crackles in bases, upper airway wheezes.  No dyspnea at rest.  No cough Abdomen: soft, NT, ND.  Active BS  Extremities: no CCE Neuro/Psych:  Oriented x 3.  No gross deficits or weakness.     Intake/Output from previous day: 04/23 0701 - 04/24 0700 In: 960 [P.O.:960] Out: 1360 [Urine:1360]  Intake/Output this shift: No intake/output data recorded.  Lab Results: Recent Labs    10/30/17 0346 10/31/17 0438 11/01/17 0240  WBC 10.9* 13.6* 7.5  HGB 7.7* 7.9* 7.4*  HCT 23.7* 25.3* 23.2*  PLT 184 197 173   BMET Recent Labs    10/30/17 0346 10/31/17 0438 11/01/17 0240  NA 134* 134* 136  K 4.2 4.3 4.1  CL 102 100* 99*  CO2 24 27 28   GLUCOSE 146* 119* 104*  BUN 31* 34* 26*  CREATININE 1.48* 1.52* 1.42*  CALCIUM 8.8* 8.8* 8.8*   LFT No results for input(s): PROT, ALBUMIN, AST, ALT, ALKPHOS, BILITOT, BILIDIR, IBILI in the last 72 hours. PT/INR No results for input(s): LABPROT, INR in the last 72 hours. Hepatitis Panel No results for input(s): HEPBSAG, HCVAB, HEPAIGM, HEPBIGM in the last 72  hours.  Studies/Results: No results found.   Scheduled Meds: . acidophilus  1 capsule Oral Daily  . atorvastatin  40 mg Oral q1800  . B-complex with vitamin C  1 tablet Oral Daily  . ferrous sulfate  325 mg Oral BID WC  . folic acid  1 mg Oral Daily  . furosemide  40 mg Oral Daily  . ipratropium-albuterol  3 mL Nebulization TID  . mouth rinse  15 mL Mouth Rinse BID  . metoprolol tartrate  25 mg Oral BID  . multivitamin with minerals  1 tablet Oral Daily  . omega-3 acid ethyl esters  1 g Oral Daily  . pantoprazole  40 mg Oral Q0600  . psyllium  1 packet Oral Daily  . sodium chloride flush  3 mL Intravenous Q12H  . thiamine  100 mg Oral Daily  . varenicline  1 mg Oral BID  . vitamin E  400 Units Oral Daily   Continuous Infusions: . sodium chloride    . sodium chloride     PRN Meds:.sodium chloride, acetaminophen, albuterol, guaiFENesin, ondansetron (ZOFRAN) IV, polyethylene glycol, sodium chloride flush   ASSESMENT:   *   Acute on chronic anemia.  Hgb dropped again, 1 U PRBC ordered, this is  the first so far.  On oral iron.   Low iron and low iron sat.  Ferritin 102.     *  FOBT + stool, no overt bleeding, melena.   Hx adenomatous colon polyps in 2015 with Dr Melina Copa.      *  CHF flare.    *  A fib , chronic Eliquis.  On hold since 4/22.      PLAN   *  Stop oral iron in anticipation of colonoscopy in near future, makes for challenging prep.    *  Timing of colonoscopy/egd per Dr Hilarie Fredrickson.      Azucena Freed  11/01/2017, 11:03 AM Phone [phone]

## 2017-11-01 NOTE — Progress Notes (Addendum)
PROGRESS NOTE    Kevin Watkins  NFA:[id number] DOB: 1941-05-10 DOA: 10/28/2017 PCP: Ronita Hipps, MD     Brief Narrative:  Kevin Watkins is a 77 y.o. male with medical history significant of hypertension, hyperlipidemia, COPD, GERD, CAD, stent placement, dCHF, alcohol abuse, tobacco abuse, PVD, iron deficiency anemia, atrial fibrillation on Eliquis, CKD-3, who presents with shortness of breath. Patient states that he has been having shortness of breath in the past 2 months, which has been progressively getting worse. He does not have chest pain, fever or chills. Patient states that he has mild cough, 2 or 3 days each day, but most of time he coughs up dark colored material, which he thinks blood. Patient has mild leg edema. Patient does not have rectal bleeding, hematochezia, hematemesis, hematuria. He states that he stopped smoking currently he is taking Chantix. In the ED, he was found to have worsening anemia, Hgb 7.9. CXR revealed interstitial lung disease with bilateral pleural effusion and patient was admitted for further work up and evaluation. He was diuresed with IV lasix for CHF exacerbation and GI consulted for blood loss anemia.   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 Patient's weight has been  stable per patient, but he has bilateral leg edema, positive JVD and elevated BNP 427, consistent with CHF exacerbation -Uses 2L [place] O2 at baseline  Acute on chronic diastolic CHF -Echo with EF 60% -Lasix 40 mg PO  -Daily weight, strict I/Os  -Continue metoprolol -Cardiology following   Paroxysmal atrial fibrillation -CHA2DS2-VASc Score is 5 -Hold Eliquis -Continue metoprolol -Cardiology following   Iron deficiency anemia with blood loss anemia  -Hgb 11.5 in 12/2016 --> 8.5 10/25/2017 --> 7.9 on admission. Patient denies any hematochezia, melenotic stool  -Hold Eliquis -Continue iron supplementation -Patient has been following with Dr. Nehemiah Settle in  GI.  His last colonoscopy was January 08, 2014. At that time, patient reports that polyps were removed and he was recommended for repeat colonoscopy in 3 years.  After discussion with his GI physician, they discussed that patient is a very poor candidate for further colonoscopy due to his medical comorbidities and repeat colonoscopy was deferred at that time. -GI consulted, planning for EGD and colonoscopy inpatient when CHF and HR better controlled   -Although Hgb remains stable in range of 7 and patient is not actively bleeding, due to his elevated HR > 100 and marginal BP 90/60, I think it's reasonable to give him a unit of pRBC to stabilize his hemodynamics further in order to prep him for endoscopy procedures. 1u pRBC ordered today.   Delirium -Had episode of agitation and confusion overnight 4/21. He received benadryl 50mg  for sleep (which he takes at home as well), went to sleep, then woke up in the middle of the  night very confused and agitated.  -Hold off on additional doses of benadryl going forward  -Resolved and back to baseline   Hx of CAD-RCA BMS 2001, low risk Myoview 2011 -Continue lipitor and metoprolol -Hold aspirin   Tobacco abuse and alcohol abuse -He stopped smoking currently taking Chantix  HLD -Continue  lipitor, lovaza   HTN -Continue metoprolol  CKD stage III -Baseline creatinine 1.5-1.7 -Remains stable    DVT prophylaxis: SCD  Code Status: Full Family Communication: Wife at bedside Disposition Plan: 1u pRBC today, endoscopy once HR and BP improved    Consultants:   GI   Cardiology   Procedures:   None   Antimicrobials:  Anti-infectives (From admission, onward)   None       Subjective: No new complaints today, states he is restless and wants to go home. Sitting at bedside eating breakfast. Breathing is stable, no chest pain or abdominal pain.   Objective: Vitals:   10/31/17 2235 10/31/17 2237 11/01/17 0456 11/01/17 0808  BP: (!) 90/56 (!) 144/116 90/60   Pulse: (!) 113 (!) 118 98 (!) 109  Resp: (!) 25  (!) 25 (!) 24  Temp:   98 F (36.7 C)   TempSrc:   Oral   SpO2: 90%  93% 94%  Weight:   87.6 kg (193 lb 3.2 oz)   Height:        Intake/Output Summary (Last 24 hours) at 11/01/2017 0834 Last data filed at 11/01/2017 0457 Gross per 24 hour  Intake 960 ml  Output 1360 ml  Net -400 ml   Filed Weights   10/30/17 0346 10/31/17 0417 11/01/17 0456  Weight: 89.8 kg (197 lb 15.6 oz) 87.9 kg (193 lb 11.2 oz) 87.6 kg (193 lb 3.2 oz)   Examination: General exam: Appears calm and comfortable  Respiratory system: Clear to auscultation. Respiratory effort normal. Cardiovascular system: S1 & S2 heard, Irreg rhythm rate 100-110s. No JVD, murmurs, rubs, gallops or clicks. Trace pedal edema. Gastrointestinal system: Abdomen is nondistended, soft and nontender. No organomegaly or masses felt. Normal bowel sounds heard. Central nervous system: Alert and oriented. No focal neurological deficits. Extremities: Symmetric 5 x 5 power. Skin: No rashes, lesions or ulcers Psychiatry: Judgement and insight appear normal. Mood & affect appropriate.     Data Reviewed: I have personally reviewed following labs and imaging studies  CBC: Recent Labs  Lab 10/29/17 0316  10/29/17 1227 10/30/17 0346 10/31/17 0438 11/01/17 0240  WBC 7.1 7.0 10.9* 13.6* 7.5  HGB 7.8* 7.8* 7.7* 7.9* 7.4*  HCT 24.5* 24.7* 23.7* 25.3* 23.2*  MCV 86.6 87.0 86.8 89.7 87.9  PLT 160 170 184 197 703   Basic Metabolic Panel: Recent Labs  Lab 10/28/17 1916 10/29/17 0316 10/30/17 0346 10/31/17 0438 11/01/17 0240  NA 137 137 134* 134* 136  K 4.2 4.0 4.2 4.3 4.1  CL 101 102 102 100* 99*  CO2 25 24 24 27 28   GLUCOSE 120* 134* 146* 119* 104*  BUN 27* 24* 31* 34* 26*  CREATININE 1.53* 1.53* 1.48* 1.52* 1.42*  CALCIUM 9.2 9.0 8.8* 8.8* 8.8*   GFR: Estimated Creatinine Clearance: 45.3 mL/min (A) (by C-G formula based on SCr of 1.42 mg/dL (H)). Liver Function Tests: No results for input(s): AST, ALT, ALKPHOS, BILITOT, PROT, ALBUMIN in the last 168 hours. No results for input(s): LIPASE, AMYLASE in the last 168 hours. No results for input(s): AMMONIA in the last 168 hours. Coagulation Profile: Recent Labs  Lab 10/28/17 2331  INR 1.58  Cardiac Enzymes: No results for input(s): CKTOTAL, CKMB, CKMBINDEX, TROPONINI in the last 168 hours. BNP (last 3 results) No results for input(s): PROBNP in the last 8760 hours. HbA1C: No results for input(s): HGBA1C in the last 72 hours. CBG: No results for input(s): GLUCAP in the last 168 hours. Lipid Profile: No results for input(s): CHOL, HDL, LDLCALC, TRIG, CHOLHDL, LDLDIRECT in the last 72 hours. Thyroid Function Tests: No results for input(s): TSH, T4TOTAL, FREET4, T3FREE, THYROIDAB in the last 72 hours. Anemia Panel: No results for input(s): VITAMINB12, FOLATE, FERRITIN, TIBC, IRON, RETICCTPCT in the last 72 hours. Sepsis Labs: No results for input(s): PROCALCITON, LATICACIDVEN in the last 168 hours.  No results found for this or any previous visit (from the past 240 hour(s)).     Radiology Studies: No results found.    Scheduled Meds: . acidophilus  1 capsule Oral Daily  . atorvastatin  40 mg Oral q1800  .  B-complex with vitamin C  1 tablet Oral Daily  . ferrous sulfate  325 mg Oral BID WC  . folic acid  1 mg Oral Daily  . furosemide  40 mg Oral Daily  . ipratropium-albuterol  3 mL Nebulization TID  . mouth rinse  15 mL Mouth Rinse BID  . metoprolol tartrate  25 mg Oral BID  . multivitamin with minerals  1 tablet Oral Daily  . omega-3 acid ethyl esters  1 g Oral Daily  . pantoprazole  40 mg Oral Q0600  . psyllium  1 packet Oral Daily  . sodium chloride flush  3 mL Intravenous Q12H  . thiamine  100 mg Oral Daily  . varenicline  1 mg Oral BID  . vitamin E  400 Units Oral Daily   Continuous Infusions: . sodium chloride    . sodium chloride       LOS: 3 days    Time spent: 25 minutes   Dessa Phi, DO [facility] [email or web address] Password [facility] 11/01/2017, 8:34 AM

## 2017-11-01 NOTE — Progress Notes (Addendum)
Progress Note  Patient Name: Kevin Watkins Date of Encounter: 11/01/2017  Primary Cardiologist: Gwenlyn Found  Subjective   Appears more dyspneic today  Inpatient Medications    Scheduled Meds: . acidophilus  1 capsule Oral Daily  . atorvastatin  40 mg Oral q1800  . B-complex with vitamin C  1 tablet Oral Daily  . ferrous sulfate  325 mg Oral BID WC  . folic acid  1 mg Oral Daily  . furosemide  40 mg Oral Daily  . ipratropium-albuterol  3 mL Nebulization TID  . mouth rinse  15 mL Mouth Rinse BID  . metoprolol tartrate  25 mg Oral BID  . multivitamin with minerals  1 tablet Oral Daily  . omega-3 acid ethyl esters  1 g Oral Daily  . pantoprazole  40 mg Oral Q0600  . psyllium  1 packet Oral Daily  . sodium chloride flush  3 mL Intravenous Q12H  . thiamine  100 mg Oral Daily  . varenicline  1 mg Oral BID  . vitamin E  400 Units Oral Daily   Continuous Infusions: . sodium chloride    . sodium chloride     PRN Meds: sodium chloride, acetaminophen, albuterol, guaiFENesin, ondansetron (ZOFRAN) IV, polyethylene glycol, sodium chloride flush   Vital Signs    Vitals:   11/01/17 1103 11/01/17 1120 11/01/17 1146 11/01/17 1202  BP: 93/65  (!) 82/65 101/69  Pulse: 88 94 (!) 42 83  Resp: 17 20 20  (!) 23  Temp: 98.8 F (37.1 C)  98.4 F (36.9 C) 98.3 F (36.8 C)  TempSrc: Oral  Oral Oral  SpO2: 98% 98% 96% 97%  Weight:      Height:        Intake/Output Summary (Last 24 hours) at 11/01/2017 1215 Last data filed at 11/01/2017 0457 Gross per 24 hour  Intake 720 ml  Output 1360 ml  Net -640 ml    I/O since admission:  -850  Filed Weights   10/30/17 0346 10/31/17 0417 11/01/17 0456  Weight: 197 lb 15.6 oz (89.8 kg) 193 lb 11.2 oz (87.9 kg) 193 lb 3.2 oz (87.6 kg)    Telemetry    AF rate ~ 100 - Personally Reviewed  ECG    ECG (independently read by me): AF at 93  Physical Exam   BP 101/69   Pulse 83   Temp 98.3 F (36.8 C) (Oral)   Resp (!) 23   Ht 5\' 7"  (1.702  m)   Wt 193 lb 3.2 oz (87.6 kg)   SpO2 97%   BMI 30.26 kg/m  General: Alert, oriented, mildly dyspneic at rest Skin: normal turgor, no rashes, warm and dry HEENT: Normocephalic, atraumatic. Pupils equal round and reactive to light; sclera anicteric; extraocular muscles intact;  Nose without nasal septal hypertrophy Mouth/Parynx benign; Mallinpatti scale 3 Neck: No JVD, no carotid bruits; normal carotid upstroke Lungs: decreased BS no wheezing or rales Chest wall: without tenderness to palpitation Heart: PMI not displaced, irreg, irreg 100 - 105, s1 s2 normal, 1/6 systolic murmur, no diastolic murmur, no rubs, gallops, thrills, or heaves Abdomen: soft, nontender; no hepatosplenomehaly, BS+; abdominal aorta nontender and not dilated by palpation. Back: no CVA tenderness Pulses 2+ Musculoskeletal: full range of motion, normal strength, no joint deformities Extremities: no clubbing cyanosis or edema, Homan's sign negative  Neurologic: grossly nonfocal; Cranial nerves grossly wnl Psychologic: Normal mood and affect   Labs    Chemistry Recent Labs  Lab 10/30/17 0346 10/31/17 0438 11/01/17  0240  NA 134* 134* 136  K 4.2 4.3 4.1  CL 102 100* 99*  CO2 24 27 28   GLUCOSE 146* 119* 104*  BUN 31* 34* 26*  CREATININE 1.48* 1.52* 1.42*  CALCIUM 8.8* 8.8* 8.8*  GFRNONAA 44* 42* 46*  GFRAA 50* 49* 53*  ANIONGAP 8 7 9      Hematology Recent Labs  Lab 10/30/17 0346 10/31/17 0438 11/01/17 0240  WBC 10.9* 13.6* 7.5  RBC 2.73* 2.82* 2.64*  HGB 7.7* 7.9* 7.4*  HCT 23.7* 25.3* 23.2*  MCV 86.8 89.7 87.9  MCH 28.2 28.0 28.0  MCHC 32.5 31.2 31.9  RDW 19.5* 19.6* 19.8*  PLT 184 197 173    Cardiac EnzymesNo results for input(s): TROPONINI in the last 168 hours.  Recent Labs  Lab 10/28/17 1924  TROPIPOC 0.01     BNP Recent Labs  Lab 10/28/17 2230  BNP 427.9*     DDimer No results for input(s): DDIMER in the last 168 hours.   Lipid Panel      Component Value Date/Time     TRIG 99 12/22/2016 0621    Radiology    No results found.  Cardiac Studies   ------------------------------------------------------------------- 10/29/17 ECHO Study Conclusions  - Left ventricle: The cavity size was normal. Wall thickness was   increased increased in a pattern of mild to moderate LVH.   Systolic function was normal. The estimated ejection fraction was   in the range of 60% to 65%. Wall motion was normal; there were no   regional wall motion abnormalities. The study is not technically   sufficient to allow evaluation of LV diastolic function. - Aortic valve: Mildly calcified annulus. Trileaflet; normal   thickness leaflets. - Mitral valve: Mildly to moderately calcified annulus. Normal   thickness leaflets . - Left atrium: The atrium was mildly to moderately dilated.  Patient Profile     Kevin Watkins is a 77 y.o. male with a hx of CAD s/p PCI with BMS to RCA 06/2000, negative myoview 2001, s/p stent to right subclavian (2001) and innominate (2003), stent to right renal artery, persistent atrial fibrillation on eliquis and rate controlled with lopressor, HTN, HLD, CKD stage III, PVD, former smoker, COPD on home oxygen, and alcohol dependence who was seen for evaluation of CHF exacerbation and Afib at the request of Dr. Maylene Roes.  Assessment & Plan    1. Acute on chronic diastolic heart failure:  EF 60 - 65% more dyspneic today. AF rate 100 range. Contributed by anemia with HH 7.4/23.2.    2. AF: rate 100 on metoprolol 25 mg bid; will try adding cardizem at 30 mg q 12 hrs as BP allows and titrate as needed to q 8 hr  3. Acute on chronic anemia:  With dyspnea would benefit from PRBC Tx which has been ordered; may need lasix post transfusion.  FOBT +; Eliquis on hold.  For future coloscopy once more stable.  4. HLD: on atorva/lovaza  Signed, Troy Sine, MD, [facility] 11/01/2017, 12:15 PM

## 2017-11-02 ENCOUNTER — Encounter (HOSPITAL_COMMUNITY): Payer: Self-pay

## 2017-11-02 ENCOUNTER — Inpatient Hospital Stay (HOSPITAL_COMMUNITY): Payer: PPO | Admitting: Anesthesiology

## 2017-11-02 ENCOUNTER — Encounter (HOSPITAL_COMMUNITY)
Admission: EM | Disposition: A | Discharge status: Home Health | Payer: Self-pay | Source: Home / Self Care | Attending: Internal Medicine

## 2017-11-02 DIAGNOSIS — D123 Benign neoplasm of transverse colon: Secondary | ICD-10-CM

## 2017-11-02 HISTORY — PX: ESOPHAGOGASTRODUODENOSCOPY: SHX5428

## 2017-11-02 HISTORY — PX: COLONOSCOPY WITH PROPOFOL: SHX5780

## 2017-11-02 LAB — BPAM RBC
BLOOD PRODUCT EXPIRATION DATE: 201905202359
ISSUE DATE / TIME: 201904241051
UNIT TYPE AND RH: 8400

## 2017-11-02 LAB — BASIC METABOLIC PANEL
ANION GAP: 10 (ref 5–15)
BUN: 22 mg/dL — ABNORMAL HIGH (ref 6–20)
CHLORIDE: 100 mmol/L — AB (ref 101–111)
CO2: 25 mmol/L (ref 22–32)
CREATININE: 1.41 mg/dL — AB (ref 0.61–1.24)
Calcium: 8.4 mg/dL — ABNORMAL LOW (ref 8.9–10.3)
GFR calc non Af Amer: 46 mL/min — ABNORMAL LOW (ref >60)
GFR, EST AFRICAN AMERICAN: 54 mL/min — AB (ref >60)
Glucose, Bld: 112 mg/dL — ABNORMAL HIGH (ref 65–99)
Potassium: 3.5 mmol/L (ref 3.5–5.1)
Sodium: 135 mmol/L (ref 135–145)

## 2017-11-02 LAB — CBC
HCT: 26.1 % — ABNORMAL LOW (ref 39.0–52.0)
HEMOGLOBIN: 8.4 g/dL — AB (ref 13.0–17.0)
MCH: 28 pg (ref 26.0–34.0)
MCHC: 32.2 g/dL (ref 30.0–36.0)
MCV: 87 fL (ref 78.0–100.0)
Platelets: 158 10*3/uL (ref 150–400)
RBC: 3 MIL/uL — AB (ref 4.22–5.81)
RDW: 18.5 % — ABNORMAL HIGH (ref 11.5–15.5)
WBC: 7.2 10*3/uL (ref 4.0–10.5)

## 2017-11-02 LAB — TYPE AND SCREEN
ABO/RH(D): AB POS
ANTIBODY SCREEN: NEGATIVE
Unit division: 0

## 2017-11-02 SURGERY — EGD (ESOPHAGOGASTRODUODENOSCOPY)
Anesthesia: Monitor Anesthesia Care

## 2017-11-02 MED ORDER — APIXABAN 5 MG PO TABS
5.0000 mg | ORAL_TABLET | Freq: Two times a day (BID) | ORAL | Status: DC
  Administered 2017-11-02 – 2017-11-03 (×2): 5 mg via ORAL
  Filled 2017-11-02 (×2): qty 1

## 2017-11-02 MED ORDER — PROPOFOL 500 MG/50ML IV EMUL
INTRAVENOUS | Status: DC | PRN
  Administered 2017-11-02: 100 ug/kg/min via INTRAVENOUS

## 2017-11-02 MED ORDER — LACTATED RINGERS IV SOLN
INTRAVENOUS | Status: DC
  Administered 2017-11-02: 11:00:00 via INTRAVENOUS

## 2017-11-02 MED ORDER — DILTIAZEM HCL 60 MG PO TABS
30.0000 mg | ORAL_TABLET | Freq: Three times a day (TID) | ORAL | Status: DC
  Administered 2017-11-02 – 2017-11-03 (×3): 30 mg via ORAL
  Filled 2017-11-02 (×3): qty 1

## 2017-11-02 MED ORDER — LACTATED RINGERS IV SOLN
INTRAVENOUS | Status: DC | PRN
  Administered 2017-11-02: 11:00:00 via INTRAVENOUS

## 2017-11-02 MED ORDER — SODIUM CHLORIDE 0.9 % IV SOLN
INTRAVENOUS | Status: DC
  Administered 2017-11-02: 04:00:00 via INTRAVENOUS

## 2017-11-02 MED ORDER — ASPIRIN EC 81 MG PO TBEC
81.0000 mg | DELAYED_RELEASE_TABLET | Freq: Every day | ORAL | Status: DC
  Administered 2017-11-03: 81 mg via ORAL
  Filled 2017-11-02: qty 1

## 2017-11-02 SURGICAL SUPPLY — 22 items

## 2017-11-02 NOTE — Anesthesia Preprocedure Evaluation (Addendum)
Anesthesia Evaluation  Patient identified by MRN, date of birth, ID band Patient awake    Reviewed: Allergy & Precautions, NPO status , Patient's Chart, lab work & pertinent test results  History of Anesthesia Complications Negative for: history of anesthetic complications  Airway Mallampati: I  TM Distance: >3 FB Neck ROM: Full    Dental  (+) Edentulous Upper, Dental Advisory Given, Edentulous Lower   Pulmonary COPD, former smoker,    Pulmonary exam normal        Cardiovascular hypertension, Pt. on home beta blockers + CAD and +CHF  Normal cardiovascular exam+ dysrhythmias Atrial Fibrillation   Study Conclusions  - Left ventricle: The cavity size was normal. Wall thickness was increased increased in a pattern of mild to moderate LVH. Systolic function was normal. The estimated ejection fraction was in the range of 60% to 65%. Wall motion was normal; there were no regional wall motion abnormalities. The study is not technically sufficient to allow evaluation of LV diastolic function. - Aortic valve: Mildly calcified annulus. Trileaflet; normal thickness leaflets. - Mitral valve: Mildly to moderately calcified annulus. Normal thickness leaflets . - Left atrium: The atrium was mildly to moderately dilated.     Neuro/Psych negative neurological ROS  negative psych ROS   GI/Hepatic negative GI ROS, Neg liver ROS,   Endo/Other  negative endocrine ROS  Renal/GU Renal InsufficiencyRenal disease  negative genitourinary   Musculoskeletal negative musculoskeletal ROS (+)   Abdominal   Peds negative pediatric ROS (+)  Hematology negative hematology ROS (+)   Anesthesia Other Findings   Reproductive/Obstetrics negative OB ROS                            Anesthesia Physical Anesthesia Plan  ASA: III  Anesthesia Plan: MAC   Post-op Pain Management:    Induction:   PONV Risk Score and  Plan: 2 and Ondansetron and Propofol infusion  Airway Management Planned: Natural Airway  Additional Equipment:   Intra-op Plan:   Post-operative Plan:   Informed Consent: I have reviewed the patients History and Physical, chart, labs and discussed the procedure including the risks, benefits and alternatives for the proposed anesthesia with the patient or authorized representative who has indicated his/her understanding and acceptance.   Dental advisory given  Plan Discussed with: CRNA, Anesthesiologist and Surgeon  Anesthesia Plan Comments:        Anesthesia Quick Evaluation

## 2017-11-02 NOTE — Interval H&P Note (Signed)
History and Physical Interval Note: For EGD and colon today to eval heme + stools and anemia. HIGHER THAN BASELINE RISK.The nature of the procedure, as well as the risks, benefits, and alternatives were carefully and thoroughly reviewed with the patient. Ample time for discussion and questions allowed. The patient understood, was satisfied, and agreed to proceed.   CBC Latest Ref Rng & Units 11/02/2017 11/01/2017 10/31/2017  WBC 4.0 - 10.5 K/uL 7.2 7.5 13.6(H)  Hemoglobin 13.0 - 17.0 g/dL 8.4(L) 7.4(L) 7.9(L)  Hematocrit 39.0 - 52.0 % 26.1(L) 23.2(L) 25.3(L)  Platelets 150 - 400 K/uL 158 173 197       11/02/2017 11:41 AM  Kevin Watkins  has presented today for surgery, with the diagnosis of Heme positive stool, iron deficiency anemia  The various methods of treatment have been discussed with the patient and family. After consideration of risks, benefits and other options for treatment, the patient has consented to  Procedure(s): ESOPHAGOGASTRODUODENOSCOPY (EGD) (N/A) COLONOSCOPY WITH PROPOFOL (N/A) as a surgical intervention .  The patient's history has been reviewed, patient examined, no change in status, stable for surgery.  I have reviewed the patient's chart and labs.  Questions were answered to the patient's satisfaction.     [name] Chyanna Flock

## 2017-11-02 NOTE — Op Note (Signed)
[facility] Patient Name: Kevin Watkins Procedure Date : 11/02/2017 MRN: [id number] Attending MD: Jerene Bears , MD Date of Birth: Nov 25, 1940 CSN: [id number] Age: 77 Admit Type: Inpatient Procedure:                Upper GI endoscopy Indications:              Iron deficiency anemia secondary to chronic blood                            loss, Heme positive stool Providers:                [name]. Hilarie Fredrickson, MD, Cleda Daub, RN, Laurena Spies, Technician, Eligha Bridegroom CRNA, CRNA Referring MD:             [facility] Medicines:                Monitored Anesthesia Care Complications:            No immediate complications. Estimated Blood Loss:     Estimated blood loss: none. Procedure:                Pre-Anesthesia Assessment:                           - Prior to the procedure, a History and Physical                            was performed, and patient medications and                            allergies were reviewed. The patient's tolerance of                            previous anesthesia was also reviewed. The risks                            and benefits of the procedure and the sedation                            options and risks were discussed with the patient.                            All questions were answered, and informed consent                            was obtained. Prior Anticoagulants: The patient has                            taken Eliquis (apixaban), last dose was 4 days                            prior to procedure. ASA Grade Assessment: III - A  patient with severe systemic disease. After                            reviewing the risks and benefits, the patient was                            deemed in satisfactory condition to undergo the                            procedure.                           After obtaining informed consent, the endoscope was                            passed  under direct vision. Throughout the                            procedure, the patient's blood pressure, pulse, and                            oxygen saturations were monitored continuously. The                            Endoscope was introduced through the mouth, and                            advanced to the second part of duodenum. The upper                            GI endoscopy was accomplished without difficulty.                            The patient tolerated the procedure well. Scope In: Scope Out: Findings:      The esophagus was normal.      The stomach was normal.      The examined duodenum was normal. Impression:               - Normal esophagus.                           - Normal stomach.                           - Normal examined duodenum.                           - No specimens collected. Moderate Sedation:      N/A Recommendation:           - Return patient to hospital ward for ongoing care.                           - See the other procedure note for documentation of  additional recommendations. Procedure Code(s):        --- Professional ---                           [phone], Esophagogastroduodenoscopy, flexible,                            transoral; diagnostic, including collection of                            specimen(s) by brushing or washing, when performed                            (separate procedure) Diagnosis Code(s):        --- Professional ---                           D50.0, Iron deficiency anemia secondary to blood                            loss (chronic)                           R19.5, Other fecal abnormalities CPT copyright 2017 American [employer] Association. All rights reserved. The codes documented in this report are preliminary and upon coder review may  be revised to meet current compliance requirements. Jerene Bears, MD 11/02/2017 12:16:17 PM This report has been signed electronically. Number of Addenda: 0

## 2017-11-02 NOTE — Op Note (Signed)
[facility] Patient Name: Kevin Watkins Procedure Date : 11/02/2017 MRN: [id number] Attending MD: Jerene Bears , MD Date of Birth: 1940/09/19 CSN: [id number] Age: 77 Admit Type: Inpatient Procedure:                Colonoscopy Indications:              Heme positive stool, anemia, history of colon                            polyps, last colonoscopy around 2015 Providers:                [name]. Hilarie Fredrickson, MD, Cleda Daub, RN, Laurena Spies, Technician, Eligha Bridegroom CRNA, CRNA Referring MD:             [facility] Medicines:                Monitored Anesthesia Care Complications:            No immediate complications. Estimated Blood Loss:     Estimated blood loss was minimal. Procedure:                Pre-Anesthesia Assessment:                           - Prior to the procedure, a History and Physical                            was performed, and patient medications and                            allergies were reviewed. The patient's tolerance of                            previous anesthesia was also reviewed. The risks                            and benefits of the procedure and the sedation                            options and risks were discussed with the patient.                            All questions were answered, and informed consent                            was obtained. Prior Anticoagulants: The patient has                            taken Eliquis (apixaban), last dose was 4 days                            prior to procedure. ASA Grade Assessment: III - A  patient with severe systemic disease. After                            reviewing the risks and benefits, the patient was                            deemed in satisfactory condition to undergo the                            procedure.                           After obtaining informed consent, the colonoscope                            was  passed under direct vision. Throughout the                            procedure, the patient's blood pressure, pulse, and                            oxygen saturations were monitored continuously. The                            EC-[id number] ([id number]) scope was introduced through                            the anus and advanced to the terminal ileum. The                            colonoscopy was performed without difficulty. The                            patient tolerated the procedure well. The quality                            of the bowel preparation was good. The terminal                            ileum, ileocecal valve, appendiceal orifice, and                            rectum were photographed. Scope In: 12:18:13 PM Scope Out: 12:36:27 PM Scope Withdrawal Time: 0 hours 12 minutes 8 seconds  Total Procedure Duration: 0 hours 18 minutes 14 seconds  Findings:      The digital rectal exam was normal.      A 10 mm polyp was found in the hepatic flexure. The polyp was       semi-pedunculated. The polyp was removed with a hot snare. Resection and       retrieval were complete. To stop active bleeding and given probably of       resumption of anticoagulation, two hemostatic clips were successfully       placed. There was no bleeding at the end of the maneuver.      3  mucosal tattoos were seen in the distal transverse colon. No residual       polyp tissue was found at the tattoo sites.      Multiple small-mouthed diverticula were found in the ascending colon and       cecum.      Internal hemorrhoids were found during retroflexion. The hemorrhoids       were small.      The exam was otherwise without abnormality. Impression:               - One 10 mm polyp at the hepatic flexure, removed                            with a hot snare. Resected and retrieved. Clips                            were placed.                           - Tattoos were seen in the distal transverse colon.                             There was no evidence of residual polyp tissue.                           - Mild diverticulosis in the ascending colon and in                            the cecum.                           - Internal hemorrhoids.                           - The examination was otherwise normal. Moderate Sedation:      N/A Recommendation:           - Patient has a contact number available for                            emergencies. The signs and symptoms of potential                            delayed complications were discussed with the                            patient. Return to normal activities tomorrow.                            Written discharge instructions were provided to the                            patient.                           - Resume previous diet.                           -  Continue present medications.                           - Await pathology results.                           - No recommendation at this time regarding repeat                            colonoscopy due to age. Procedure Code(s):        --- Professional ---                           [phone], Colonoscopy, flexible; with removal of                            tumor(s), polyp(s), or other lesion(s) by snare                            technique Diagnosis Code(s):        --- Professional ---                           D12.3, Benign neoplasm of transverse colon (hepatic                            flexure or splenic flexure)                           K64.8, Other hemorrhoids                           R19.5, Other fecal abnormalities                           K57.30, Diverticulosis of large intestine without                            perforation or abscess without bleeding CPT copyright 2017 American Medical Association. All rights reserved. The codes documented in this report are preliminary and upon coder review may  be revised to meet current compliance requirements. Jerene Bears, MD 11/02/2017  12:57:27 PM This report has been signed electronically. Number of Addenda: 0

## 2017-11-02 NOTE — Anesthesia Postprocedure Evaluation (Signed)
Anesthesia Post Note  Patient: Kevin Watkins  Procedure(s) Performed: ESOPHAGOGASTRODUODENOSCOPY (EGD) (N/A ) COLONOSCOPY WITH PROPOFOL (N/A )     Patient location during evaluation: Endoscopy Anesthesia Type: MAC Level of consciousness: awake and alert Pain management: pain level controlled Vital Signs Assessment: post-procedure vital signs reviewed and stable Respiratory status: spontaneous breathing, nonlabored ventilation, respiratory function stable and patient connected to nasal cannula oxygen Cardiovascular status: blood pressure returned to baseline and stable Postop Assessment: no apparent nausea or vomiting Anesthetic complications: no    Last Vitals:  Vitals:   11/02/17 1300 11/02/17 1310  BP: (!) 104/56 (!) 98/45  Pulse: (!) 108 (!) 112  Resp: (!) 24 (!) 24  Temp:    SpO2: 94% 94%    Last Pain:  Vitals:   11/02/17 1310  TempSrc:   PainSc: 0-No pain                 Barnell Shieh DANIEL

## 2017-11-02 NOTE — Progress Notes (Signed)
Physical Therapy Treatment Patient Details Name: Kevin Watkins MRN: [id number] DOB: 19-Mar-1941 Today's Date: 11/02/2017    History of Present Illness Pt. is a 77 y.o. M with significant PMH of hypertension, hyperlipidemia, COPD, GERD, CAD, stent placement, CHF, alcohol abuse, tobacco abuse, PVD, iron deficiency anemia, atrial fibrillation on Eliquis, CKD-3, who presents with shortness of breath. Chest x-ray showing interstitial lung disease with bilateral pleural effusion.     PT Comments    Patient is progressing very well towards their physical therapy goals. Increase in ambulation distance to 220 feet this session with Rollator and supervision. Improved gait speed using Rollator. Rest of session focused on functional strength training with sit to stands from lowered bed height to increase leg strength and power.  Overall, patient seems much more energetic and has improved endurance today following transfusion yesterday. Will continue to follow to progress activity tolerance, reminders for safety awareness with transition home, and functional strengthening.     Follow Up Recommendations  Home health PT;Supervision for mobility/OOB     Equipment Recommendations  None recommended by PT    Recommendations for Other Services       Precautions / Restrictions Precautions Precautions: Fall Restrictions Weight Bearing Restrictions: No    Mobility  Bed Mobility               General bed mobility comments: Sitting EOB  Transfers Overall transfer level: Needs assistance Equipment used: 4-wheeled walker Transfers: Sit to/from Stand Sit to Stand: Supervision         General transfer comment: Supervision for safety due to decreased safety awareness. Occasionally forgets to lock brakes prior to transferring and needs verbal reminders  Ambulation/Gait Ambulation/Gait assistance: Supervision Ambulation Distance (Feet): 220 Feet Assistive device: 4-wheeled walker Gait  Pattern/deviations: Step-through pattern;Decreased dorsiflexion - right;Decreased dorsiflexion - left     General Gait Details: Patient with increased gait speed using Rollator. Required one seated rest break due to dizziness; resolved after ~20 seconds. Unable to take BP due to being in the hallway with the patient. DOE 3/4 upon return to the room.   Stairs             Wheelchair Mobility    Modified Rankin (Stroke Patients Only)       Balance Overall balance assessment: Mild deficits observed, not formally tested                                          Cognition Arousal/Alertness: Awake/alert Behavior During Therapy: WFL for tasks assessed/performed Overall Cognitive Status: Within Functional Limits for tasks assessed                                 General Comments: HOH      Exercises Other Exercises Other Exercises: 5 sit to stands from lowered bed height with unilateral hand support    General Comments        Pertinent Vitals/Pain Pain Assessment: No/denies pain    Home Living                      Prior Function            PT Goals (current goals can now be found in the care plan section) Acute Rehab PT Goals Patient Stated Goal: Be able to go eat breakfast with  friends PT Goal Formulation: With patient Time For Goal Achievement: 11/13/17 Potential to Achieve Goals: Good    Frequency    Min 3X/week      PT Plan Current plan remains appropriate    Co-evaluation              AM-PAC PT "6 Clicks" Daily Activity  Outcome Measure  Difficulty turning over in bed (including adjusting bedclothes, sheets and blankets)?: None Difficulty moving from lying on back to sitting on the side of the bed? : A Little Difficulty sitting down on and standing up from a chair with arms (e.g., wheelchair, bedside commode, etc,.)?: A Little Help needed moving to and from a bed to chair (including a wheelchair)?: A  Little Help needed walking in hospital room?: A Little Help needed climbing 3-5 steps with a railing? : A Lot 6 Click Score: 18    End of Session Equipment Utilized During Treatment: Gait belt;Oxygen Activity Tolerance: Patient tolerated treatment well Patient left: with call bell/phone within reach;with family/visitor present;Other (comment)(seated EOB) Nurse Communication: Mobility status PT Visit Diagnosis: Unsteadiness on feet (R26.81);Difficulty in walking, not elsewhere classified (R26.2)     Time: 4166-0630 PT Time Calculation (min) (ACUTE ONLY): 18 min  Charges:  $Therapeutic Activity: 8-22 mins                    G Codes:       Ellamae Sia, PT, DPT Acute Rehabilitation Services  Pager: [phone]    Willy Eddy 11/02/2017, 2:57 PM

## 2017-11-02 NOTE — Progress Notes (Signed)
PROGRESS NOTE    Kevin Watkins  DGL:[id number] DOB: 05/03/1941 DOA: 10/28/2017 PCP: Ronita Hipps, MD     Brief Narrative:  Kevin Watkins is a 77 y.o. male with medical history significant of hypertension, hyperlipidemia, COPD, GERD, CAD, stent placement, dCHF, alcohol abuse, tobacco abuse, PVD, iron deficiency anemia, atrial fibrillation on Eliquis, CKD-3, who presents with shortness of breath. Patient states that he has been having shortness of breath in the past 2 months, which has been progressively getting worse. He does not have chest pain, fever or chills. Patient states that he has mild cough, 2 or 3 days each day, but most of time he coughs up dark colored material, which he thinks blood. Patient has mild leg edema. Patient does not have rectal bleeding, hematochezia, hematemesis, hematuria. He states that he stopped smoking currently he is taking Chantix. In the ED, he was found to have worsening anemia, Hgb 7.9. CXR revealed interstitial lung disease with bilateral pleural effusion and patient was admitted for further work up and evaluation. He was diuresed with IV lasix for CHF exacerbation and GI consulted for blood loss anemia.   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 Patient's weight has been stable per patient, but he has bilateral leg edema, positive JVD and elevated BNP 427, consistent with CHF exacerbation -Uses 2L [place] O2 at baseline  Acute on chronic diastolic CHF -Echo with EF 60% -Lasix 40 mg PO  -Daily weight, strict I/Os  -Continue metoprolol -Cardiology following   Permanent atrial fibrillation -CHA2DS2-VASc Score is 5 -Continue metoprolol, cardizem dose increased today for improved rate control  -Resume eliquis  -Cardiology following   Iron deficiency anemia with blood loss anemia  -Hgb 11.5 in 12/2016 --> 8.5 10/25/2017 --> 7.9 on admission. Patient denies any hematochezia, melenotic stool  -Patient has been following with Dr. Nehemiah Settle in [facility].  His last colonoscopy was January 08, 2014. At that time, patient reports that polyps were removed and he was recommended for repeat colonoscopy in 3 years.  After discussion with his GI physician, they discussed that patient is a very poor candidate for further colonoscopy due to his medical comorbidities and repeat colonoscopy was deferred at that time. -1u pRBC given 4/24 -Underwent EGD and colonoscopy 4/25.  No source for heme positive stools/blood loss.  Recommend oral iron replacement, monitor hemoglobin closely.  Video capsule endoscopy can be considered in the future in the event of recurrent anemia heme positive stools. -Resume eliquis   Delirium -Had episode of agitation and confusion overnight 4/21. He received benadryl 50mg  for sleep (which he takes at home as well), went to sleep, then woke up in the middle of the night very confused and agitated.  -Hold off on additional doses  of benadryl going forward  -Resolved and back to baseline   Hx of CAD-RCA BMS 2001, low risk Myoview 2011 -Continue lipitor and metoprolol -Resume aspirin   Tobacco abuse and alcohol abuse -He stopped smoking currently taking Chantix  HLD -Continue lipitor, lovaza   HTN -Continue  metoprolol  CKD stage III -Baseline creatinine 1.5-1.7 -Remains stable    DVT prophylaxis: SCD  Code Status: Full Family Communication: Wife at bedside Disposition Plan: S/p endoscopy. Monitor overnight, discharge home 4/26 if stable    Consultants:   GI   Cardiology   Procedures:   None   Antimicrobials:  Anti-infectives (From admission, onward)   None       Subjective: No new complaints.  Breathing stable.  Denies any chest pain or other issues.  Wants to go home  Objective: Vitals:   11/02/17 1412 11/02/17 1608 11/02/17 1700 11/02/17 1705  BP: 116/64 100/63 (!) 80/52 (!) 97/40  Pulse:  (!) 50 96 (!) 101  Resp: 18 18 (!) 22 (!) 23  Temp: 98.1 F (36.7 C) 98.2 F (36.8 C)    TempSrc: Oral Oral    SpO2: 92% 90% (!) 89% 99%  Weight:      Height:        Intake/Output Summary (Last 24 hours) at 11/02/2017 1758 Last data filed at 11/02/2017 1252 Gross per 24 hour  Intake 200 ml  Output -  Net 200 ml   Filed Weights   10/31/17 0417 11/01/17 0456 11/02/17 0204  Weight: 87.9 kg (193 lb 11.2 oz) 87.6 kg (193 lb 3.2 oz) 87.6 kg (193 lb 3.2 oz)   Examination: General exam: Appears calm and comfortable  Respiratory system: Clear to auscultation. Respiratory effort normal. Cardiovascular system: S1 & S2 heard, irregular rhythm, rate 80 to 90s. No JVD, murmurs, rubs, gallops or clicks.  Trace pedal edema. Gastrointestinal system: Abdomen is nondistended, soft and nontender. No organomegaly or masses felt. Normal bowel sounds heard. Central nervous system: Alert and oriented. No focal neurological deficits. Extremities: Symmetric 5 x 5 power. Skin: No rashes, lesions or ulcers Psychiatry: Judgement and insight appear normal. Mood & affect appropriate.     Data Reviewed: I have personally reviewed following labs and imaging studies  CBC: Recent Labs  Lab 10/29/17 1227 10/30/17 0346 10/31/17 0438 11/01/17 0240 11/02/17 0245  WBC 7.0 10.9* 13.6* 7.5 7.2    HGB 7.8* 7.7* 7.9* 7.4* 8.4*  HCT 24.7* 23.7* 25.3* 23.2* 26.1*  MCV 87.0 86.8 89.7 87.9 87.0  PLT 170 184 197 173 892   Basic Metabolic Panel: Recent Labs  Lab 10/29/17 0316 10/30/17 0346 10/31/17 0438 11/01/17 0240 11/02/17 0245  NA 137 134* 134* 136 135  K 4.0 4.2 4.3 4.1 3.5  CL 102 102 100* 99* 100*  CO2 24 24 27 28 25   GLUCOSE 134* 146* 119* 104* 112*  BUN 24* 31* 34* 26* 22*  CREATININE 1.53* 1.48* 1.52* 1.42* 1.41*  CALCIUM 9.0 8.8* 8.8* 8.8* 8.4*   GFR: Estimated Creatinine Clearance: 45.6 mL/min (A) (by C-G formula based on SCr of 1.41 mg/dL (H)). Liver Function Tests: No results for input(s): AST, ALT, ALKPHOS, BILITOT, PROT, ALBUMIN in the last 168 hours. No results for input(s): LIPASE, AMYLASE in the last 168 hours. No results for input(s): AMMONIA in the last 168 hours. Coagulation Profile: Recent Labs  Lab 10/28/17 2331  INR 1.58   Cardiac Enzymes: No results for input(s): CKTOTAL, CKMB, CKMBINDEX, TROPONINI in the last 168 hours. BNP (last 3 results)  No results for input(s): PROBNP in the last 8760 hours. HbA1C: No results for input(s): HGBA1C in the last 72 hours. CBG: No results for input(s): GLUCAP in the last 168 hours. Lipid Profile: No results for input(s): CHOL, HDL, LDLCALC, TRIG, CHOLHDL, LDLDIRECT in the last 72 hours. Thyroid Function Tests: No results for input(s): TSH, T4TOTAL, FREET4, T3FREE, THYROIDAB in the last 72 hours. Anemia Panel: No results for input(s): VITAMINB12, FOLATE, FERRITIN, TIBC, IRON, RETICCTPCT in the last 72 hours. Sepsis Labs: No results for input(s): PROCALCITON, LATICACIDVEN in the last 168 hours.  No results found for this or any previous visit (from the past 240 hour(s)).     Radiology Studies: No results found.    Scheduled Meds: . acidophilus  1 capsule Oral Daily  . atorvastatin  40 mg Oral q1800  . B-complex with vitamin C  1 tablet Oral Daily  . diltiazem  30 mg Oral Q8H  . ferrous sulfate   325 mg Oral BID WC  . folic acid  1 mg Oral Daily  . furosemide  40 mg Oral Daily  . ipratropium-albuterol  3 mL Nebulization TID  . mouth rinse  15 mL Mouth Rinse BID  . metoprolol tartrate  25 mg Oral BID  . multivitamin with minerals  1 tablet Oral Daily  . omega-3 acid ethyl esters  1 g Oral Daily  . pantoprazole  40 mg Oral Q0600  . psyllium  1 packet Oral Daily  . sodium chloride flush  3 mL Intravenous Q12H  . thiamine  100 mg Oral Daily  . varenicline  1 mg Oral BID  . vitamin E  400 Units Oral Daily   Continuous Infusions: . sodium chloride    . sodium chloride       LOS: 4 days    Time spent: 25 minutes   Dessa Phi, DO [facility] [email or web address] Password [facility] 11/02/2017, 5:58 PM

## 2017-11-02 NOTE — Transfer of Care (Signed)
Immediate Anesthesia Transfer of Care Note  Patient: Kevin Watkins  Procedure(s) Performed: ESOPHAGOGASTRODUODENOSCOPY (EGD) (N/A ) COLONOSCOPY WITH PROPOFOL (N/A )  Patient Location: PACU and Endoscopy Unit  Anesthesia Type:MAC  Level of Consciousness: awake and alert   Airway & Oxygen Therapy: Patient Spontanous Breathing and Patient connected to nasal cannula oxygen  Post-op Assessment: Report given to RN and Post -op Vital signs reviewed and stable  Post vital signs: Reviewed and stable  Last Vitals:  Vitals Value Taken Time  BP 110/51 11/02/2017 12:50 PM  Temp 36.9 C 11/02/2017 12:50 PM  Pulse 111 11/02/2017 12:52 PM  Resp 29 11/02/2017 12:52 PM  SpO2 89 % 11/02/2017 12:52 PM  Vitals shown include unvalidated device data.  Last Pain:  Vitals:   11/02/17 1250  TempSrc: Oral  PainSc: 0-No pain         Complications: No apparent anesthesia complications

## 2017-11-02 NOTE — Progress Notes (Addendum)
Progress Note  Patient Name: Kevin Watkins Date of Encounter: 11/02/2017  Primary Cardiologist: Quay Burow, MD   Subjective   Back from endo and colonoscopy no chest pain or SOB  Inpatient Medications    Scheduled Meds: . acidophilus  1 capsule Oral Daily  . atorvastatin  40 mg Oral q1800  . B-complex with vitamin C  1 tablet Oral Daily  . diltiazem  30 mg Oral Q12H  . ferrous sulfate  325 mg Oral BID WC  . folic acid  1 mg Oral Daily  . furosemide  40 mg Oral Daily  . ipratropium-albuterol  3 mL Nebulization TID  . mouth rinse  15 mL Mouth Rinse BID  . metoprolol tartrate  25 mg Oral BID  . multivitamin with minerals  1 tablet Oral Daily  . omega-3 acid ethyl esters  1 g Oral Daily  . pantoprazole  40 mg Oral Q0600  . psyllium  1 packet Oral Daily  . sodium chloride flush  3 mL Intravenous Q12H  . thiamine  100 mg Oral Daily  . varenicline  1 mg Oral BID  . vitamin E  400 Units Oral Daily   Continuous Infusions: . sodium chloride    . sodium chloride     PRN Meds: sodium chloride, acetaminophen, albuterol, guaiFENesin, ondansetron (ZOFRAN) IV, polyethylene glycol, sodium chloride flush   Vital Signs    Vitals:   11/02/17 1250 11/02/17 1300 11/02/17 1310 11/02/17 1412  BP: (!) 110/51 (!) 104/56 (!) 98/45 116/64  Pulse: (!) 104 (!) 108 (!) 112   Resp: (!) 29 (!) 24 (!) 24 18  Temp: 98.5 F (36.9 C)   98.1 F (36.7 C)  TempSrc: Oral   Oral  SpO2: 91% 94% 94% 92%  Weight:      Height:        Intake/Output Summary (Last 24 hours) at 11/02/2017 1445 Last data filed at 11/02/2017 1252 Gross per 24 hour  Intake 200 ml  Output -  Net 200 ml   Filed Weights   10/31/17 0417 11/01/17 0456 11/02/17 0204  Weight: 193 lb 11.2 oz (87.9 kg) 193 lb 3.2 oz (87.6 kg) 193 lb 3.2 oz (87.6 kg)    Telemetry    A fib  - Personally Reviewed  ECG    No new - Personally Reviewed  Physical Exam   GEN: No acute distress.   Neck: No JVD Cardiac: irreg irreg, no  murmurs, rubs, or gallops.  Respiratory: breath sounds to auscultation bilaterally. Diminished in bases GI: Soft, nontender, non-distended  MS: No edema; No deformity. Neuro:  Nonfocal  Psych: Normal affect   Labs    Chemistry Recent Labs  Lab 10/31/17 0438 11/01/17 0240 11/02/17 0245  NA 134* 136 135  K 4.3 4.1 3.5  CL 100* 99* 100*  CO2 27 28 25   GLUCOSE 119* 104* 112*  BUN 34* 26* 22*  CREATININE 1.52* 1.42* 1.41*  CALCIUM 8.8* 8.8* 8.4*  GFRNONAA 42* 46* 46*  GFRAA 49* 53* 54*  ANIONGAP 7 9 10      Hematology Recent Labs  Lab 10/31/17 0438 11/01/17 0240 11/02/17 0245  WBC 13.6* 7.5 7.2  RBC 2.82* 2.64* 3.00*  HGB 7.9* 7.4* 8.4*  HCT 25.3* 23.2* 26.1*  MCV 89.7 87.9 87.0  MCH 28.0 28.0 28.0  MCHC 31.2 31.9 32.2  RDW 19.6* 19.8* 18.5*  PLT 197 173 158    Cardiac EnzymesNo results for input(s): TROPONINI in the last 168 hours.  Recent  Labs  Lab 10/28/17 1924  TROPIPOC 0.01     BNP Recent Labs  Lab 10/28/17 2230  BNP 427.9*     DDimer No results for input(s): DDIMER in the last 168 hours.   Radiology    No results found.  Cardiac Studies   10/29/17 ECHO Study Conclusions  - Left ventricle: The cavity size was normal. Wall thickness was increased increased in a pattern of mild to moderate LVH. Systolic function was normal. The estimated ejection fraction was in the range of 60% to 65%. Wall motion was normal; there were no regional wall motion abnormalities. The study is not technically sufficient to allow evaluation of LV diastolic function. - Aortic valve: Mildly calcified annulus. Trileaflet; normal thickness leaflets. - Mitral valve: Mildly to moderately calcified annulus. Normal thickness leaflets . - Left atrium: The atrium was mildly to moderately dilated.    Patient Profile     77 y.o. male with a hx of CAD s/p PCI with BMS to RCA 06/2000, negative myoview 2001, s/p stent to right subclavian (2001) and  innominate (2003), stent to right renal artery, persistent atrial fibrillation on eliquis and rate controlled with lopressor, HTN, HLD, CKD stage III, PVD, former smoker, COPD on home oxygen, and alcohol dependence now seen for CHF exacerbation and a fib.  Also anemic at hgb 7.9.  Eliquis held.    Assessment & Plan    Acute on chronic diastolic HF with neg 086 since admit.  Wt down to 193 from 197 -- on lasix 40 mg po daily.  This was home dose and he has order to take extra for wt gain.    Cr 1.41  pt feels better.    Atrial fib -persistent since 2017 at least, so will now call permanent and has been in a fib since November --was rapid on admit  --has been on Eliquis and would like to resume once GI bleed is resolved. -HR is mostly 95-105 occ to 120 though at night HR to 55 to 65. Now on dilt 30 mg BID and lopressor 25 mg BID  ? Discharge today Dr. Claiborne Billings to address.    CAD with hx of stents. No chest pain.   Acute on chronic anemia Hgb 8.4 today with polyps removed today.  Acute GI bleed.    For questions or updates, please contact [place] Please consult [email or web address] for contact info under Cardiology/STEMI.      Signed, Cecilie Kicks, NP  11/02/2017, 2:45 PM     Patient seen and examined. Agree with assessment and plan.  Underwent colonoscopy and polypectomy today.  Mild diverticulosis in the ascending colon and cecum and there is evidence for internal hemorrhoids.  AF rate continues to be somewhat elevated.  He received 1 unit of packed red blood cells yesterday.  Hemoglobin 8.4 today increased from 1.4 yesterday.  With ventricular rate still in the 100-1 05 range, will increase Cardizem to 30 mg every 8 hours.  Recommend  keep hospital tonight  with probable discharge tomorrow morning.   Troy Sine, MD, [facility] 11/02/2017 4:47 PM

## 2017-11-02 NOTE — Progress Notes (Signed)
Endoscopy and colonoscopy performed, see procedure reports for details No source for recent heme positive stool/blood loss Would recommend oral iron replacement and monitoring hemoglobin closely going forward. Video capsule endoscopy can be considered in the future in the event of recurrent anemia with heme positive stools. GI signing off, call with questions

## 2017-11-03 ENCOUNTER — Encounter (HOSPITAL_COMMUNITY): Payer: Self-pay | Admitting: Internal Medicine

## 2017-11-03 ENCOUNTER — Encounter: Payer: Self-pay | Admitting: Internal Medicine

## 2017-11-03 LAB — BASIC METABOLIC PANEL
Anion gap: 9 (ref 5–15)
BUN: 21 mg/dL — AB (ref 6–20)
CO2: 24 mmol/L (ref 22–32)
CREATININE: 1.42 mg/dL — AB (ref 0.61–1.24)
Calcium: 8.5 mg/dL — ABNORMAL LOW (ref 8.9–10.3)
Chloride: 101 mmol/L (ref 101–111)
GFR calc Af Amer: 53 mL/min — ABNORMAL LOW (ref >60)
GFR calc non Af Amer: 46 mL/min — ABNORMAL LOW (ref >60)
GLUCOSE: 124 mg/dL — AB (ref 65–99)
Potassium: 4.1 mmol/L (ref 3.5–5.1)
SODIUM: 134 mmol/L — AB (ref 135–145)

## 2017-11-03 LAB — CBC
HCT: 26.8 % — ABNORMAL LOW (ref 39.0–52.0)
HEMOGLOBIN: 8.4 g/dL — AB (ref 13.0–17.0)
MCH: 27.8 pg (ref 26.0–34.0)
MCHC: 31.3 g/dL (ref 30.0–36.0)
MCV: 88.7 fL (ref 78.0–100.0)
Platelets: 171 10*3/uL (ref 150–400)
RBC: 3.02 MIL/uL — ABNORMAL LOW (ref 4.22–5.81)
RDW: 19.2 % — ABNORMAL HIGH (ref 11.5–15.5)
WBC: 7.8 10*3/uL (ref 4.0–10.5)

## 2017-11-03 MED ORDER — METOPROLOL TARTRATE 25 MG PO TABS: 25.0000 mg | ORAL_TABLET | Freq: Two times a day (BID) | ORAL | 0 refills | Status: DC

## 2017-11-03 MED ORDER — DILTIAZEM HCL 30 MG PO TABS: 30.0000 mg | ORAL_TABLET | Freq: Three times a day (TID) | ORAL | 0 refills | Status: DC

## 2017-11-03 MED ORDER — FUROSEMIDE 40 MG PO TABS: 40.0000 mg | ORAL_TABLET | Freq: Every day | ORAL | 0 refills | Status: AC

## 2017-11-03 NOTE — Progress Notes (Signed)
IV and telemetry discontinued at this time. CCMD notified. Discharge instructions reviewed with patient and family at this time. Pt verbalized understanding.

## 2017-11-03 NOTE — Discharge Summary (Signed)
Physician Discharge Summary  Kevin Watkins WUX:[id number] DOB: 03/14/1941 DOA: 10/28/2017  PCP: Kevin Hipps, MD  Admit date: 10/28/2017 Discharge date: 11/03/2017  Admitted From: Home Disposition:  Home  Recommendations for Outpatient Follow-up:  1. Follow up with PCP in 1 week 2. Follow up with Kevin Watkins, GI in [place], as needed if Hgb drops or GI bleeding recurs. Video capsule endoscopy can be considered in the future in the event of recurrent anemia with heme positive stools. 3. Follow up with Kevin Watkins, Cardiology, in 2 weeks  4. Please obtain CBC in 1 week to repeat Hgb level  5. Please follow up on the following pending results: pathology result from colonoscopy biopsy of polyp   Home Health: PT OT RN  Equipment/Devices: None, home O2 at baseline   Discharge Condition: Stable CODE STATUS: Full  Diet recommendation: Heart healthy   Brief/Interim Summary: Kevin Watkins a 77 y.o.malewith medical history significant ofhypertension, hyperlipidemia, COPD, GERD, CAD, stent placement, dCHF, alcohol abuse, tobacco abuse, PVD, iron deficiency anemia, atrial fibrillation on Eliquis, CKD-3, who presents with shortness of breath. Patient states that he has been having shortness of breath in the past 2 months, which has been progressively getting worse. He does not have chest pain, fever or chills. Patient states that he has mild cough, 2 or 3 days each day, but most of time he coughs up dark colored material, which he thinks blood. Patient has mild leg edema. Patient does not have rectal bleeding, hematochezia, hematemesis, hematuria. He states that he stopped smoking currently he is taking Chantix. In the ED, he was Watkins to have worsening anemia, Hgb 7.9. CXR revealed interstitial lung disease with bilateral pleural effusion and patient was admitted for further work up and evaluation. He was diuresed with IV lasix for CHF exacerbation and GI consulted for blood loss anemia.   Cardiology was  consulted as well during hospitalization for management of atrial fibrillation and CHF.  He was given 1 unit of packed red blood cells with improvement in his hemodynamic status.  He underwent EGD and colonoscopy on 4/25 without source of bleeding Watkins.  GI was recommended that patient follow-up with his GI physician and can consider video capsule endoscopy in the future in the event of recurrent anemia with heme positive stools.  His medications were titrated for improved heart rate and blood pressure control.  On day of discharge, he was feeling well, anxious to go home.  He had no episodes of chest pain or shortness of breath.  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 Patient's weight has been stable per patient, but he has bilateral leg edema, positive JVD and elevated BNP 427, consistent with CHF exacerbation -Uses 2L [place] O2 at baseline  Acute on chronic  diastolic CHF -Echo with EF 60% -Lasix 40 mg PO  -Daily weight, strict I/Os  -Continue metoprolol -Cardiology following   Permanent atrial fibrillation -CHA2DS2-VASc Scoreis 5 -Continue metoprolol, cardizem  -Resume eliquis  -Cardiology following   Iron deficiency anemia with blood loss anemia  -Hgb  11.5 in 12/2016 --> 8.5 10/25/2017 --> 7.9 on admission. Patient denies any hematochezia, melenotic stool  -Patient has been following with Dr. Nehemiah Watkins in [place] [facility].  His last colonoscopy was January 08, 2014. At that time, patient reports that polyps were removed and he was recommended for repeat colonoscopy in 3 years.  After discussion with his GI physician, they discussed that patient is a very poor candidate for further colonoscopy due to his medical comorbidities and repeat colonoscopy was deferred at that time. -1u pRBC given 4/24 -Underwent EGD and colonoscopy 4/25.  No source for heme positive stools/blood loss.  Recommend oral iron replacement, monitor hemoglobin closely.  Video capsule endoscopy can be considered in the future in the event of recurrent anemia heme positive stools. -Resume eliquis   Delirium -Had episode of agitation and confusion overnight 4/21. He received benadryl 50mg  for sleep (which he takes at home as well), went to sleep, then woke up in the middle of the night very confused and agitated.  -Hold off on additional doses of benadryl going forward  -Resolved and back to baseline   Hx ofCAD-RCA BMS 2001, low risk Myoview 2011 -Continue lipitor and metoprolol -Resume aspirin   Tobacco abuse and alcohol abuse -He stopped smoking currently taking Chantix  HLD -Continue lipitor, lovaza   HTN -Continue metoprolol  CKD stage III -Baseline creatinine 1.5-1.7 -Remains stable      Discharge Instructions  Discharge Instructions    (HEART FAILURE PATIENTS) Call MD:  Anytime you have any of the following symptoms: 1) 3 pound weight gain in 24 hours or 5 pounds in 1 week 2) shortness of breath, with or without a dry hacking cough 3) swelling in the hands, feet or stomach 4) if you have to sleep on extra pillows at night in order to breathe.   Complete by:  As directed    AMB Referral to [facility]   Complete by:  As directed    Please assign  to [facility] for COPD. Initial referral received from [facility]. Written consent obtained. Currently at [facility]. PCP office [facility]) listed as doing toc. Please call with questions. Marthenia Rolling, [place], [facility] Liaison-[phone]   Reason for consult:  Please assign to [facility] RNCM   Diagnoses of:   COPD/ Pneumonia Heart Failure     Expected date of contact:  1-3 days (reserved for hospital discharges)   Call MD for:   Complete by:  As directed    Dark or bloody stool   Call MD for:  difficulty breathing, headache or visual disturbances   Complete by:  As directed    Call MD for:  extreme fatigue   Complete by:  As directed    Call MD for:  hives   Complete by:  As directed    Call MD for:  persistant dizziness or light-headedness   Complete by:  As directed    Call MD for:  persistant nausea and vomiting   Complete by:  As directed    Call MD for:  severe uncontrolled pain   Complete by:  As directed    Call MD for:  temperature >100.4   Complete  by:  As directed    Diet - low sodium heart healthy   Complete by:  As directed    Discharge instructions   Complete by:  As directed    You were cared for by a hospitalist during your hospital stay. If you have any questions about your discharge medications or the care you received while you were in the hospital after you are discharged, you can call the unit and asked to speak with the hospitalist on call if the hospitalist that took care of you is not available. Once you are discharged, your primary care physician will handle any further medical issues. Please note that NO REFILLS for any discharge medications will be authorized once you are discharged, as it is imperative that you return to your primary care physician (or establish a relationship with a primary care physician if you do not have one) for your aftercare needs so that they can reassess your need for medications and monitor your lab  values.   Increase activity slowly   Complete by:  As directed      Allergies as of 11/03/2017   No Known Allergies     Medication List    TAKE these medications   apixaban 5 MG Tabs tablet Commonly known as:  ELIQUIS Take 1 tablet (5 mg total) by mouth 2 (two) times daily.   aspirin EC 81 MG tablet Take 81 mg by mouth daily.   atorvastatin 40 MG tablet Commonly known as:  LIPITOR Take 40 mg by mouth daily at 6 PM.   BENEFIBER PO Take 20 mLs by mouth daily. Mix 4 teaspoon (20 ml) in water and drink daily   diltiazem 30 MG tablet Commonly known as:  CARDIZEM Take 1 tablet (30 mg total) by mouth every 8 (eight) hours.   diphenhydrAMINE 25 MG tablet Commonly known as:  BENADRYL Take 25-50 mg by mouth See admin instructions. Take two capsules (50 mg) by mouth daily at bedtime, may also take one or two capsule (25-50 mg) during the night as needed for sleep   ferrous sulfate 325 (65 FE) MG tablet Take 1 tablet (325 mg total) by mouth 2 (two) times daily with a meal.   Fish Oil 1200 MG Caps Take 1,200 mg by mouth 2 (two) times daily.   folic acid 272 MCG tablet Commonly known as:  FOLVITE Take 800 mcg by mouth daily.   furosemide 40 MG tablet Commonly known as:  LASIX Take 1 tablet (40 mg total) by mouth daily. Start taking on:  11/04/2017 What changed:    how much to take  how to take this  when to take this  additional instructions   Garlic 5366 MG Caps Take 1,000 mg by mouth daily.   guaifenesin 100 MG/5ML syrup Commonly known as:  ROBITUSSIN Take by mouth every 4 (four) hours as needed for cough.   guaiFENesin-dextromethorphan 100-10 MG/5ML syrup Commonly known as:  ROBITUSSIN DM Take 5 mLs by mouth every 4 (four) hours as needed for cough.   ipratropium-albuterol 0.5-2.5 (3) MG/3ML Soln Commonly known as:  DUONEB Take 3 mLs by nebulization 4 (four) times daily.   metoprolol tartrate 25 MG tablet Commonly known as:  LOPRESSOR Take 1 tablet (25 mg  total) by mouth 2 (two) times daily. What changed:  how much to take   [place] 2 drops under the tongue 2 (two) times daily. CBD tincture/hemp flower extract   OXYGEN Inhale 2 L into the lungs  continuous.   polyethylene glycol packet Commonly known as:  MIRALAX / GLYCOLAX Take by mouth See admin instructions. Mix 3 teaspoonsful (15 mls) in water and drink every evening   potassium chloride SA 20 MEQ tablet Commonly known as:  K-DUR,KLOR-CON Take 1 tablet (20 mEq total) by mouth daily.   PROAIR HFA 108 (90 Base) MCG/ACT inhaler Generic drug:  albuterol Inhale 2 puffs into the lungs every 6 (six) hours as needed for wheezing or shortness of breath.   PROBIOTIC ACIDOPHILUS PO Take 1 tablet by mouth daily.   ranitidine 75 MG tablet Commonly known as:  ZANTAC Take 75 mg by mouth daily.   thiamine 100 MG tablet Take 1 tablet (100 mg total) by mouth daily.   varenicline 0.5 MG X 11 & 1 MG X 42 tablet Commonly known as:  CHANTIX PAK Take 0.5 mg by mouth See admin instructions. Take one tablet (0.5 mg)  by mouth once daily for 3 days, then increase to one tablet (0.5 mg)  twice daily for 4 days, then increase to  one tablet (1 mg) twice daily for 21 days   VITAMIN B50 COMPLEX PO Take 1 tablet by mouth daily.   vitamin E 400 UNIT capsule Generic drug:  vitamin E Take 400 Units by mouth daily.      Follow-up Information    Lorretta Harp, MD Follow up on 11/21/2017.   Specialties:  Cardiology, Radiology Why:  3:00 PM  Contact information: [address] [place] [address] [phone]        Kevin Hipps, MD. Schedule an appointment as soon as possible for a visit in 1 week(s).   Specialty:  Family Medicine Contact information: [place]        Kevin Settle, MD Follow up.   Specialty:  Gastroenterology Why:  Call with questions or on as needed basis for GI bleeding/anemia   Contact information: [place] [address] [phone]          No Known Allergies  Consultations:  GI  Cardiology    Procedures/Studies: Dg Chest 2 View  Result Date: 10/28/2017 CLINICAL DATA:  Cough and SOB x2 months. Hx of COPD, CAD, CHF, HTN. Smoker. EXAM: CHEST - 2 VIEW COMPARISON:  10/25/2016 FINDINGS: Heart size is accentuated by technique. There is diffuse prominence of interstitial markings with a basilar predominance. Small bilateral pleural effusions are again noted, possibly slightly increased. There is minimal bibasilar atelectasis. IMPRESSION: 1. Chronic interstitial lung disease. 2. Increasing bilateral pleural effusions and minimal bibasilar atelectasis. Electronically Signed   By: Nolon Nations M.D.   On: 10/28/2017 19:42   Dg Chest 2 View  Result Date: 10/25/2017 CLINICAL DATA:  Shortness of breath over the last 2 weeks. Hemoptysis over the last month. EXAM: CHEST - 2 VIEW COMPARISON:  08/01/2017 FINDINGS: Heart size at the upper limits of normal. Chronic aortic atherosclerosis. Chronic interstitial lung disease. Possible superimposed interstitial edema. No focal lesion otherwise. Bilateral effusions with dependent atelectasis. IMPRESSION: Background pattern of chronic interstitial lung disease, possibly with superimposed interstitial edema. Small effusions and dependent atelectasis. Electronically Signed   By: Nelson Chimes M.D.   On: 10/25/2017 16:41    Colonoscopy 4/25 Impression:       - One 10 mm polyp at the hepatic flexure, removed  with a hot snare. Resected and retrieved. Clips                            were placed.                           - Tattoos were seen in the distal transverse colon.                            There was no evidence of residual polyp tissue.                           - Mild diverticulosis in the ascending colon and in                            the cecum.                           -  Internal hemorrhoids.                           - The examination was otherwise normal.    EGD 4/25 Impression:       - Normal esophagus.                           - Normal stomach.                           - Normal examined duodenum.                           - No specimens collected.   Discharge Exam: Vitals:   11/03/17 1445 11/03/17 1450  BP:  100/64  Pulse:    Resp:    Temp: 97.9 F (36.6 C)   SpO2:      General: Pt is alert, awake, not in acute distress Cardiovascular: Irregular rhythm, rate 80-90s, S1/S2 +, no rubs, no gallops, no peripheral edema  Respiratory: CTA bilaterally, no wheezing, no rhonchi, on [place] O2  Abdominal: Soft, NT, ND, bowel sounds + Extremities: no edema, no cyanosis    The results of significant diagnostics from this hospitalization (including imaging, microbiology, ancillary and laboratory) are listed below for reference.     Microbiology: No results Watkins for this or any previous visit (from the past 240 hour(s)).   Labs: BNP (last 3 results) Recent Labs    12/17/16 0634 10/28/17 2230  BNP 187.4* 101.7*   Basic Metabolic Panel: Recent Labs  Lab 10/30/17 0346 10/31/17 0438 11/01/17 0240 11/02/17 0245 11/03/17 0340  NA 134* 134* 136 135 134*  K 4.2 4.3 4.1 3.5 4.1  CL 102 100* 99* 100* 101  CO2 24 27 28 25 24   GLUCOSE 146* 119* 104* 112* 124*  BUN 31* 34* 26* 22* 21*  CREATININE 1.48* 1.52* 1.42* 1.41* 1.42*  CALCIUM 8.8* 8.8* 8.8* 8.4* 8.5*   Liver Function Tests: No results for input(s): AST, ALT, ALKPHOS, BILITOT, PROT, ALBUMIN in the last 168 hours. No results for input(s): LIPASE, AMYLASE in the last 168 hours. No results for input(s): AMMONIA in the last 168 hours. CBC: Recent  Labs  Lab 10/30/17 0346 10/31/17 0438 11/01/17 0240 11/02/17 0245 11/03/17 0340  WBC 10.9* 13.6* 7.5 7.2 7.8  HGB 7.7* 7.9* 7.4* 8.4* 8.4*  HCT 23.7* 25.3* 23.2* 26.1* 26.8*  MCV 86.8 89.7 87.9 87.0 88.7  PLT 184 197 173 158 171    Cardiac Enzymes: No results for input(s): CKTOTAL, CKMB, CKMBINDEX, TROPONINI in the last 168 hours. BNP: Invalid input(s): POCBNP CBG: No results for input(s): GLUCAP in the last 168 hours. D-Dimer No results for input(s): DDIMER in the last 72 hours. Hgb A1c No results for input(s): HGBA1C in the last 72 hours. Lipid Profile No results for input(s): CHOL, HDL, LDLCALC, TRIG, CHOLHDL, LDLDIRECT in the last 72 hours. Thyroid function studies No results for input(s): TSH, T4TOTAL, T3FREE, THYROIDAB in the last 72 hours.  Invalid input(s): FREET3 Anemia work up No results for input(s): VITAMINB12, FOLATE, FERRITIN, TIBC, IRON, RETICCTPCT in the last 72 hours. Urinalysis No results Watkins for: COLORURINE, APPEARANCEUR, LABSPEC, [place], GLUCOSEU, HGBUR, BILIRUBINUR, KETONESUR, PROTEINUR, UROBILINOGEN, NITRITE, LEUKOCYTESUR Sepsis Labs Invalid input(s): PROCALCITONIN,  WBC,  LACTICIDVEN Microbiology No results Watkins for this or any previous visit (from the past 240 hour(s)).   Patient was seen and examined on the day of discharge and was Watkins to be in stable condition. Time coordinating discharge: 40 minutes including assessment and coordination of care, as well as examination of the patient.   SIGNED:  Dessa Phi, DO [facility] Pager [phone]  If 7PM-7AM, please contact night-coverage [email or web address] Password [facility] 11/03/2017, 3:29 PM

## 2017-11-03 NOTE — Care Management Important Message (Signed)
Important Message  Patient Details  Name: Kevin Watkins MRN: [id number] Date of Birth: 06-30-1941   Medicare Important Message Given:  Yes    Delorse Lek 11/03/2017, 12:57 PM

## 2017-11-03 NOTE — Care Management Note (Signed)
Case Management Note Marvetta Gibbons RN, BSN Unit 4E-Case Manager [phone]  Patient Details  Name: Kevin Watkins MRN: [id number] Date of Birth: 08/21/1940  Subjective/Objective:  Pt admitted with acute on chronic resp. Failure, pl. Effusions, CHF, anemia                  Action/Plan: PTA pt lived at home with wife- has home 02 with [facility] base line 2L, [facility] has seen pt and signed up for services. Pt for d/c home today- orders placed for HHRN/PT/OT- spoke with pt and family at bedside- choice offered for [facility] agency per wife  They would like to use [facility] - pt has rollator at home. Referral called to Tillie Rung at [facility]- referral accepted with start of care for 4/29 or sooner if they have an opening. Orders and d/c summary faxed to [facility] at [phone] via epic.   Expected Discharge Date:  11/03/17               Expected Discharge Plan:  [place]  In-House Referral:  NA  Discharge planning Services  CM Consult  Post Acute Care Choice:  Home Health Choice offered to:  Patient, Spouse  DME Arranged:    DME Agency:     HH Arranged:  RN, PT, OT HH Agency:  [facility] [facility]  Status of Service:  Completed, signed off  If discussed at [place] of Stay Meetings, dates discussed:    Discharge Disposition: home/home  health   Additional Comments:  Dawayne Patricia, RN 11/03/2017, 3:47 PM

## 2017-11-03 NOTE — Progress Notes (Addendum)
Progress Note  Patient Name: Kevin Watkins Date of Encounter: 11/03/2017  Primary Cardiologist: Quay Burow, MD   Subjective   No complaints no chest pain and no SOB, he does tell me his BP is better with systolic at 149.    Inpatient Medications    Scheduled Meds: . acidophilus  1 capsule Oral Daily  . apixaban  5 mg Oral BID  . aspirin EC  81 mg Oral Daily  . atorvastatin  40 mg Oral q1800  . B-complex with vitamin C  1 tablet Oral Daily  . diltiazem  30 mg Oral Q8H  . ferrous sulfate  325 mg Oral BID WC  . folic acid  1 mg Oral Daily  . furosemide  40 mg Oral Daily  . ipratropium-albuterol  3 mL Nebulization TID  . mouth rinse  15 mL Mouth Rinse BID  . metoprolol tartrate  25 mg Oral BID  . multivitamin with minerals  1 tablet Oral Daily  . omega-3 acid ethyl esters  1 g Oral Daily  . pantoprazole  40 mg Oral Q0600  . psyllium  1 packet Oral Daily  . sodium chloride flush  3 mL Intravenous Q12H  . thiamine  100 mg Oral Daily  . varenicline  1 mg Oral BID  . vitamin E  400 Units Oral Daily   Continuous Infusions: . sodium chloride    . sodium chloride     PRN Meds: sodium chloride, acetaminophen, albuterol, guaiFENesin, ondansetron (ZOFRAN) IV, polyethylene glycol, sodium chloride flush   Vital Signs    Vitals:   11/03/17 0842 11/03/17 0942 11/03/17 1159 11/03/17 1340  BP:  92/61 (!) 103/55   Pulse:  80 94   Resp:   17   Temp:   98.1 F (36.7 C)   TempSrc:   Oral   SpO2: 93%  95% 97%  Weight:      Height:        Intake/Output Summary (Last 24 hours) at 11/03/2017 1412 Last data filed at 11/03/2017 0904 Gross per 24 hour  Intake 730 ml  Output 350 ml  Net 380 ml   Filed Weights   11/01/17 0456 11/02/17 0204 11/03/17 0432  Weight: 193 lb 3.2 oz (87.6 kg) 193 lb 3.2 oz (87.6 kg) 195 lb 6.4 oz (88.6 kg)    Telemetry    A fib with PVCs but rate improved now 70-90s and at night occ to 59 - Personally Reviewed  ECG    No new - Personally  Reviewed  Physical Exam   GEN: No acute distress.   Neck: No JVD Cardiac: irreg irreg, no murmurs, rubs, or gallops.  Respiratory: Clear to diminished to auscultation bilaterally. No rales or wheezes. GI: Soft, nontender, non-distended  MS: No to tr edema; No deformity. Neuro:  Nonfocal  Psych: Normal affect   Labs    Chemistry Recent Labs  Lab 11/01/17 0240 11/02/17 0245 11/03/17 0340  NA 136 135 134*  K 4.1 3.5 4.1  CL 99* 100* 101  CO2 28 25 24   GLUCOSE 104* 112* 124*  BUN 26* 22* 21*  CREATININE 1.42* 1.41* 1.42*  CALCIUM 8.8* 8.4* 8.5*  GFRNONAA 46* 46* 46*  GFRAA 53* 54* 53*  ANIONGAP 9 10 9      Hematology Recent Labs  Lab 11/01/17 0240 11/02/17 0245 11/03/17 0340  WBC 7.5 7.2 7.8  RBC 2.64* 3.00* 3.02*  HGB 7.4* 8.4* 8.4*  HCT 23.2* 26.1* 26.8*  MCV 87.9 87.0 88.7  MCH 28.0 28.0 27.8  MCHC 31.9 32.2 31.3  RDW 19.8* 18.5* 19.2*  PLT 173 158 171    Cardiac EnzymesNo results for input(s): TROPONINI in the last 168 hours.  Recent Labs  Lab 10/28/17 1924  TROPIPOC 0.01     BNP Recent Labs  Lab 10/28/17 2230  BNP 427.9*     DDimer No results for input(s): DDIMER in the last 168 hours.   Radiology    No results found.  Cardiac Studies   10/29/17 ECHOStudy Conclusions  - Left ventricle: The cavity size was normal. Wall thickness was increased increased in a pattern of mild to moderate LVH. Systolic function was normal. The estimated ejection fraction was in the range of 60% to 65%. Wall motion was normal; there were no regional wall motion abnormalities. The study is not technically sufficient to allow evaluation of LV diastolic function. - Aortic valve: Mildly calcified annulus. Trileaflet; normal thickness leaflets. - Mitral valve: Mildly to moderately calcified annulus. Normal thickness leaflets . - Left atrium: The atrium was mildly to moderately dilated.      Patient Profile     77 y.o. male with a hx of  CAD s/p PCI with BMS to RCA 06/2000, negative myoview 2001, s/p stent to right subclavian (2001) and innominate (2003), stent to right renal artery, persistent atrial fibrillation on eliquis and rate controlled with lopressor, HTN, HLD, CKD stage III, PVD, former smoker, COPD on home oxygen, and alcohol dependence now seen for CHF exacerbation and a fib.  Also anemic at hgb 7.9.  Eliquis held.    Assessment & Plan    Acute on chronic diastolic HF neg 599 since admit wt now down 2 lbs since pk of 197.  Since been on oral diuretic wt drifting up --Cr 1.42 today stable pk high 1.63  --no SOB  Would like to go home.    Atrial fib -permanent  ----was rapid on admit  --has been on Eliquis and would like to resume once GI bleed is resolved. -HR is mostly 95-105 occ to 120 though at night HR to 55 to 65. Now on dilt 30 mg every 8 hours and lopressor 25 mg BID   Acute on chronic anemia with receiving 1 u PRBCs, had EGD and colonoscopy yesterday with polypectomy  hgb today stable at 8.4   CAD with hx of stents no chest pain   Will have him follow up in office in 2 weeks.     For questions or updates, please contact [place] Please consult [email or web address] for contact info under Cardiology/STEMI.      Signed, Cecilie Kicks, NP  11/03/2017, 2:12 PM    Patient seen and examined. Agree with assessment and plan. Feels better; HR better controlled on increased cardizem. Hb stable today; s/p polypectomy OK for dc today from cardiac perspective. F/u with Dr. Gwenlyn Found.    Troy Sine, MD, [facility] 11/03/2017 4:05 PM

## 2017-11-05 DIAGNOSIS — I482 Chronic atrial fibrillation: Secondary | ICD-10-CM | POA: Diagnosis not present

## 2017-11-05 DIAGNOSIS — D631 Anemia in chronic kidney disease: Secondary | ICD-10-CM | POA: Diagnosis not present

## 2017-11-05 DIAGNOSIS — F1020 Alcohol dependence, uncomplicated: Secondary | ICD-10-CM | POA: Diagnosis not present

## 2017-11-05 DIAGNOSIS — Z9981 Dependence on supplemental oxygen: Secondary | ICD-10-CM | POA: Diagnosis not present

## 2017-11-05 DIAGNOSIS — J441 Chronic obstructive pulmonary disease with (acute) exacerbation: Secondary | ICD-10-CM | POA: Diagnosis not present

## 2017-11-05 DIAGNOSIS — I739 Peripheral vascular disease, unspecified: Secondary | ICD-10-CM | POA: Diagnosis not present

## 2017-11-05 DIAGNOSIS — N183 Chronic kidney disease, stage 3 (moderate): Secondary | ICD-10-CM | POA: Diagnosis not present

## 2017-11-05 DIAGNOSIS — I5033 Acute on chronic diastolic (congestive) heart failure: Secondary | ICD-10-CM | POA: Diagnosis not present

## 2017-11-05 DIAGNOSIS — I130 Hypertensive heart and chronic kidney disease with heart failure and stage 1 through stage 4 chronic kidney disease, or unspecified chronic kidney disease: Secondary | ICD-10-CM | POA: Diagnosis not present

## 2017-11-05 DIAGNOSIS — D500 Iron deficiency anemia secondary to blood loss (chronic): Secondary | ICD-10-CM | POA: Diagnosis not present

## 2017-11-05 DIAGNOSIS — F17210 Nicotine dependence, cigarettes, uncomplicated: Secondary | ICD-10-CM | POA: Diagnosis not present

## 2017-11-05 DIAGNOSIS — I2510 Atherosclerotic heart disease of native coronary artery without angina pectoris: Secondary | ICD-10-CM | POA: Diagnosis not present

## 2017-11-05 DIAGNOSIS — J9611 Chronic respiratory failure with hypoxia: Secondary | ICD-10-CM | POA: Diagnosis not present

## 2017-11-05 DIAGNOSIS — Z7901 Long term (current) use of anticoagulants: Secondary | ICD-10-CM | POA: Diagnosis not present

## 2017-11-06 ENCOUNTER — Telehealth: Payer: Self-pay | Admitting: Cardiovascular Disease

## 2017-11-06 MED ORDER — APIXABAN 5 MG PO TABS: 5.0000 mg | ORAL_TABLET | Freq: Two times a day (BID) | ORAL | 3 refills | Status: AC

## 2017-11-06 NOTE — Addendum Note (Signed)
Addended by: Harrington Challenger on: 11/06/2017 01:40 PM   Modules accepted: Orders

## 2017-11-06 NOTE — Telephone Encounter (Signed)
°*  STAT* If patient is at the pharmacy, call can be transferred to refill team.   1. Which medications need to be refilled? (please list name of each medication and dose if known) Eliquis 5mg   2. Which pharmacy/location (including street and city if local pharmacy) is medication to be sent to?[facility]  3. Do they need a 30 day or 90 day supply? 90 day

## 2017-11-08 ENCOUNTER — Other Ambulatory Visit: Payer: Self-pay

## 2017-11-08 NOTE — Patient Outreach (Signed)
Transition of care:  Placed call to patient who was asleep. Spoke with wife [name] who is on the consent form.  Wife states that patient went out to breakfast with some friend today. Reports he is walking well.  Wife reports that patient is taking all medications as prescribed.  Patient has follow up planned with Dr. Helene Kelp tomorrow.  Wife states home health nurse and physical therapist has been out to see patient this week.   PLAN: offered home visit to assess needs and understanding of chronic disease process. No care plan established at this time as I was unable to speak with patient. Home visit planned for May 7th at 59 am.  Confirmed address and provided my contact information.  Tomasa Rand, RN, BSN, CEN [facility] [employer] [phone]

## 2017-11-09 DIAGNOSIS — Z6830 Body mass index (BMI) 30.0-30.9, adult: Secondary | ICD-10-CM | POA: Diagnosis not present

## 2017-11-09 DIAGNOSIS — I509 Heart failure, unspecified: Secondary | ICD-10-CM | POA: Diagnosis not present

## 2017-11-14 ENCOUNTER — Telehealth: Payer: Self-pay

## 2017-11-14 ENCOUNTER — Encounter: Payer: Self-pay | Admitting: Cardiovascular Disease

## 2017-11-14 ENCOUNTER — Other Ambulatory Visit: Payer: Self-pay

## 2017-11-14 NOTE — Telephone Encounter (Signed)
Spoke with Estill Bamberg nurse from [facility] who reports that she is doing a home visit . While there the Pt has reported that he has been feeling dizzy and hypotensive since he was discharged from the hospital on 4/26. He was last seen by his pcp on 5/3. At the visit Pt states he had an dizzy spell while trying to get on the scale. Per Estill Bamberg no medication changes was made.   Orthostatic vitals were taking today by [place]. Sitting BP 72/50 HR 74, standing BP 68/50 HR 99. Nurse was advised to contact EMS but pts verbalized he did not want to go to the ER. Dr. Darrel Hoover) was consulted. Per his recommendation pt was to decrease metoprolol to 12.5 mg BID and keep appointment on 5/14 with Dr. Gwenlyn Found.  [facility] Nurse requested for sooner appointment and advised that was the earliest appointment we had available. Nurse requested to speak with Dr. Gwenlyn Found. Call Routed.

## 2017-11-14 NOTE — Patient Outreach (Signed)
[facility]) Care Management   11/14/2017  Kevin Watkins June 16, 1941 [id number]  Kevin Watkins Kevin Watkins is an 77 y.o. male Arrived for home visit at 10:30 am  Wife Blanch present. Subjective Patient reports he has had low blood pressure since he was discharged home from the hospital.  Reports recent dizzy spells when standing.  Reports that he saw primary MD last week and had low blood pressure.  Patient reports that he has increased swelling of legs today.  Reports nose bleed last night. States that he used afrin on a cotton ball placed in his nose to stop bleeding. Reports this normal amounts of shortness of breath today.  Home health nurse from [facility] seen patient today with concerns about hypotension.  Patient is active with physical therapy.  Patient reports eating pork daily for breakfast.    Objective:  Awake and alert.  Wearing oxygen 24/7 at 2 liters nasal canula.  Ambulating without difficulty. Today's Vitals   11/14/17 1047 11/14/17 1052 11/14/17 1200  BP: (!) 84/56    Pulse: 83    Resp: 20    SpO2: 96%  95%  Weight: 188 lb (85.3 kg)    Height: 1.702 m (5\' 7" )    PainSc:  0-No pain     Review of Systems  Constitutional: Positive for malaise/fatigue.  HENT: Positive for hearing loss.   Eyes: Negative.   Respiratory: Positive for cough, sputum production, shortness of breath and wheezing.        On oxygen at 2 liters nasal canula,  Reports coughing red material. Reports for years.  Reports shortness of breath with ambulation  Cardiovascular: Positive for leg swelling.  Gastrointestinal: Negative.   Genitourinary: Positive for frequency.  Musculoskeletal: Positive for falls and joint pain.  Skin: Negative.   Neurological: Positive for dizziness.  Endo/Heme/Allergies: Bruises/bleeds easily.  Psychiatric/Behavioral: The patient is nervous/anxious and has insomnia.     Physical Exam  Constitutional: He is oriented to person, place, and time. He appears  well-developed and well-nourished.  Cardiovascular: Normal rate and intact distal pulses.  Irregular heart beat.   Respiratory: Effort normal and breath sounds normal.  Lungs clear  GI: Soft. Bowel sounds are normal.  Musculoskeletal: Normal range of motion. He exhibits edema.  2 plus edema. Reports increase in swelling today  Neurological: He is alert and oriented to person, place, and time.  Skin: Skin is warm and dry.  Psychiatric: He has a normal mood and affect. His behavior is normal. Judgment and thought content normal.    Encounter Medications:   Outpatient Encounter Medications as of 11/14/2017  Medication Sig Note  . apixaban (ELIQUIS) 5 MG TABS tablet Take 1 tablet (5 mg total) by mouth 2 (two) times daily.   [name] aspirin EC 81 MG tablet Take 81 mg by mouth daily.   [name] atorvastatin (LIPITOR) 40 MG tablet Take 40 mg by mouth daily at 6 PM.    . B Complex-Biotin-FA (VITAMIN B50 COMPLEX PO) Take 1 tablet by mouth daily.   . diphenhydrAMINE (BENADRYL) 25 MG tablet Take 25-50 mg by mouth See admin instructions. Take two capsules (50 mg) by mouth daily at bedtime, may also take one or two capsule (25-50 mg) during the night as needed for sleep   . ferrous sulfate 325 (65 FE) MG tablet Take 1 tablet (325 mg total) by mouth 2 (two) times daily with a meal.   . folic acid (FOLVITE) 956 MCG tablet Take 800 mcg by mouth daily.   [name]  furosemide (LASIX) 40 MG tablet Take 1 tablet (40 mg total) by mouth daily.   . Garlic 0814 MG CAPS Take 1,000 mg by mouth daily.    [name] guaifenesin (ROBITUSSIN) 100 MG/5ML syrup Take by mouth every 4 (four) hours as needed for cough. 10/28/2017: One "swaller" - unknown quantity  . guaiFENesin-dextromethorphan (ROBITUSSIN DM) 100-10 MG/5ML syrup Take 5 mLs by mouth every 4 (four) hours as needed for cough.   [name] ipratropium-albuterol (DUONEB) 0.5-2.5 (3) MG/3ML SOLN Take 3 mLs by nebulization 4 (four) times daily.    . Lactobacillus (PROBIOTIC ACIDOPHILUS PO) Take 1 tablet  by mouth daily.   . metoprolol tartrate (LOPRESSOR) 25 MG tablet Take 1 tablet (25 mg total) by mouth 2 (two) times daily.   . Omega-3 Fatty Acids (FISH OIL) 1200 MG CAPS Take 1,200 mg by mouth 2 (two) times daily.   [name] OVER THE COUNTER MEDICATION Place 2 drops under the tongue 2 (two) times daily. CBD tincture/hemp flower extract   . OXYGEN Inhale 2 L into the lungs continuous.   . polyethylene glycol (MIRALAX / GLYCOLAX) packet Take by mouth See admin instructions. Mix 3 teaspoonsful (15 mls) in water and drink every evening   . potassium chloride SA (K-DUR,KLOR-CON) 20 MEQ tablet Take 1 tablet (20 mEq total) by mouth daily. 10/28/2017: #3 filled 10/25/17 - pt took last dose this morning  . PROAIR HFA 108 (90 Base) MCG/ACT inhaler Inhale 2 puffs into the lungs every 6 (six) hours as needed for wheezing or shortness of breath. 11/14/2017: Takes 3-4 times per day  . ranitidine (ZANTAC) 75 MG tablet Take 75 mg by mouth daily.   [name] thiamine 100 MG tablet Take 1 tablet (100 mg total) by mouth daily.   . vitamin E (VITAMIN E) 400 UNIT capsule Take 400 Units by mouth daily.   . Wheat Dextrin (BENEFIBER PO) Take 20 mLs by mouth daily. Mix 4 teaspoon (20 ml) in water and drink daily   . diltiazem (CARDIZEM) 30 MG tablet Take 1 tablet (30 mg total) by mouth every 8 (eight) hours. (Patient not taking: Reported on 11/14/2017) 11/14/2017: Patient reports that he was advised to not take medication unless his BP is greater than 100. Reports BP not greater than 100 since hospital discharge. Reports BP runs in the 70-80's  . varenicline (CHANTIX PAK) 0.5 MG X 11 & 1 MG X 42 tablet Take 0.5 mg by mouth See admin instructions. Take one tablet (0.5 mg)  by mouth once daily for 3 days, then increase to one tablet (0.5 mg)  twice daily for 4 days, then increase to  one tablet (1 mg) twice daily for 21 days 10/28/2017: Pt has one week left of tapered course   No facility-administered encounter medications on file as of 11/14/2017.      Functional Status:   In your present state of health, do you have any difficulty performing the following activities: 11/14/2017 10/29/2017  Hearing? Y -  Comment wearing hearing aids. -  Vision? Y -  Comment wears glasses -  Difficulty concentrating or making decisions? N -  Walking or climbing stairs? Y -  Comment dizziness -  Dressing or bathing? N -  Doing errands, shopping? Kevin Watkins  Comment currently not driving Facilities manager and eating ? Y -  Using the Toilet? N -  In the past six months, have you accidently leaked urine? N -  Do you have problems with loss of bowel control? N -  Managing your Medications? Y -  Comment expensive -  Managing your Finances? N -  Housekeeping or managing your Housekeeping? N -  Some recent data might be hidden    Fall/Depression Screening:    Fall Risk  11/14/2017  Falls in the past year? Yes  Number falls in past yr: 2 or more  Injury with Fall? Yes  Comment bruisies  Risk Factor Category  High Fall Risk  Risk for fall due to : History of fall(s);Medication side effect  Follow up Falls evaluation completed;Falls prevention discussed   PHQ 2/9 Scores 11/14/2017 01/04/2017  PHQ - 2 Score 1 0    Assessment:   (1) reviewed [facility] program and transition of care.  Consent on file. Reviewed with patient who declines any needed changes.  (2) Heart failure:  Significant low blood pressure since hospital discharge with increased dizziness. Patient reports increased swelling in legs today.  Feels weak.  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 Reports breathing is at baseline right now. Using nebulizer and inhalers as needed. (4) fall risk (5) patient reports medications are expensive.  (6) hypotension and dizziness.  Patient refusing to go to the hospital and states that the last 6 days he was at the hospital almost made him lose his mind. States that he can not do it again.   Plan:  (1) reviewed consent and no changes needed.  Consent in chart.  Reviewed  transition of care program and weekly outreaches.  (2) Reviewed importance of daily weights, following low salt diet and taking medications as prescribed. Placed call to Dr. Kennon Holter office and reported findings of hypotension. Reviewed CHF zones.  (3) reviewed Copd zones and when to call MD.  Encouraged patient to continue to avoid smoking.  (4) Reviewed fall risk and bleeding risk. (5) referral to [facility]. (6) reviewed advice from cardiology office. Encouraged patient to go to the hospital. Reviewed risk and benefits of getting emergency treatment.  Patient voiced understand of risk and states that he is undecided about what to do. Wife and daughter present and also encouraged patient to go to the hospital.   Care planning and goal setting during home visit and patients primary goal is avoid a readmission.   This note and barrier letter sent to MD. [facility] CM Care Plan Problem One     Most Recent Value  Care Plan Problem One  Recent admission for GI bleed, Copd and heart failure.   Role Documenting the Problem One  Care Management [place] for Problem One  Active  [facility] Long Term Goal   Patient will report no readmissions to the hospital in the next 90 days.Margie Billet Long Term Goal Start Date  11/14/17  Interventions for Problem One Long Term Goal  reviewed discharge instructions. Reviewed CHF, COPD and A fib zones. Provided [facility] calendar.  Placed call to Dr. Gwenlyn Found and reports findings from home visit with hypotensiion  [facility] CM Short Term Goal #1   Patient will report monitoring BP several times per day and recording in [facility] calendar for the next 30 days.   [facility] CM Short Term Goal #1 Start Date  11/14/17  Interventions for Short Term Goal #1  Reviewed importance of recording BP reading and call MD for low readings.   [facility] CM Short Term Goal #2   Patient will report decreased number of dizzy spells in the next 7 days.   [facility] CM Short Term Goal #2 Start Date  11/14/17  Interventions for  Short Term Goal #2  Reviewed recommendation from MD office. Encouraged patient to go to the ED. Reviewed concerns and risk of not going to the hospital to include death.      Tomasa Rand, RN, BSN, CEN [facility] [employer] [phone]

## 2017-11-15 DIAGNOSIS — I300 Acute nonspecific idiopathic pericarditis: Secondary | ICD-10-CM | POA: Diagnosis not present

## 2017-11-16 ENCOUNTER — Other Ambulatory Visit: Payer: Self-pay

## 2017-11-16 NOTE — Addendum Note (Signed)
Addended by: Thana Ates on: 11/16/2017 04:06 PM   Modules accepted: Orders

## 2017-11-16 NOTE — Patient Outreach (Signed)
Transition of care/ home visit follow up:  Placed call to patient and spoke with wife as patient has company. Wife reports blood pressure still remains low. 70's.  Wife states that patient did decrease his metoprolol and now has less dizziness.   PLAN: reviewed with wife importance of getting medical attention for low blood pressure. Reviewed pending appointment with Dr. Gwenlyn Found.  Will continue weekly transition of care calls.   Tomasa Rand, RN, BSN, CEN [facility] [employer] [phone]

## 2017-11-17 ENCOUNTER — Other Ambulatory Visit: Payer: Self-pay | Admitting: Pharmacist

## 2017-11-17 NOTE — Patient Outreach (Addendum)
[name]) Care Management  11/17/2017  Kevin Watkins 30-May-1941 [id number]   Subjective:  Patient was called regarding suspected medication side effects (hypotension) and medication assistance with Eliquis. HIPAA identifiers were obtained. Patient is a 77 year old male with multiple medical conditions including but not limited to:  CAD, dyslipidemia, essential hypertension, CHF, A Fib, CKD stage III,iron deficiency anemia, and very recently stopped smoking.  Patient manages his medications on his own.  When asked about his symptoms of hypotension, he said his Dr. Decreased his Metoprolol dose 50%.   Objective: Blood pressure (taken by Home Health Nurse:  76/50)   Medications Reviewed Today    Reviewed by Elayne Guerin, [facility] (Pharmacist) on 11/17/17 at 1346  Med List Status: <None>  Medication Order Taking? Sig Documenting Provider Last Dose Status Informant  apixaban (ELIQUIS) 5 MG TABS tablet [id number] Yes Take 1 tablet (5 mg total) by mouth 2 (two) times daily. Lorretta Harp, MD Taking Active   aspirin EC 81 MG tablet [id number] Yes Take 81 mg by mouth daily. [provider] Taking Active Self  atorvastatin (LIPITOR) 40 MG tablet [id number] Yes Take 40 mg by mouth daily at 6 PM.  [provider] Taking Active Self  B Complex-Biotin-FA (VITAMIN B50 COMPLEX PO) [id number] No Take 1 tablet by mouth daily. [provider] Unknown Active Self  diltiazem (CARDIZEM) 30 MG tablet [id number] No Take 1 tablet (30 mg total) by mouth every 8 (eight) hours.  Patient not taking:  Reported on 11/14/2017   Dessa Phi, DO Not Taking Active            Med Note Thana Ates   Tue Nov 14, 2017 10:57 AM) Patient reports that he was advised to not take medication unless his BP is greater than 100. Reports BP not greater than 100 since hospital discharge. Reports BP runs in the 70-80's  diphenhydrAMINE (BENADRYL) 25 MG tablet [id number] Yes Take 25-50 mg by  mouth See admin instructions. Take two capsules (50 mg) by mouth daily at bedtime, may also take one or two capsule (25-50 mg) during the night as needed for sleep [provider] Taking Active Self  ferrous sulfate 325 (65 FE) MG tablet [id number] Yes Take 1 tablet (325 mg total) by mouth 2 (two) times daily with a meal. Sherwood Gambler, MD Taking Active Self  folic acid (FOLVITE) 570 MCG tablet [id number] Yes Take 800 mcg by mouth daily. [provider] Taking Active Self  furosemide (LASIX) 40 MG tablet [id number] Yes Take 1 tablet (40 mg total) by mouth daily. Dessa Phi, DO Taking Active   Garlic 2263 MG CAPS [id number] Yes Take 1,000 mg by mouth daily.  [provider] Taking Active Self  guaiFENesin-dextromethorphan (ROBITUSSIN DM) 100-10 MG/5ML syrup [id number] Yes Take 5 mLs by mouth every 4 (four) hours as needed for cough. Alphonzo Grieve, MD Taking Active Other  ipratropium-albuterol (DUONEB) 0.5-2.5 (3) MG/3ML SOLN [id number] Yes Take 3 mLs by nebulization 4 (four) times daily.  [provider] Taking Active Self           Med Note (Bickhart, HEATHER C   Thu Mar 23, 2017 12:13 PM)    Lactobacillus (PROBIOTIC ACIDOPHILUS PO) [id number] Yes Take 1 tablet by mouth daily. [provider] Taking Active Self  metoprolol tartrate (LOPRESSOR) 25 MG tablet [id number] Yes Take 1 tablet (25 mg total) by mouth 2 (two) times daily. Dessa Phi, DO Taking Active   Omega-3 Fatty Acids ([place]  OIL) 1200 MG CAPS [id number] Yes Take 1,200 mg by mouth 2 (two) times daily. [provider] Taking Active Self  OVER THE COUNTER MEDICATION [id number] Yes Place 2 drops under the tongue 2 (two) times daily. CBD tincture/hemp flower extract [provider] Taking Active Self  OXYGEN [id number] Yes Inhale 2 L into the lungs continuous. [provider] Taking Active Self  polyethylene glycol Merril Abbe / GLYCOLAX) packet [id number] Yes Take by mouth See admin  instructions. Mix 3 teaspoonsful (15 mls) in water and drink every evening [provider] Taking Active Self  potassium chloride SA (K-DUR,KLOR-CON) 20 MEQ tablet [id number] Yes Take 1 tablet (20 mEq total) by mouth daily. Sherwood Gambler, MD Taking Active Self           Med Note Elayne Guerin   Fri Nov 17, 2017  1:42 PM)    PROAIR HFA 108 [phone] Base) MCG/ACT inhaler [id number] Yes Inhale 2 puffs into the lungs every 6 (six) hours as needed for wheezing or shortness of breath. Alphonzo Grieve, MD Taking Active Self           Med Note Tomasa Rand U   Tue Nov 14, 2017 11:01 AM) Takes 3-4 times per day  ranitidine (ZANTAC) 75 MG tablet [id number] Yes Take 75 mg by mouth daily. [provider] Taking Active Self  thiamine 100 MG tablet [id number] Yes Take 1 tablet (100 mg total) by mouth daily. Lorretta Harp, MD Taking Active Self  varenicline (CHANTIX PAK) 0.5 MG X 11 & 1 MG X 42 tablet [id number] Yes Take 0.5 mg by mouth See admin instructions. Take one tablet (0.5 mg)  by mouth once daily for 3 days, then increase to one tablet (0.5 mg)  twice daily for 4 days, then increase to  one tablet (1 mg) twice daily for 21 days [provider] Taking Active Self           Med Note (ATKINS, HEATHER L   Sat Oct 28, 2017  9:39 PM) Pt has one week left of tapered course  vitamin E (VITAMIN E) 400 UNIT capsule [id number] Yes Take 400 Units by mouth daily. [provider] Taking Active Self  Wheat Dextrin (BENEFIBER PO) [id number] Yes Take 20 mLs by mouth daily. Mix 4 teaspoon (20 ml) in water and drink daily [provider] Taking Active Self         Assessment: Patient's medications were reviewed via telephone.   Date Discharged from Hospital: 11/03/17 Date Medication Reconciliation Performed: 11/20/2017  New Medications at Discharge: (delete if applicable)  Diltiazem 30 mg 1 capsule every 8 hours    Drugs sorted by  system:  Neurologic/Psychologic:  Cardiovascular: Eliquis Aspirin  Atorvastatin Diltiazem Furosemide Metoprolol Omega 3 Fatty Acids  Pulmonary/Allergy: Dextromethorphan/Guaifenesin Diphenhydramine Albuterol/Ipratropium ProAir Oxygen  Gastrointestinal: Ranitidine Polyethylene Glycol Benefiber Lactobacillus   Vitamins/Minerals: Ferrous Sulfate Folic Acid Potassium CL Thiamine Garlic   Miscellaneous: Chantix CBD drops   Medications to avoid in the elderly:  Diphenhydramine  Medication Assistance Program:  -patient has [employer]age -patient has spent $903.22 out of pocket. -He MAY qualify to receive Eliquis at no cost from [employer] but their program requires patients to spend at least 3% of their income on medication expenses.  -Patient would need to spend $1521 to meet the requirement -Patient MAY qualify to receive Proventil form [employer].  His provider would have to approve the patient being switched from ProAir to Proventil.  Plan: -route note to patient's  PCP, requesting they sign and fax/mail the patient assistance applications.  -Follow up with the patient in 2 weeks to be sure he has the applications and understands where his spending needs to be to qualify.  Elayne Guerin, PharmD, [place] Clinical Pharmacist [phone]

## 2017-11-18 DIAGNOSIS — J449 Chronic obstructive pulmonary disease, unspecified: Secondary | ICD-10-CM | POA: Diagnosis not present

## 2017-11-18 DIAGNOSIS — I509 Heart failure, unspecified: Secondary | ICD-10-CM | POA: Diagnosis not present

## 2017-11-18 DIAGNOSIS — D649 Anemia, unspecified: Secondary | ICD-10-CM | POA: Diagnosis not present

## 2017-11-18 DIAGNOSIS — Z87891 Personal history of nicotine dependence: Secondary | ICD-10-CM | POA: Diagnosis not present

## 2017-11-18 DIAGNOSIS — R040 Epistaxis: Secondary | ICD-10-CM | POA: Diagnosis not present

## 2017-11-20 ENCOUNTER — Ambulatory Visit: Payer: PPO | Admitting: Emergency Medicine

## 2017-11-20 ENCOUNTER — Other Ambulatory Visit: Payer: Self-pay | Admitting: Internal Medicine

## 2017-11-20 ENCOUNTER — Encounter: Payer: Self-pay | Admitting: Emergency Medicine

## 2017-11-20 ENCOUNTER — Other Ambulatory Visit: Payer: Self-pay | Admitting: Pharmacy Technician

## 2017-11-20 DIAGNOSIS — J449 Chronic obstructive pulmonary disease, unspecified: Secondary | ICD-10-CM

## 2017-11-20 DIAGNOSIS — J9611 Chronic respiratory failure with hypoxia: Secondary | ICD-10-CM | POA: Diagnosis not present

## 2017-11-20 DIAGNOSIS — I5032 Chronic diastolic (congestive) heart failure: Secondary | ICD-10-CM | POA: Diagnosis not present

## 2017-11-20 DIAGNOSIS — I10 Essential (primary) hypertension: Secondary | ICD-10-CM

## 2017-11-20 DIAGNOSIS — R0609 Other forms of dyspnea: Secondary | ICD-10-CM | POA: Diagnosis not present

## 2017-11-20 NOTE — Assessment & Plan Note (Signed)
Appears to be clinically stable with regard to his COPD at this time.  Not much in the way wheezing, coughing.  He does have exertional dyspnea.  Continue his DuoNeb 4 times a day.  Also continues oxygen supplementation as ordered.  I will not make any bronchodilator changes at this time.

## 2017-11-20 NOTE — Assessment & Plan Note (Signed)
He is having periods of hypotension with associated dyspnea and dizziness.  He has a good record today of blood pressures in the 80s to 90s, sometimes even the 70s.  He was 110/72 on presentation here today.  He is been holding his diltiazem based on systolic blood pressure appropriately.  He follows up with Dr. Gwenlyn Found tomorrow.

## 2017-11-20 NOTE — Patient Instructions (Addendum)
Agree with following up with Dr. Alvester Chou on 5/14 to review your blood pressure and your blood pressure medications. Please continue albuterol/ipratropium (DuoNeb) nebulizers 4 times a day as you have been taking them. Wear your oxygen At 2 L/min reliably with exertion and also while sleeping. Follow with Dr Lamonte Sakai in 6 months or sooner if you have any problems

## 2017-11-20 NOTE — Patient Outreach (Signed)
[facility]) Care Management  11/20/2017  Kevin Watkins 09-01-1940 [id number]   Received [place] patient assistance referrals from [employer] for patients Eliquis and Proventil HFA inhaler. Will mail patient portions of both applications out on 76/19. Faxed provider portion of [employer] (Eliquis, Dr. Gwenlyn Found) on 05/13. Will mail out provider portion of [employer] (Proventil, Dr. Lamonte Sakai) on 05/14.  Will follow up with patient in 7-10 business days to verify receipt of applications  Maud Deed. [place], [place] Management [phone]

## 2017-11-20 NOTE — Progress Notes (Signed)
@Patient  ID: Kevin Watkins, male    DOB: 1941-02-10, 77 y.o.   MRN: [id number]  Chief Complaint  Patient presents with  . COPD    Referring provider: Ronita Hipps, MD  HPI: 77 year old male smoker seen for pulmonary critical care consult during hospitalization June 2018 with acute hypoxic and hypercarbic respiratory failure with suspected COPD exacerbation   ROV 10/12/17 --77 year old man, former smoker with severe COPD and associated nocturnal and exertional hypoxemia.  He has been on 2 L/min.  He reports that he had been doing well until about 1 week ago when he developed low energy, had cough was about the same. Mucous brown but about at his baseline. He was given prednisone by his PCP last week. She also decreased his BP medication at that time. He is on Anoro. He uses albuterol a few times a day. He uses Duoneb 4x a day on a schedule. He describes poor sleep, insomnia.   ROV 11/20/17 --this is a follow-up visit for patient with a history of severe COPD and associated hypoxemic respiratory failure.  He uses oxygen with exertion and at night while sleeping.  He had been on both DuoNeb's on a schedule as well as Anoro, I stopped the Anoro at his last visit. He was admitted end of April with evidence hypotension and anemia. He underwent reassuring RGD and CSY. He reports that his BP is low on a daily basis > often the 80's. Planning to see [name] tomorrow. He is not having any cough, wheeze. Good compliance with his O2.     Allergies  Allergen Reactions  . Ambien [Zolpidem Tartrate] Other (See Comments)    confusion    Immunization History  Administered Date(s) Administered  . Influenza-Unspecified 04/01/2014, 04/07/2015, 03/30/2016, 04/10/2017  . Pneumococcal-Unspecified 06/21/2010, 01/27/2014  . Td 03/01/2012  . Zoster 03/05/2012    Past Medical History:  Diagnosis Date  . CAD in native artery 06/2000   BMS to the RCA; known 60% mid dominant RCA stenosis and 50-60% OM 2  branch stenosis normal LV function. Last Myoview May 2011 negative for ischemia.  . CHF (congestive heart failure) ([employer])   . COPD (chronic obstructive pulmonary disease) ([place])   . Dyslipidemia, goal LDL below 70   . Dyspnea   . Edema   . EtOH dependence ([place])    Family reported  . Hypertension   . Medical history non-contributory   . Oxygen deficiency   . PAD (peripheral artery disease) (HCC)    iliac, SCA, Innominate, and renal PTA  . Tobacco abuse     Tobacco History: Social History   Tobacco Use  Smoking Status Former Smoker  . Packs/day: 1.00  . Years: 65.00  . Pack years: 65.00  . Types: Cigarettes  . Last attempt to quit: 08/11/2016  . Years since quitting: 1.2  Smokeless Tobacco Never Used  Tobacco Comment   pt states he has quit x 3 weeks    Counseling given: Not Answered Comment: pt states he has quit x 3 weeks    Outpatient Encounter Medications as of 11/20/2017  Medication Sig  . apixaban (ELIQUIS) 5 MG TABS tablet Take 1 tablet (5 mg total) by mouth 2 (two) times daily.  [name] aspirin EC 81 MG tablet Take 81 mg by mouth daily.  [name] atorvastatin (LIPITOR) 40 MG tablet Take 40 mg by mouth daily at 6 PM.   . B Complex-Biotin-FA (VITAMIN B50 COMPLEX PO) Take 1 tablet by mouth daily.  . diphenhydrAMINE (  BENADRYL) 25 MG tablet Take 25-50 mg by mouth See admin instructions. Take two capsules (50 mg) by mouth daily at bedtime, may also take one or two capsule (25-50 mg) during the night as needed for sleep  . ferrous sulfate 325 (65 FE) MG tablet Take 1 tablet (325 mg total) by mouth 2 (two) times daily with a meal.  . folic acid (FOLVITE) 161 MCG tablet Take 800 mcg by mouth daily.  . furosemide (LASIX) 40 MG tablet Take 1 tablet (40 mg total) by mouth daily.  . Garlic 0960 MG CAPS Take 1,000 mg by mouth daily.   [name] guaiFENesin-dextromethorphan (ROBITUSSIN DM) 100-10 MG/5ML syrup Take 5 mLs by mouth every 4 (four) hours as needed for cough.  [name] ipratropium-albuterol (DUONEB)  0.5-2.5 (3) MG/3ML SOLN Take 3 mLs by nebulization 4 (four) times daily.   . Lactobacillus (PROBIOTIC ACIDOPHILUS PO) Take 1 tablet by mouth daily.  . metoprolol tartrate (LOPRESSOR) 25 MG tablet Take 1 tablet (25 mg total) by mouth 2 (two) times daily. (Patient taking differently: Take 12.5 mg by mouth 2 (two) times daily. )  . Omega-3 Fatty Acids (FISH OIL) 1200 MG CAPS Take 1,200 mg by mouth 2 (two) times daily.  [name] OVER THE COUNTER MEDICATION Place 2 drops under the tongue 2 (two) times daily. CBD tincture/hemp flower extract  . OXYGEN Inhale 2 L into the lungs continuous.  . polyethylene glycol (MIRALAX / GLYCOLAX) packet Take by mouth See admin instructions. Mix 3 teaspoonsful (15 mls) in water and drink every evening  . potassium chloride SA (K-DUR,KLOR-CON) 20 MEQ tablet Take 1 tablet (20 mEq total) by mouth daily.  [name] PROAIR HFA 108 (90 Base) MCG/ACT inhaler Inhale 2 puffs into the lungs every 6 (six) hours as needed for wheezing or shortness of breath.  . ranitidine (ZANTAC) 75 MG tablet Take 75 mg by mouth daily.  [name] thiamine 100 MG tablet Take 1 tablet (100 mg total) by mouth daily.  . varenicline (CHANTIX PAK) 0.5 MG X 11 & 1 MG X 42 tablet Take 0.5 mg by mouth See admin instructions. Take one tablet (0.5 mg)  by mouth once daily for 3 days, then increase to one tablet (0.5 mg)  twice daily for 4 days, then increase to  one tablet (1 mg) twice daily for 21 days  . vitamin E (VITAMIN E) 400 UNIT capsule Take 400 Units by mouth daily.  . Wheat Dextrin (BENEFIBER PO) Take 20 mLs by mouth daily. Mix 4 teaspoon (20 ml) in water and drink daily  . diltiazem (CARDIZEM) 30 MG tablet Take 1 tablet (30 mg total) by mouth every 8 (eight) hours. (Patient not taking: Reported on 11/14/2017)   No facility-administered encounter medications on file as of 11/20/2017.      Review of Systems As per HPI   Physical Exam  BP 110/72 (BP Location: Left Arm, Cuff Size: Normal)   Pulse (!) 105   Ht 5\' 7"   (1.702 m)   Wt 193 lb (87.5 kg)   SpO2 93%   BMI 30.23 kg/m   Gen: Pleasant, well-nourished, in no distress,  normal affect  ENT: No lesions,  mouth clear,  oropharynx clear, no postnasal drip  Neck: No JVD, no stridor  Lungs: No use of accessory muscles, very distant, no wheezing, no crackles  Cardiovascular: RRR, heart sounds normal, no murmur or gallops, no peripheral edema  Musculoskeletal: No deformities, no cyanosis or clubbing  Neuro: alert, non focal  Skin: Warm, no lesions  or rash       Assessment & Plan:   Essential hypertension He is having periods of hypotension with associated dyspnea and dizziness.  He has a good record today of blood pressures in the 80s to 90s, sometimes even the 70s.  He was 110/72 on presentation here today.  He is been holding his diltiazem based on systolic blood pressure appropriately.  He follows up with Dr. Gwenlyn Found tomorrow.   COPD (chronic obstructive pulmonary disease) (HCC) Appears to be clinically stable with regard to his COPD at this time.  Not much in the way wheezing, coughing.  He does have exertional dyspnea.  Continue his DuoNeb 4 times a day.  Also continues oxygen supplementation as ordered.  I will not make any bronchodilator changes at this time.  Chronic respiratory failure with hypoxia (HCC) Continue oxygen at 2 L/min with exertion and while sleeping.  Baltazar Apo, MD, PhD 11/20/2017, 11:33 AM [facility] [phone] or if no answer [phone]

## 2017-11-20 NOTE — Assessment & Plan Note (Signed)
Continue oxygen at 2 L/min with exertion and while sleeping.

## 2017-11-21 ENCOUNTER — Ambulatory Visit (INDEPENDENT_AMBULATORY_CARE_PROVIDER_SITE_OTHER): Payer: PPO | Admitting: Cardiovascular Disease

## 2017-11-21 ENCOUNTER — Encounter: Payer: Self-pay | Admitting: Cardiovascular Disease

## 2017-11-21 VITALS — BP 80/50 | HR 94 | Ht 67.0 in | Wt 192.0 lb

## 2017-11-21 DIAGNOSIS — I480 Paroxysmal atrial fibrillation: Secondary | ICD-10-CM | POA: Diagnosis not present

## 2017-11-21 DIAGNOSIS — I10 Essential (primary) hypertension: Secondary | ICD-10-CM | POA: Diagnosis not present

## 2017-11-21 DIAGNOSIS — I779 Disorder of arteries and arterioles, unspecified: Secondary | ICD-10-CM

## 2017-11-21 DIAGNOSIS — I739 Peripheral vascular disease, unspecified: Principal | ICD-10-CM

## 2017-11-21 MED ORDER — FLUDROCORTISONE ACETATE 0.1 MG PO TABS: 0.1000 mg | ORAL_TABLET | Freq: Every day | ORAL | 3 refills | Status: DC

## 2017-11-21 NOTE — Assessment & Plan Note (Signed)
Discontinue tobacco abuse September 2018.  He does have COPD on oxygen therapy.

## 2017-11-21 NOTE — Assessment & Plan Note (Signed)
History of atrial atrial fibrillation rate controlled on low-dose beta-blockade and on Eliquis oral and coagulation.  He does have moderate anemia and has had a negative upper and lower endoscopy and colonoscopy with suggestion to perform pill endoscopy.  This may be contributing to his hypotension as well.

## 2017-11-21 NOTE — Assessment & Plan Note (Signed)
History of carotid disease with moderately high-grade right ICA stenosis, occluded right innominate and subclavian artery with only mild left ICA stenosis.

## 2017-11-21 NOTE — Progress Notes (Signed)
11/21/2017 Kevin Watkins   [birth year]  [id number]  Primary Physician Ronita Hipps, MD Primary Cardiologist: Lorretta Harp MD Kevin Watkins, [place]  HPI:  Kevin Watkins is a 77 y.o.    mildly overweight married Caucasian male father of 64, grandfather to 2 grandchildren who I last saw in the office  06/09/2017.[name] He has a history of CAD and PVOD. I stented his right coronary artery as a 3.5 mm x 12 mm long Medtronic S7 bare metal stent in December of 2001. At that time he did have a 60% mid dominant RCA stenosis as well as a 50-60% OM2 branch stenosis with normal LV function. He had a negative Myoview Nov 28, 2009 and denies chest pain or shortness of breath. I stented his right subclavian and innominate vessel as well as his right renal artery. He has no known ostial left common carotid artery stenosis and left subclavian artery stenosis as well. He is cutting down his cigarettes from 1 pack per day to 1-2 packs per week and is using an Engineer, manufacturing. His other problem include hypertension and hyperlipidemia. We are following his various Doppler studies in our office.he saw Kerin Ransom PA-C in the office one week ago with complaints of dyspnea or lower extremity edema. Apparently he was advised to liberalize his salt intake. We advised him to avoid salt, and increased his furosemide as well as decreased his amlodipine. He lost 34 pounds. His edema has somewhat improved. We have continued to follow his basic metabolic panel and adjust his diuretics as necessary. He does weigh himself on a daily basis and adjust his diuretics by doubling them for 2-3 days if his weight begins to increase. We've been following his carotid and renal Doppler studies. 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 His right internal carotid artery stenosis has remained stable in the moderately severe range. He denies chest pain, shortness of breath or  claudication. He was seen by Kerin Ransom 06/23/16 and was in atrial fibrillation though he converted. He was begun on oral anticoagulation. He was also recently seen by Lajean Saver MD in the emergency room the following day with shortness of breath and edema. He does admit to dietary indiscretion with regard to salt.He was recently hospitalized 12/17/16 through the 16th expiratory failure, interstitial pneumonia and diastolic heart failure. He was briefly intubated from BiPAP, diuresed and placed on antibiotics and steroid therapy. He was discharged home on continuous O2 therapy which she currently wears only at night. Since I saw him 6 months ago he's remained stable.  Still avoid salt.  He stopped smoking 9 months ago.  He is on chronic O2 therapy.  He has been hospitalized for respiratory failure and was found to be significantly anemic requiring transfusion, colonoscopy and endoscopy.    Current Meds  Medication Sig  . apixaban (ELIQUIS) 5 MG TABS tablet Take 1 tablet (5 mg total) by mouth 2 (two) times daily.  [name] aspirin EC 81 MG tablet Take 81 mg by mouth daily.  [name] atorvastatin (LIPITOR) 40 MG tablet Take 40 mg by mouth daily at 6 PM.   . B Complex-Biotin-FA (VITAMIN B50 COMPLEX PO) Take 1 tablet by mouth daily.  [name] diltiazem (CARDIZEM) 30 MG tablet Take 1 tablet (30 mg total) by mouth every 8 (eight) hours.  . diphenhydrAMINE (BENADRYL) 25 MG tablet Take 25-50 mg by mouth See admin instructions. Take two  capsules (50 mg) by mouth daily at bedtime, may also take one or two capsule (25-50 mg) during the night as needed for sleep  . ferrous sulfate 325 (65 FE) MG tablet Take 1 tablet (325 mg total) by mouth 2 (two) times daily with a meal.  . folic acid (FOLVITE) 809 MCG tablet Take 800 mcg by mouth daily.  . furosemide (LASIX) 40 MG tablet Take 1 tablet (40 mg total) by mouth daily.  . Garlic 9833 MG CAPS Take 1,000 mg by mouth daily.   [name] guaiFENesin-dextromethorphan (ROBITUSSIN DM) 100-10 MG/5ML  syrup Take 5 mLs by mouth every 4 (four) hours as needed for cough.  [name] ipratropium-albuterol (DUONEB) 0.5-2.5 (3) MG/3ML SOLN Take 3 mLs by nebulization 4 (four) times daily.   . Lactobacillus (PROBIOTIC ACIDOPHILUS PO) Take 1 tablet by mouth daily.  . metoprolol tartrate (LOPRESSOR) 25 MG tablet Take 1 tablet (25 mg total) by mouth 2 (two) times daily. (Patient taking differently: Take 12.5 mg by mouth 2 (two) times daily. )  . Omega-3 Fatty Acids (FISH OIL) 1200 MG CAPS Take 1,200 mg by mouth 2 (two) times daily.  [name] OVER THE COUNTER MEDICATION Place 2 drops under the tongue 2 (two) times daily. CBD tincture/hemp flower extract  . OXYGEN Inhale 2 L into the lungs continuous.  . polyethylene glycol (MIRALAX / GLYCOLAX) packet Take by mouth See admin instructions. Mix 3 teaspoonsful (15 mls) in water and drink every evening  . potassium chloride SA (K-DUR,KLOR-CON) 20 MEQ tablet Take 1 tablet (20 mEq total) by mouth daily.  [name] PROAIR HFA 108 (90 Base) MCG/ACT inhaler Inhale 2 puffs into the lungs every 6 (six) hours as needed for wheezing or shortness of breath.  . ranitidine (ZANTAC) 75 MG tablet Take 75 mg by mouth daily.  [name] thiamine 100 MG tablet Take 1 tablet (100 mg total) by mouth daily.  . varenicline (CHANTIX PAK) 0.5 MG X 11 & 1 MG X 42 tablet Take 0.5 mg by mouth See admin instructions. Take one tablet (0.5 mg)  by mouth once daily for 3 days, then increase to one tablet (0.5 mg)  twice daily for 4 days, then increase to  one tablet (1 mg) twice daily for 21 days  . vitamin E (VITAMIN E) 400 UNIT capsule Take 400 Units by mouth daily.  . Wheat Dextrin (BENEFIBER PO) Take 20 mLs by mouth daily. Mix 4 teaspoon (20 ml) in water and drink daily     Allergies  Allergen Reactions  . Ambien [Zolpidem Tartrate] Other (See Comments)    confusion    Social History   Socioeconomic History  . Marital status: Married    Spouse name: Not on file  . Number of children: Not on file  . Years of  education: Not on file  . Highest education level: Not on file  Occupational History  . Not on file  Social Needs  . Financial resource strain: Not on file  . Food insecurity:    Worry: Not on file    Inability: Not on file  . Transportation needs:    Medical: Not on file    Non-medical: Not on file  Tobacco Use  . Smoking status: Former Smoker    Packs/day: 1.00    Years: 65.00    Pack years: 65.00    Types: Cigarettes    Last attempt to quit: 08/11/2016    Years since quitting: 1.2  . Smokeless tobacco: Never Used  . Tobacco comment: pt  states he has quit x 3 weeks   Substance and Sexual Activity  . Alcohol use: Yes    Alcohol/week: 12.6 oz    Types: 21 Cans of beer per week    Comment: 1 beer every 2 weeks.   . Drug use: No  . Sexual activity: Not on file  Lifestyle  . Physical activity:    Days per week: Not on file    Minutes per session: Not on file  . Stress: Not on file  Relationships  . Social connections:    Talks on phone: Not on file    Gets together: Not on file    Attends religious service: Not on file    Active member of club or organization: Not on file    Attends meetings of clubs or organizations: Not on file    Relationship status: Not on file  . Intimate partner violence:    Fear of current or ex partner: Not on file    Emotionally abused: Not on file    Physically abused: Not on file    Forced sexual activity: Not on file  Other Topics Concern  . Not on file  Social History Narrative  . Not on file     Review of Systems: General: negative for chills, fever, night sweats or weight changes.  Cardiovascular: negative for chest pain, dyspnea on exertion, edema, orthopnea, palpitations, paroxysmal nocturnal dyspnea or shortness of breath Dermatological: negative for rash Respiratory: negative for cough or wheezing Urologic: negative for hematuria Abdominal: negative for nausea, vomiting, diarrhea, bright red blood per rectum, melena, or  hematemesis Neurologic: negative for visual changes, syncope, or dizziness All other systems reviewed and are otherwise negative except as noted above.    Blood pressure (!) 80/50, pulse 94, height 5\' 7"  (1.702 m), weight 192 lb (87.1 kg).  General appearance: alert and no distress Neck: no adenopathy, no JVD, supple, symmetrical, trachea midline, thyroid not enlarged, symmetric, no tenderness/mass/nodules and Bilateral carotid bruits right greater than left Lungs: clear to auscultation bilaterally Heart: irregularly irregular rhythm Extremities: extremities normal, atraumatic, no cyanosis or edema Pulses: 2+ and symmetric Skin: Skin color, texture, turgor normal. No rashes or lesions Neurologic: Alert and oriented X 3, normal strength and tone. Normal symmetric reflexes. Normal coordination and gait  EKG not performed today  ASSESSMENT AND PLAN:   CAD-RCA BMS 2001, low risk Myoview 2011 History of CAD status post RCA stenting myself December 2001 with a 3.5 mm x 12 mm long chronic bare-metal stent did have disease in his mid dominant RCA and OM 2 branch with normal LV function.  Denies chest pain.  PAD- multiple interventions- renal, iliac, SCA History of peripheral arterial disease with stenting of his right subclavian, innominate and renal artery.  Renal Dopplers performed a year ago did show moderate bilateral renal artery stenosis with normal renal dimensions.  Carotid Doppler showed an occluded right innominate, high-grade right internal carotid and only mild left ICA stenosis.  We will repeat carotid Doppler studies.  Tobacco abuse Discontinue tobacco abuse September 2018.  He does have COPD on oxygen therapy.  Dyslipidemia, goal LDL below 70 History of dyslipidemia on statin therapy.  Essential hypertension History of essential hypertension on low-dose beta-blockade with blood pressure measured today at 80/50.  He has had chronic hypotension recently and actually feels worse  when his blood pressure is this low.  He is also significantly anemic.  I am going to start him on low-dose Florinef.  Bilateral carotid artery  disease ([place]) History of carotid disease with moderately high-grade right ICA stenosis, occluded right innominate and subclavian artery with only mild left ICA stenosis.  Paroxysmal atrial fibrillation (HCC) History of atrial atrial fibrillation rate controlled on low-dose beta-blockade and on Eliquis oral and coagulation.  He does have moderate anemia and has had a negative upper and lower endoscopy and colonoscopy with suggestion to perform pill endoscopy.  This may be contributing to his hypotension as well.      Lorretta Harp MD FACP,FACC,FAHA, [facility] 11/21/2017 3:41 PM

## 2017-11-21 NOTE — Patient Instructions (Signed)
Medication Instructions:  Your physician has recommended you make the following change in your medication:  1) Florinef 0.1mg  tablet by mouth ONCE daily    Labwork: none  Testing/Procedures: Your physician has requested that you have a carotid duplex. This test is an ultrasound of the carotid arteries in your neck. It looks at blood flow through these arteries that supply the brain with blood. Allow one hour for this exam. There are no restrictions or special instructions.    Follow-Up: Your physician recommends that you schedule a follow-up appointment in: 4 weeks with [facility]  We request that you follow-up in: 3 months  with an extender and in 6 months with Dr Andria Rhein will receive a reminder letter in the mail two months in advance. If you don't receive a letter, please call our office to schedule the follow-up appointment.    Any Other Special Instructions Will Be Listed Below (If Applicable).   Dr. Gwenlyn Found would like you to check your blood pressure DAILY for the next 4 weeks.  Keep a journal of these daily BP and heart rate reading and and bring results with you to next appointment.  If you need a refill on your cardiac medications before your next appointment, please call your pharmacy.

## 2017-11-21 NOTE — Assessment & Plan Note (Signed)
History of CAD status post RCA stenting myself December 2001 with a 3.5 mm x 12 mm long chronic bare-metal stent did have disease in his mid dominant RCA and OM 2 branch with normal LV function.  Denies chest pain.

## 2017-11-21 NOTE — Assessment & Plan Note (Signed)
History of dyslipidemia on statin therapy 

## 2017-11-21 NOTE — Assessment & Plan Note (Signed)
History of peripheral arterial disease with stenting of his right subclavian, innominate and renal artery.  Renal Dopplers performed a year ago did show moderate bilateral renal artery stenosis with normal renal dimensions.  Carotid Doppler showed an occluded right innominate, high-grade right internal carotid and only mild left ICA stenosis.  We will repeat carotid Doppler studies.

## 2017-11-21 NOTE — Assessment & Plan Note (Signed)
History of essential hypertension on low-dose beta-blockade with blood pressure measured today at 80/50.  He has had chronic hypotension recently and actually feels worse when his blood pressure is this low.  He is also significantly anemic.  I am going to start him on low-dose Florinef.

## 2017-11-22 ENCOUNTER — Other Ambulatory Visit: Payer: Self-pay

## 2017-11-22 ENCOUNTER — Other Ambulatory Visit: Payer: Self-pay | Admitting: Cardiovascular Disease

## 2017-11-22 DIAGNOSIS — I6523 Occlusion and stenosis of bilateral carotid arteries: Secondary | ICD-10-CM

## 2017-11-22 NOTE — Patient Outreach (Signed)
Transition of care: Placed call to patient to follow up on progress. Patient is in the bed per wife. Wife states that patient saw cardiology yesterday and continues to have a low blood pressure. Reports patient was placed on a medication to bring up blood pressure. Wife reports patient took first pill today. Wife states she is does not know weight from today.  Reports patients breathing is at his normal. Reports patient feels bad today.  PLAN: encouraged wife to seek emergency care if needed. Reviewed importance of monitoring BP and calling MD if Blood pressure remains low. Will follow up in 1 week.  Tomasa Rand, RN, BSN, CEN [facility] [employer] [phone]

## 2017-11-23 ENCOUNTER — Encounter: Payer: Self-pay | Admitting: *Deleted

## 2017-11-23 ENCOUNTER — Telehealth: Payer: Self-pay | Admitting: Cardiovascular Disease

## 2017-11-23 NOTE — Telephone Encounter (Signed)
New message    Kevin Watkins from [facility] calling to report BP, and confirm patient can resume PT, light exercise  Pt c/o BP issue:  1. What are your last 5 BP readings? 80/50, 72/50, 88/60 2. Are you having any other symptoms (ex. Dizziness, headache, blurred vision, passed out)? dizziness 3. What is your medication issue? Low BP

## 2017-11-23 NOTE — Telephone Encounter (Signed)
Spoke with Laveda Abbe [facility]) - patient just saw Dr. Gwenlyn Found on Tuesday and was prescribed florinef.  Has only taken 1 dose.  BP readings were similar in the office at that time.   Advised that patient increase his sodium intake and give the medication a few days to work. Call if continued problems.

## 2017-11-23 NOTE — Telephone Encounter (Signed)
Spoke with Kevin Watkins form [facility] who states pt has been experiencing low BP along with feeling weak and light headed.  5/7 72/50   88/60 5/14 96/60 5/16 80/55  Ceil States pt has had minimal energy to PT therapy and wanted to know if they should continue PT. Also routing to Pharm D and MD for recommendation in regards to BP.

## 2017-11-27 DIAGNOSIS — I5032 Chronic diastolic (congestive) heart failure: Secondary | ICD-10-CM | POA: Diagnosis not present

## 2017-11-27 DIAGNOSIS — J9691 Respiratory failure, unspecified with hypoxia: Secondary | ICD-10-CM | POA: Diagnosis not present

## 2017-11-27 DIAGNOSIS — J449 Chronic obstructive pulmonary disease, unspecified: Secondary | ICD-10-CM | POA: Diagnosis not present

## 2017-11-27 DIAGNOSIS — R0609 Other forms of dyspnea: Secondary | ICD-10-CM | POA: Diagnosis not present

## 2017-11-28 DIAGNOSIS — R040 Epistaxis: Secondary | ICD-10-CM | POA: Diagnosis not present

## 2017-11-28 DIAGNOSIS — I110 Hypertensive heart disease with heart failure: Secondary | ICD-10-CM | POA: Diagnosis not present

## 2017-11-28 DIAGNOSIS — Z87891 Personal history of nicotine dependence: Secondary | ICD-10-CM | POA: Diagnosis not present

## 2017-11-28 DIAGNOSIS — I509 Heart failure, unspecified: Secondary | ICD-10-CM | POA: Diagnosis not present

## 2017-11-28 DIAGNOSIS — I2510 Atherosclerotic heart disease of native coronary artery without angina pectoris: Secondary | ICD-10-CM | POA: Diagnosis not present

## 2017-11-28 DIAGNOSIS — J449 Chronic obstructive pulmonary disease, unspecified: Secondary | ICD-10-CM | POA: Diagnosis not present

## 2017-11-28 DIAGNOSIS — I4891 Unspecified atrial fibrillation: Secondary | ICD-10-CM | POA: Diagnosis not present

## 2017-11-28 DIAGNOSIS — Z791 Long term (current) use of non-steroidal anti-inflammatories (NSAID): Secondary | ICD-10-CM | POA: Diagnosis not present

## 2017-11-29 ENCOUNTER — Other Ambulatory Visit: Payer: Self-pay

## 2017-11-29 NOTE — Patient Outreach (Addendum)
Transition of care. Today's Vitals   11/29/17 0934  Weight: 188 lb (85.3 kg)    Placed call to patient who reports that he continues to have low blood pressure. Reports BP is running 70 to 80's. Reports he continues to have dizzy spells. Reports that he is taking his florinef as prescribed.  Reports keeping a daily log.  Reports difficulty sleeping.  PLAN: encouraged patient to continue to self monitor and report low readings to MD. Reviewed importance of taking all medications as prescribed. Will send this note to MD to inform of hypotension.  Next follow up planned in 1 week via phone.  Tomasa Rand, RN, BSN, CEN [facility] [employer] [phone]

## 2017-12-01 ENCOUNTER — Other Ambulatory Visit: Payer: Self-pay | Admitting: Pharmacy Technician

## 2017-12-01 NOTE — Patient Outreach (Signed)
[facility]) Care Management  12/01/2017  Kevin Watkins 1940/12/04 [id number]  Successful outreach call to patients wife, [name], HIPAA identifiers verified. [name] stated they had received applications and is going to have a family member help her fill them out for the patient. I informed her that I could obtain all required documents except for their Proof of Income. [name] stated understanding and also stated that if she had any questions she would contact me or have a family member help her.  Will follow up with patient in 7-10 business days if paperwork has not been returned.  Maud Deed Thomson, [place] Management [phone]

## 2017-12-04 ENCOUNTER — Other Ambulatory Visit: Payer: Self-pay

## 2017-12-04 ENCOUNTER — Inpatient Hospital Stay (HOSPITAL_COMMUNITY)
Admission: EM | Admit: 2017-12-04 | Discharge: 2017-12-07 | DRG: 291 | Disposition: A | Discharge status: Home Health | Payer: PPO | Attending: Family Medicine | Admitting: Family Medicine

## 2017-12-04 ENCOUNTER — Emergency Department (HOSPITAL_COMMUNITY): Payer: PPO

## 2017-12-04 ENCOUNTER — Encounter (HOSPITAL_COMMUNITY): Payer: Self-pay

## 2017-12-04 DIAGNOSIS — D649 Anemia, unspecified: Secondary | ICD-10-CM | POA: Diagnosis not present

## 2017-12-04 DIAGNOSIS — F1020 Alcohol dependence, uncomplicated: Secondary | ICD-10-CM | POA: Diagnosis present

## 2017-12-04 DIAGNOSIS — I9589 Other hypotension: Secondary | ICD-10-CM | POA: Diagnosis present

## 2017-12-04 DIAGNOSIS — R0602 Shortness of breath: Secondary | ICD-10-CM | POA: Diagnosis not present

## 2017-12-04 DIAGNOSIS — Z7952 Long term (current) use of systemic steroids: Secondary | ICD-10-CM

## 2017-12-04 DIAGNOSIS — Z87891 Personal history of nicotine dependence: Secondary | ICD-10-CM | POA: Diagnosis not present

## 2017-12-04 DIAGNOSIS — J9611 Chronic respiratory failure with hypoxia: Secondary | ICD-10-CM | POA: Diagnosis present

## 2017-12-04 DIAGNOSIS — E785 Hyperlipidemia, unspecified: Secondary | ICD-10-CM | POA: Diagnosis present

## 2017-12-04 DIAGNOSIS — I130 Hypertensive heart and chronic kidney disease with heart failure and stage 1 through stage 4 chronic kidney disease, or unspecified chronic kidney disease: Secondary | ICD-10-CM | POA: Diagnosis not present

## 2017-12-04 DIAGNOSIS — Z79899 Other long term (current) drug therapy: Secondary | ICD-10-CM

## 2017-12-04 DIAGNOSIS — I739 Peripheral vascular disease, unspecified: Secondary | ICD-10-CM | POA: Diagnosis present

## 2017-12-04 DIAGNOSIS — N183 Chronic kidney disease, stage 3 unspecified: Secondary | ICD-10-CM | POA: Diagnosis present

## 2017-12-04 DIAGNOSIS — E669 Obesity, unspecified: Secondary | ICD-10-CM | POA: Diagnosis not present

## 2017-12-04 DIAGNOSIS — Z7982 Long term (current) use of aspirin: Secondary | ICD-10-CM | POA: Diagnosis not present

## 2017-12-04 DIAGNOSIS — I4891 Unspecified atrial fibrillation: Secondary | ICD-10-CM

## 2017-12-04 DIAGNOSIS — J449 Chronic obstructive pulmonary disease, unspecified: Secondary | ICD-10-CM | POA: Diagnosis not present

## 2017-12-04 DIAGNOSIS — E876 Hypokalemia: Secondary | ICD-10-CM | POA: Diagnosis not present

## 2017-12-04 DIAGNOSIS — J9620 Acute and chronic respiratory failure, unspecified whether with hypoxia or hypercapnia: Secondary | ICD-10-CM | POA: Diagnosis present

## 2017-12-04 DIAGNOSIS — I482 Chronic atrial fibrillation: Secondary | ICD-10-CM | POA: Diagnosis present

## 2017-12-04 DIAGNOSIS — J9621 Acute and chronic respiratory failure with hypoxia: Secondary | ICD-10-CM

## 2017-12-04 DIAGNOSIS — Z95828 Presence of other vascular implants and grafts: Secondary | ICD-10-CM

## 2017-12-04 DIAGNOSIS — I5032 Chronic diastolic (congestive) heart failure: Secondary | ICD-10-CM | POA: Diagnosis not present

## 2017-12-04 DIAGNOSIS — I5033 Acute on chronic diastolic (congestive) heart failure: Secondary | ICD-10-CM | POA: Diagnosis present

## 2017-12-04 DIAGNOSIS — Z955 Presence of coronary angioplasty implant and graft: Secondary | ICD-10-CM | POA: Diagnosis not present

## 2017-12-04 DIAGNOSIS — I481 Persistent atrial fibrillation: Secondary | ICD-10-CM | POA: Diagnosis not present

## 2017-12-04 DIAGNOSIS — I959 Hypotension, unspecified: Secondary | ICD-10-CM | POA: Diagnosis not present

## 2017-12-04 DIAGNOSIS — I4819 Other persistent atrial fibrillation: Secondary | ICD-10-CM | POA: Diagnosis present

## 2017-12-04 DIAGNOSIS — I2510 Atherosclerotic heart disease of native coronary artery without angina pectoris: Secondary | ICD-10-CM | POA: Diagnosis not present

## 2017-12-04 DIAGNOSIS — D509 Iron deficiency anemia, unspecified: Secondary | ICD-10-CM | POA: Diagnosis present

## 2017-12-04 DIAGNOSIS — Z888 Allergy status to other drugs, medicaments and biological substances status: Secondary | ICD-10-CM

## 2017-12-04 DIAGNOSIS — R269 Unspecified abnormalities of gait and mobility: Secondary | ICD-10-CM | POA: Diagnosis not present

## 2017-12-04 DIAGNOSIS — R05 Cough: Secondary | ICD-10-CM | POA: Diagnosis not present

## 2017-12-04 DIAGNOSIS — Z6829 Body mass index (BMI) 29.0-29.9, adult: Secondary | ICD-10-CM

## 2017-12-04 DIAGNOSIS — Z8249 Family history of ischemic heart disease and other diseases of the circulatory system: Secondary | ICD-10-CM

## 2017-12-04 DIAGNOSIS — Z9981 Dependence on supplemental oxygen: Secondary | ICD-10-CM

## 2017-12-04 DIAGNOSIS — D6489 Other specified anemias: Secondary | ICD-10-CM | POA: Diagnosis present

## 2017-12-04 DIAGNOSIS — I951 Orthostatic hypotension: Secondary | ICD-10-CM | POA: Diagnosis not present

## 2017-12-04 DIAGNOSIS — R0609 Other forms of dyspnea: Secondary | ICD-10-CM | POA: Diagnosis not present

## 2017-12-04 DIAGNOSIS — J9691 Respiratory failure, unspecified with hypoxia: Secondary | ICD-10-CM | POA: Diagnosis not present

## 2017-12-04 DIAGNOSIS — I10 Essential (primary) hypertension: Secondary | ICD-10-CM | POA: Diagnosis present

## 2017-12-04 DIAGNOSIS — Z7901 Long term (current) use of anticoagulants: Secondary | ICD-10-CM

## 2017-12-04 LAB — CBC
HCT: 24.7 % — ABNORMAL LOW (ref 39.0–52.0)
Hemoglobin: 7.8 g/dL — ABNORMAL LOW (ref 13.0–17.0)
MCH: 29.3 pg (ref 26.0–34.0)
MCHC: 31.6 g/dL (ref 30.0–36.0)
MCV: 92.9 fL (ref 78.0–100.0)
PLATELETS: 180 10*3/uL (ref 150–400)
RBC: 2.66 MIL/uL — AB (ref 4.22–5.81)
RDW: 20 % — AB (ref 11.5–15.5)
WBC: 6.7 10*3/uL (ref 4.0–10.5)

## 2017-12-04 LAB — URINALYSIS, ROUTINE W REFLEX MICROSCOPIC
Bilirubin Urine: NEGATIVE
GLUCOSE, UA: NEGATIVE mg/dL
Ketones, ur: NEGATIVE mg/dL
Nitrite: NEGATIVE
PH: 6 (ref 5.0–8.0)
Protein, ur: NEGATIVE mg/dL
SPECIFIC GRAVITY, URINE: 1.006 (ref 1.005–1.030)
WBC, UA: >50 WBC/hpf — ABNORMAL HIGH (ref 0–5)

## 2017-12-04 LAB — I-STAT TROPONIN, ED: TROPONIN I, POC: 0.02 ng/mL (ref 0.00–0.08)

## 2017-12-04 LAB — BASIC METABOLIC PANEL
Anion gap: 14 (ref 5–15)
BUN: 24 mg/dL — AB (ref 6–20)
CALCIUM: 8.7 mg/dL — AB (ref 8.9–10.3)
CHLORIDE: 96 mmol/L — AB (ref 101–111)
CO2: 25 mmol/L (ref 22–32)
CREATININE: 1.94 mg/dL — AB (ref 0.61–1.24)
GFR calc non Af Amer: 31 mL/min — ABNORMAL LOW (ref >60)
GFR, EST AFRICAN AMERICAN: 36 mL/min — AB (ref >60)
Glucose, Bld: 94 mg/dL (ref 65–99)
Potassium: 3.6 mmol/L (ref 3.5–5.1)
SODIUM: 135 mmol/L (ref 135–145)

## 2017-12-04 LAB — BRAIN NATRIURETIC PEPTIDE: B Natriuretic Peptide: 401.3 pg/mL — ABNORMAL HIGH (ref 0.0–100.0)

## 2017-12-04 LAB — D-DIMER, QUANTITATIVE: D-Dimer, Quant: 1.62 ug/mL-FEU — ABNORMAL HIGH (ref 0.00–0.50)

## 2017-12-04 MED ORDER — SODIUM CHLORIDE 0.9% FLUSH
3.0000 mL | INTRAVENOUS | Status: DC | PRN
  Administered 2017-12-04: 3 mL via INTRAVENOUS

## 2017-12-04 MED ORDER — VITAMIN B-1 100 MG PO TABS
100.0000 mg | ORAL_TABLET | Freq: Every day | ORAL | Status: DC
  Administered 2017-12-05 – 2017-12-07 (×3): 100 mg via ORAL
  Filled 2017-12-04 (×3): qty 1

## 2017-12-04 MED ORDER — ATORVASTATIN CALCIUM 40 MG PO TABS
40.0000 mg | ORAL_TABLET | Freq: Every day | ORAL | Status: DC
  Administered 2017-12-05 – 2017-12-06 (×2): 40 mg via ORAL
  Filled 2017-12-04 (×3): qty 1

## 2017-12-04 MED ORDER — FUROSEMIDE 20 MG PO TABS
40.0000 mg | ORAL_TABLET | Freq: Once | ORAL | Status: AC
  Administered 2017-12-04: 40 mg via ORAL
  Filled 2017-12-04: qty 2

## 2017-12-04 MED ORDER — DIPHENHYDRAMINE HCL 25 MG PO CAPS
25.0000 mg | ORAL_CAPSULE | Freq: Every evening | ORAL | Status: DC | PRN
  Administered 2017-12-04: 50 mg via ORAL
  Administered 2017-12-05: 25 mg via ORAL
  Administered 2017-12-06: 50 mg via ORAL
  Filled 2017-12-04: qty 1
  Filled 2017-12-04 (×2): qty 2

## 2017-12-04 MED ORDER — SODIUM CHLORIDE 0.9 % IV SOLN
250.0000 mL | INTRAVENOUS | Status: DC | PRN
Start: 2017-12-04 — End: 2017-12-07

## 2017-12-04 MED ORDER — APIXABAN 5 MG PO TABS
5.0000 mg | ORAL_TABLET | Freq: Two times a day (BID) | ORAL | Status: DC
  Administered 2017-12-05 – 2017-12-07 (×5): 5 mg via ORAL
  Filled 2017-12-04 (×7): qty 1

## 2017-12-04 MED ORDER — FAMOTIDINE 20 MG PO TABS
20.0000 mg | ORAL_TABLET | Freq: Every day | ORAL | Status: DC
  Administered 2017-12-05 – 2017-12-07 (×3): 20 mg via ORAL
  Filled 2017-12-04 (×3): qty 1

## 2017-12-04 MED ORDER — MIDODRINE HCL 5 MG PO TABS
2.5000 mg | ORAL_TABLET | Freq: Three times a day (TID) | ORAL | Status: DC
  Administered 2017-12-04 – 2017-12-05 (×2): 2.5 mg via ORAL
  Filled 2017-12-04 (×2): qty 1

## 2017-12-04 MED ORDER — ALBUTEROL SULFATE (2.5 MG/3ML) 0.083% IN NEBU
2.5000 mg | INHALATION_SOLUTION | RESPIRATORY_TRACT | Status: DC | PRN
  Administered 2017-12-06 (×2): 2.5 mg via RESPIRATORY_TRACT
  Filled 2017-12-04 (×2): qty 3

## 2017-12-04 MED ORDER — FUROSEMIDE 10 MG/ML IJ SOLN
40.0000 mg | Freq: Every day | INTRAMUSCULAR | Status: DC
  Administered 2017-12-04 – 2017-12-06 (×3): 40 mg via INTRAVENOUS
  Filled 2017-12-04 (×3): qty 4

## 2017-12-04 MED ORDER — VARENICLINE TARTRATE 0.5 MG PO TABS
0.5000 mg | ORAL_TABLET | Freq: Two times a day (BID) | ORAL | Status: DC
  Administered 2017-12-04 – 2017-12-07 (×6): 0.5 mg via ORAL
  Filled 2017-12-04 (×8): qty 1

## 2017-12-04 MED ORDER — ONDANSETRON HCL 4 MG/2ML IJ SOLN: 4.0000 mg | Freq: Four times a day (QID) | INTRAMUSCULAR | Status: DC | PRN

## 2017-12-04 MED ORDER — SODIUM CHLORIDE 0.9% FLUSH
3.0000 mL | Freq: Two times a day (BID) | INTRAVENOUS | Status: DC
  Administered 2017-12-05 – 2017-12-06 (×4): 3 mL via INTRAVENOUS

## 2017-12-04 MED ORDER — POLYETHYLENE GLYCOL 3350 17 G PO PACK: 17.0000 g | PACK | ORAL | Status: DC

## 2017-12-04 MED ORDER — IPRATROPIUM-ALBUTEROL 0.5-2.5 (3) MG/3ML IN SOLN
3.0000 mL | Freq: Four times a day (QID) | RESPIRATORY_TRACT | Status: DC
  Administered 2017-12-04 – 2017-12-05 (×5): 3 mL via RESPIRATORY_TRACT
  Filled 2017-12-04 (×4): qty 3

## 2017-12-04 MED ORDER — ENOXAPARIN SODIUM 100 MG/ML ~~LOC~~ SOLN
1.0000 mg/kg | Freq: Two times a day (BID) | SUBCUTANEOUS | Status: DC
  Filled 2017-12-04: qty 0.85

## 2017-12-04 MED ORDER — ASPIRIN EC 81 MG PO TBEC: 81.0000 mg | DELAYED_RELEASE_TABLET | Freq: Every day | ORAL | Status: DC

## 2017-12-04 MED ORDER — FERROUS SULFATE 325 (65 FE) MG PO TABS
325.0000 mg | ORAL_TABLET | Freq: Two times a day (BID) | ORAL | Status: DC
  Administered 2017-12-05 – 2017-12-07 (×5): 325 mg via ORAL
  Filled 2017-12-04 (×6): qty 1

## 2017-12-04 MED ORDER — FUROSEMIDE 10 MG/ML IJ SOLN
4.0000 mg/h | INTRAVENOUS | Status: DC
  Filled 2017-12-04: qty 25

## 2017-12-04 MED ORDER — ACETAMINOPHEN 325 MG PO TABS: 650.0000 mg | ORAL_TABLET | ORAL | Status: DC | PRN

## 2017-12-04 NOTE — ED Provider Notes (Addendum)
[facility] EMERGENCY DEPARTMENT Provider Note  CSN: [id number] Arrival date & time: 12/04/17  1101  History   Chief Complaint Chief Complaint  Patient presents with  . Shortness of Breath   HPI Kevin Watkins is a 77 y.o. male with a medical history of CHF, atrial fibrillation, CAD, PAD, HTN, COPD and CKD who presented to the ED for shortness of breath. Patient endorses SOB x1 with worsening fatigue and weakness over the last couple of weeks. He states that he is on 2L [place] at home, but has had multiple instances where he has had to increase it to 3L because he felt more out of breath especially with exertion and states his O2 sats decrease to 85-88% with ambulation sometimes. Patient is concerned about his BP and has been hypotensive at 70-90/45-60 according to home BP logs.  Associated symptoms: cough without sputum, leg swelling, decreased appetite, dizziness. Denies fever, chest pain and palpitations. Denies hematemesis, hematochezia/melena. Patient has spoken to his PCP prior to coming to the ED and reports being compliant with all medications since his discharge 10/2017.  Additional history obtained by medical chart. Patient admitted at [facility] from 10/28/17-11/03/17 for HF exacerbation. During that time, he underwent thorough evaluation for HF, blood loss and respiratory failure. He received 1 unit of pRBC for blood loss during this admission.   Past Medical History:  Diagnosis Date  . CAD in native artery 06/2000   BMS to the RCA; known 60% mid dominant RCA stenosis and 50-60% OM 2 branch stenosis normal LV function. Last Myoview May 2011 negative for ischemia.  . CHF (congestive heart failure) ([place])   . COPD (chronic obstructive pulmonary disease) ([place])   . Dyslipidemia, goal LDL below 70   . Dyspnea   . Edema   . EtOH dependence ([place])    Family reported  . Hypertension   . Medical history non-contributory   . Oxygen deficiency   . PAD (peripheral artery disease) (HCC)     iliac, SCA, Innominate, and renal PTA  . Tobacco abuse     Patient Active Problem List   Diagnosis Date Noted  . Benign neoplasm of transverse colon   . Persistent atrial fibrillation ([place])   . Heme positive stool   . 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 Pressure injury of skin 10/29/2017  . Normocytic anemia 10/28/2017  . CKD (chronic kidney disease), stage III ([place]) 10/28/2017  . Iron deficiency anemia 10/28/2017  . CAP (community acquired pneumonia) 08/01/2017  . Acute on chronic respiratory failure with hypoxia ([place]) 01/17/2017  . Pulmonary edema   . Acute respiratory failure with hypoxia and hypercarbia ([place]) 12/19/2016  . COPD (chronic obstructive pulmonary disease) ([place])   . Dyspnea 12/17/2016  . Chronic respiratory failure with hypoxia ([place]) 06/23/2016  . Obesity 06/23/2016  . Chronic anticoagulation 06/23/2016  . Paroxysmal atrial fibrillation ([place]) 05/30/2016  . Bilateral carotid artery disease ([place]) 10/30/2015  . Epistaxis 04/30/2015  . Chronic diastolic heart failure ([place]) 06/03/2014  . Acute on chronic diastolic CHF (congestive heart failure) ([place]) 10/30/2013  . Bilateral leg edema 02/15/2013  . CAD-RCA BMS 2001, low risk Myoview 2011   . PAD- multiple interventions- renal, iliac, SCA   . Tobacco abuse   . Dyslipidemia, goal LDL below 70   . Essential hypertension     Past Surgical History:  Procedure Laterality Date  . ANGIOPLASTY  '01, '03   innominate artery PTA  . Carotid Doppler  2013   R  vertebral appears occluded, R subclavian with prox occlusion v. high grade stenosis; R bulb 70-99% diameter reduction; R prox ICA 50-69% diameter reduction; L bulb & prox ICA 50-69% diameter reduction; L subclavian with >50% diameter reduction  . COLONOSCOPY WITH PROPOFOL N/A 11/02/2017   Procedure: COLONOSCOPY WITH PROPOFOL;  Surgeon: Jerene Bears, MD;  Location: [facility];  Service: Gastroenterology;  Laterality: N/A;  . CORONARY ANGIOPLASTY WITH STENT PLACEMENT   06/26/2000    BMS to RCA; residual 60%mid dominant RCA stent, 50-60% OM2 stenosis (Dr. Adora Fridge)  . ESOPHAGOGASTRODUODENOSCOPY N/A 11/02/2017   Procedure: ESOPHAGOGASTRODUODENOSCOPY (EGD);  Surgeon: Jerene Bears, MD;  Location: [facility] ENDOSCOPY;  Service: Gastroenterology;  Laterality: N/A;  . NM MYOCAR PERF WALL MOTION  2011   persantine myoview - normal perfusion in all regions, EF 64%  . RENAL ANGIOGRAM  09/22/2005   stent to right renal artery with 5x12 Genesis on Aviator stent balloon pre-mount (Dr. Adora Fridge)  . Renal Doppler  2013   R prox renal artery stent with 60-99% in-stent restenosis; L prox renal artery =/> 60% diameter reduction; R & L kidneys normal in size  . SUBCLAVIAN ANGIOGRAM  07/15/2004   stent to right subclavian (7x18 Genesis) and innominate (8x24 Genesis), with known ostial left common carotid stenosis and left subclavian stenosis as well (Dr. Adora Fridge)  . TRANSTHORACIC ECHOCARDIOGRAM  2013   EF=>55%, mild conc LVH; LA mod dilated; RA mild-mod dilated; mild mitral annular calcif, mild MR; mild TR with normal RSVP  . Upper Extremity Arterial Doppler  2013   R prox subclavian artery stent with prox occlusion v. high grade stenosis; R vertebral appears occluded; L subclavian with >60% diameter reduction        Home Medications    Prior to Admission medications   Medication Sig Start Date End Date Taking? Authorizing Provider  apixaban (ELIQUIS) 5 MG TABS tablet Take 1 tablet (5 mg total) by mouth 2 (two) times daily. 11/06/17  Yes Lorretta Harp, MD  aspirin EC 81 MG tablet Take 81 mg by mouth daily.   Yes [provider]  atorvastatin (LIPITOR) 40 MG tablet Take 40 mg by mouth daily at 6 PM.  12/26/12  Yes [provider]  B Complex-Biotin-FA (VITAMIN B50 COMPLEX PO) Take 1 tablet by mouth daily.   Yes [provider]  diphenhydrAMINE (BENADRYL) 25 MG tablet Take 25-50 mg by mouth See admin instructions. Take two capsules (50 mg) by mouth daily  at bedtime, may also take one or two capsule (25-50 mg) during the night as needed for sleep   Yes [provider]  ferrous sulfate 325 (65 FE) MG tablet Take 1 tablet (325 mg total) by mouth 2 (two) times daily with a meal. 10/25/17  Yes Sherwood Gambler, MD  fludrocortisone (FLORINEF) 0.1 MG tablet Take 1 tablet (0.1 mg total) by mouth daily. 11/21/17  Yes Lorretta Harp, MD  folic acid (FOLVITE) 034 MCG tablet Take 800 mcg by mouth daily.   Yes [provider]  furosemide (LASIX) 40 MG tablet Take 1 tablet (40 mg total) by mouth daily. 11/04/17  Yes Dessa Phi, DO  Garlic 7425 MG CAPS Take 1,000 mg by mouth daily.    Yes [provider]  guaiFENesin-dextromethorphan (ROBITUSSIN DM) 100-10 MG/5ML syrup Take 5 mLs by mouth every 4 (four) hours as needed for cough. 12/24/16  Yes Alphonzo Grieve, MD  ipratropium-albuterol (DUONEB) 0.5-2.5 (3) MG/3ML SOLN Take 3 mLs by nebulization every 4 (four)  hours as needed (shortness of breath).    Yes [provider]  Lactobacillus (PROBIOTIC ACIDOPHILUS PO) Take 1 tablet by mouth daily.   Yes [provider]  metoprolol tartrate (LOPRESSOR) 25 MG tablet Take 1 tablet (25 mg total) by mouth 2 (two) times daily. Patient taking differently: Take 12.5 mg by mouth 2 (two) times daily.  11/03/17  Yes Dessa Phi, DO  Omega-3 Fatty Acids (FISH OIL) 1200 MG CAPS Take 1,200 mg by mouth 2 (two) times daily.   Yes [provider]  OVER THE COUNTER MEDICATION Place 2 drops under the tongue 2 (two) times daily. CBD tincture/hemp flower extract   Yes [provider]  OXYGEN Inhale 2 L into the lungs continuous.   Yes [provider]  polyethylene glycol (MIRALAX / GLYCOLAX) packet Take by mouth See admin instructions. Mix 3 teaspoonsful (15 mls) in water and drink every evening   Yes [provider]  PROAIR HFA 108 (90 Base) MCG/ACT inhaler Inhale 2 puffs into the lungs every 6 (six) hours as  needed for wheezing or shortness of breath. 12/24/16  Yes Alphonzo Grieve, MD  ranitidine (ZANTAC) 75 MG tablet Take 75 mg by mouth daily.   Yes [provider]  thiamine 100 MG tablet Take 1 tablet (100 mg total) by mouth daily. 12/28/16  Yes Lorretta Harp, MD  varenicline (CHANTIX PAK) 0.5 MG X 11 & 1 MG X 42 tablet Take 0.5 mg by mouth 2 (two) times daily.    Yes [provider]  vitamin E (VITAMIN E) 400 UNIT capsule Take 400 Units by mouth daily.   Yes [provider]  Wheat Dextrin (BENEFIBER PO) Take 20 mLs by mouth daily. Mix 4 teaspoon (20 ml) in water and drink daily   Yes [provider]  diltiazem (CARDIZEM) 30 MG tablet Take 1 tablet (30 mg total) by mouth every 8 (eight) hours. Patient not taking: Reported on 12/04/2017 11/03/17   Dessa Phi, DO  potassium chloride SA (K-DUR,KLOR-CON) 20 MEQ tablet Take 1 tablet (20 mEq total) by mouth daily. Patient not taking: Reported on 12/04/2017 10/25/17   Sherwood Gambler, MD    Family History Family History  Problem Relation Age of Onset  . Heart disease Mother        hx CABG  . Heart disease Father        hx of CABG  . Cancer Sister   . Heart attack Brother   . Heart disease Brother     Social History Social History   Tobacco Use  . Smoking status: Former Smoker    Packs/day: 1.00    Years: 65.00    Pack years: 65.00    Types: Cigarettes    Last attempt to quit: 08/11/2016    Years since quitting: 1.3  . Smokeless tobacco: Never Used  . Tobacco comment: pt states he has quit x 3 weeks   Substance Use Topics  . Alcohol use: Yes    Alcohol/week: 12.6 oz    Types: 21 Cans of beer per week    Comment: 1 beer every 2 weeks.   . Drug use: No     Allergies   Ambien [zolpidem tartrate]   Review of Systems Review of Systems  Constitutional: Positive for activity change, appetite change and fatigue. Negative for chills and fever.  HENT: Negative.   Respiratory: Positive for cough  and shortness of breath. Negative for choking, chest tightness and wheezing.   Cardiovascular: Positive for  leg swelling. Negative for chest pain and palpitations.  Gastrointestinal: Negative for abdominal pain, constipation, diarrhea, nausea and vomiting.  Endocrine: Negative.   Genitourinary: Negative.   Skin: Negative.   Neurological: Positive for dizziness, weakness and light-headedness. Negative for headaches.     Physical Exam Updated Vital Signs BP 93/67   Pulse 80   Temp 97.8 F (36.6 C) (Oral)   Resp 20   SpO2 96%   Physical Exam  Constitutional: He appears well-developed and well-nourished.  HENT:  Mouth/Throat: Uvula is midline, oropharynx is clear and moist and mucous membranes are normal.  Eyes: Pupils are equal, round, and reactive to light. Conjunctivae, EOM and lids are normal.  Neck: No JVD present. Carotid bruit is not present.  Cardiovascular: Intact distal pulses. An irregularly irregular rhythm present. PMI is not displaced. Exam reveals no friction rub and no decreased pulses.  No murmur heard. Pulmonary/Chest: Effort normal and breath sounds normal. No accessory muscle usage. No tachypnea. No respiratory distress. He has no wheezes. He has no rhonchi. He has no rales.  On 2L [place]  Skin: Skin is warm, dry and intact. He is not diaphoretic.  Nursing note and vitals reviewed.    ED Treatments / Results  Labs (all labs ordered are listed, but only abnormal results are displayed) Labs Reviewed  BASIC METABOLIC PANEL - Abnormal; Notable for the following components:      Result Value   Chloride 96 (*)    BUN 24 (*)    Creatinine, Ser 1.94 (*)    Calcium 8.7 (*)    GFR calc non Af Amer 31 (*)    GFR calc Af Amer 36 (*)    All other components within normal limits  CBC - Abnormal; Notable for the following components:   RBC 2.66 (*)    Hemoglobin 7.8 (*)    HCT 24.7 (*)    RDW 20.0 (*)    All other components within normal limits  D-DIMER,  QUANTITATIVE (NOT AT [facility]) - Abnormal; Notable for the following components:   D-Dimer, Quant 1.62 (*)    All other components within normal limits  URINALYSIS, ROUTINE W REFLEX MICROSCOPIC - Abnormal; Notable for the following components:   APPearance HAZY (*)    Hgb urine dipstick SMALL (*)    Leukocytes, UA MODERATE (*)    WBC, UA >50 (*)    Bacteria, UA RARE (*)    All other components within normal limits  BRAIN NATRIURETIC PEPTIDE - Abnormal; Notable for the following components:   B Natriuretic Peptide 401.3 (*)    All other components within normal limits  I-STAT TROPONIN, ED    EKG EKG Interpretation  Date/Time:  Monday Dec 04 2017 11:11:43 EDT Ventricular Rate:  92 PR Interval:    QRS Duration: 84 QT Interval:  394 QTC Calculation: 487 R Axis:   65 Text Interpretation:  Atrial fibrillation Minimal voltage criteria for LVH, may be normal variant Nonspecific ST and T wave abnormality Prolonged QT Abnormal ECG since last tracing no significant change Confirmed by Noemi Chapel [phone]) on 12/04/2017 1:19:07 PM   Radiology Dg Chest 2 View  Result Date: 12/04/2017 CLINICAL DATA:  Short of breath.  Cough. EXAM: CHEST - 2 VIEW COMPARISON:  10/28/2017 FINDINGS: Mild cardiomegaly. Vascular congestion. Diffuse interstitial infiltrates. Airspace edema towards the lung bases. Small pleural effusions. No pneumothorax. IMPRESSION: CHF with pulmonary edema and small pleural effusions. Electronically Signed   By: Marybelle Killings M.D.   On: 12/04/2017 11:49  Procedures Procedures (including critical care time)  Medications Ordered in ED Medications  furosemide (LASIX) tablet 40 mg (40 mg Oral Given 12/04/17 1403)     Initial Impression / Assessment and Plan / ED Course  Triage vital signs and the nursing notes have been reviewed.  Pertinent labs & imaging results that were available during care of the patient were reviewed and considered in medical decision making (see chart for  details).  Clinical Course as of Dec 05 1414  Mon Dec 04, 2017  1245 Last labs drawn during hospitalization in 10/2017. Hgb 7.8 (decreased from 8.4). Cr 1.94 (elevated from baseline ~1.40).   [GM]  3295 CXR showed bilateral pleural effusions and pulmonary edema consistent with CHF. His CXR in 10/2017 prior to admission was similar. Lasix 40mg  x1 given.   [GM]  1343 EKG shows a-fib. No ST elevations or acute ischemia or infarct.   [GM]  1344 D-dimer elevated at 1.62 which could be indicative of PE. VQ scan ordered due to pt's elevated creatinine.   [GM]    Clinical Course User Index [GM] Ison Wichmann, Jonelle Sports, PA-C   Patient is on 2L [place] in the ED. He is symptomatic endorsing weakness and SOB in the ED, but does not appear in distress. Pt's history and presentation is consistent with acute HF and supported by labs and imaging. Lasix 40mg  x1 given in the ED.  Also probable co-occurring pulmonary embolism. Case discussed with Dr. Noemi Chapel. Case also discussed with internal medicine provider for admission. Due to patient's current clinical presentation and medical history, he requires inpatient hospitalization for further evaluation and treatment of his HF exacerbation, hypotension, anemia and possible PE.   Final Clinical Impressions(s) / ED Diagnoses  1. CHF Exacerbation. Lasix 40mg  x1 given in the ED. Admit to internal medicine. 2. Elevated D-Dimer. Ordered VQ scan to evaluate for PE given pt's elevated creatinine. Will defer anticoagulation treatment to internal medicine. 3. Anemia. Hemoglobin 7.8 which is decreased from last hemoglobin lab in 10/2017. 4. Hypotension. At home ~18-84 systolic.  Dispo: Admit.   Final diagnoses:  Acute on chronic diastolic heart failure [facility])    ED Discharge Orders    None        Romie Jumper, PA-C 12/04/17 1409    [address], PA-C 12/04/17 1417    Gal[place] Logie, [place] I, PA-C 12/04/17 1445    Regine Christian, [place] I,  PA-C 12/04/17 1451    Noemi Chapel, MD 12/05/17 1120

## 2017-12-04 NOTE — ED Triage Notes (Signed)
Pt presents for evaluation of SOB and worsening leg edema. Hx of CHF.2L [place] at home chronic.

## 2017-12-04 NOTE — ED Notes (Signed)
Patient transported to X-ray 

## 2017-12-04 NOTE — Consult Note (Signed)
Cardiology Consultation:   Patient ID: Kevin Watkins; [id number]; [birth year]   Admit date: 12/04/2017 Date of Consult: 12/04/2017  Primary Care Provider: Ronita Hipps, MD Primary Cardiologist: Quay Burow, MD    Patient Profile:   Kevin Watkins is a 77 y.o. male with a hx of coronary artery disease, chronic diastolic congestive heart failure, COPD on home O2, hypertension, peripheral vascular disease, permanent atrial fibrillation, tobacco abuse who is being seen today for the evaluation of acute on chronic diastolic congestive heart failure at the request of Karmen Bongo MD.  History of Present Illness:   Patient has had previous PCI of his right coronary artery.  Nuclear study 5/11 showed EF 64 and normal perfusion. Last echocardiogram April 2019 showed normal LV systolic function, moderate left ventricular hypertrophy and mild to moderate left atrial enlargement.  Patient discharged in April following admission for acute on chronic diastolic congestive heart failure and GI bleed.  EGD and colonoscopy revealed no source of bleeding.  Patient is on home oxygen and has chronic dyspnea on exertion.  Over the past 4 weeks he has noticed increased dyspnea on exertion.  He also has developed increased pedal edema but denies increased weight gain.  He has chronic orthopnea.  He denies chest pain, fevers or chills.  He has a chronic mild productive cough.  No palpitations, syncope, melena or hematochezia.  Because of his dyspnea he was admitted and cardiology asked to evaluate.  Note Florinef was started 2 weeks ago by Dr. Gwenlyn Found for chronic hypotension.  Past Medical History:  Diagnosis Date  . CAD in native artery 06/2000   BMS to the RCA; known 60% mid dominant RCA stenosis and 50-60% OM 2 branch stenosis normal LV function. Last Myoview May 2011 negative for ischemia.  . CHF (congestive heart failure) ([place])   . COPD (chronic obstructive pulmonary disease) ([place])   . Dyslipidemia, goal LDL below  70   . EtOH dependence ([place])    Family reported  . Hypertension   . Oxygen deficiency   . PAD (peripheral artery disease) ([facility])    iliac, SCA, Innominate, and renal PTA  . Tobacco abuse     Past Surgical History:  Procedure Laterality Date  . ANGIOPLASTY  '01, '03   innominate artery PTA  . Carotid Doppler  2013   R vertebral appears occluded, R subclavian with prox occlusion v. high grade stenosis; R bulb 70-99% diameter reduction; R prox ICA 50-69% diameter reduction; L bulb & prox ICA 50-69% diameter reduction; L subclavian with >50% diameter reduction  . COLONOSCOPY WITH PROPOFOL N/A 11/02/2017   Procedure: COLONOSCOPY WITH PROPOFOL;  Surgeon: Jerene Bears, MD;  Location: [facility];  Service: Gastroenterology;  Laterality: N/A;  . CORONARY ANGIOPLASTY WITH STENT PLACEMENT  06/26/2000    BMS to RCA; residual 60%mid dominant RCA stent, 50-60% OM2 stenosis (Dr. Adora Fridge)  . ESOPHAGOGASTRODUODENOSCOPY N/A 11/02/2017   Procedure: ESOPHAGOGASTRODUODENOSCOPY (EGD);  Surgeon: Jerene Bears, MD;  Location: [facility];  Service: Gastroenterology;  Laterality: N/A;  . NM MYOCAR PERF WALL MOTION  2011   persantine myoview - normal perfusion in all regions, EF 64%  . RENAL ANGIOGRAM  09/22/2005   stent to right renal artery with 5x12 Genesis on Aviator stent balloon pre-mount (Dr. Adora Fridge)  . Renal Doppler  2013   R prox renal artery stent with 60-99% in-stent restenosis; L prox renal artery =/> 60% diameter reduction; R & L kidneys normal in size  . SUBCLAVIAN ANGIOGRAM  07/15/2004   stent to right subclavian (7x18 Genesis) and innominate (8x24 Genesis), with known ostial left common carotid stenosis and left subclavian stenosis as well (Dr. Adora Fridge)  . TRANSTHORACIC ECHOCARDIOGRAM  2013   EF=>55%, mild conc LVH; LA mod dilated; RA mild-mod dilated; mild mitral annular calcif, mild MR; mild TR with normal RSVP  . Upper Extremity Arterial Doppler  2013   R prox subclavian artery stent with  prox occlusion v. high grade stenosis; R vertebral appears occluded; L subclavian with >60% diameter reduction       Inpatient Medications: Scheduled Meds: . [START ON 12/05/2017] aspirin EC  81 mg Oral Daily  . atorvastatin  40 mg Oral q1800  . diphenhydrAMINE  25-50 mg Oral See admin instructions  . enoxaparin (LOVENOX) injection  1 mg/kg Subcutaneous Q12H  . [START ON 12/05/2017] famotidine  20 mg Oral Daily  . ferrous sulfate  325 mg Oral BID WC  . ipratropium-albuterol  3 mL Nebulization Q6H  . polyethylene glycol  17 g Oral See admin instructions  . sodium chloride flush  3 mL Intravenous Q12H  . [START ON 12/05/2017] thiamine  100 mg Oral Daily  . varenicline  1 tablet Oral BID   Continuous Infusions: . sodium chloride    . furosemide (LASIX) infusion     PRN Meds: sodium chloride, acetaminophen, albuterol, ondansetron (ZOFRAN) IV, sodium chloride flush  Allergies:    Allergies  Allergen Reactions  . Ambien [Zolpidem Tartrate] Other (See Comments)    confusion    Social History:   Social History   Socioeconomic History  . Marital status: Married    Spouse name: Not on file  . Number of children: Not on file  . Years of education: Not on file  . Highest education level: Not on file  Occupational History  . Not on file  Social Needs  . Financial resource strain: Not on file  . Food insecurity:    Worry: Not on file    Inability: Not on file  . Transportation needs:    Medical: Not on file    Non-medical: Not on file  Tobacco Use  . Smoking status: Former Smoker    Packs/day: 2.00    Years: 65.00    Pack years: 130.00    Types: Cigarettes    Last attempt to quit: 08/11/2016    Years since quitting: 1.3  . Smokeless tobacco: Never Used  Substance and Sexual Activity  . Alcohol use: Not Currently    Comment: 1 beer every 2 weeks; h/o heavy drinking  . Drug use: No  . Sexual activity: Not on file  Lifestyle  . Physical activity:    Days per week: Not on  file    Minutes per session: Not on file  . Stress: Not on file  Relationships  . Social connections:    Talks on phone: Not on file    Gets together: Not on file    Attends religious service: Not on file    Active member of club or organization: Not on file    Attends meetings of clubs or organizations: Not on file    Relationship status: Not on file  . Intimate partner violence:    Fear of current or ex partner: Not on file    Emotionally abused: Not on file    Physically abused: Not on file    Forced sexual activity: Not on file  Other Topics Concern  . Not on file  Social History  Narrative  . Not on file    Family History:    Family History  Problem Relation Age of Onset  . Heart disease Mother        hx CABG  . Heart disease Father        hx of CABG  . Cancer Sister   . Heart attack Brother   . Heart disease Brother      ROS:  Please see the history of present illness.  Chronic mild productive cough, fatigue.  No melena or hematochezia. All other ROS reviewed and negative.     Physical Exam/Data:   Vitals:   12/04/17 1430 12/04/17 1545 12/04/17 1608 12/04/17 1615  BP: (!) 96/57 111/71  96/78  Pulse: 80 99  87  Resp: 20 (!) 23  (!) 22  Temp:      TempSrc:      SpO2: 96% 100% 100% 100%    Intake/Output Summary (Last 24 hours) at 12/04/2017 1651 Last data filed at 12/04/2017 1607 Gross per 24 hour  Intake -  Output 350 ml  Net -350 ml   There were no vitals filed for this visit. There is no height or weight on file to calculate BMI.  General:  Well nourished, well developed, chronically ill-appearing in no acute distress HEENT: normal Neck: Jugular venous pulsation at 10 cm Vascular: Bilateral carotid bruits; FA pulses 2+ bilaterally Cardiac: Irregular, 2/6 systolic murmur left sternal border. Lungs: Basilar crackles Abd: soft, nontender, no hepatomegaly  Ext: 1+ edema Musculoskeletal:  No deformities, BUE and BLE strength normal and equal Skin:  warm and dry  Neuro:  CNs 2-12 intact, no focal abnormalities noted Psych:  Normal affect   EKG:  The EKG was personally reviewed and demonstrates: Atrial fibrillation with nonspecific ST changes.  Laboratory Data:  Chemistry Recent Labs  Lab 12/04/17 1108  NA 135  K 3.6  CL 96*  CO2 25  GLUCOSE 94  BUN 24*  CREATININE 1.94*  CALCIUM 8.7*  GFRNONAA 31*  GFRAA 36*  ANIONGAP 14    Hematology Recent Labs  Lab 12/04/17 1108  WBC 6.7  RBC 2.66*  HGB 7.8*  HCT 24.7*  MCV 92.9  MCH 29.3  MCHC 31.6  RDW 20.0*  PLT 180    Recent Labs  Lab 12/04/17 1125  TROPIPOC 0.02    BNP Recent Labs  Lab 12/04/17 1335  BNP 401.3*    DDimer  Recent Labs  Lab 12/04/17 1108  DDIMER 1.62*    Radiology/Studies:  Dg Chest 2 View  Result Date: 12/04/2017 CLINICAL DATA:  Short of breath.  Cough. EXAM: CHEST - 2 VIEW COMPARISON:  10/28/2017 FINDINGS: Mild cardiomegaly. Vascular congestion. Diffuse interstitial infiltrates. Airspace edema towards the lung bases. Small pleural effusions. No pneumothorax. IMPRESSION: CHF with pulmonary edema and small pleural effusions. Electronically Signed   By: Marybelle Killings M.D.   On: 12/04/2017 11:49    Assessment and Plan:   1. Acute on chronic diastolic congestive heart failure-patient is somewhat volume overloaded on examination.  He is a very complicated patient as he has problems with hypotension as well that limits the ability to diurese.  He was recently placed on Florinef which would likely exacerbate his diastolic congestive heart failure.  I will discontinue.  Instead we will add midodrine 2.5 mg p.o. 3 times daily and increase as tolerated.  Hopefully this will help his pressure.  I will discontinue Lasix drip and instead treat with 40 mg of IV Lasix daily.  Follow strict I/Os; follow renal function.  We discussed the importance of fluid restriction and low-sodium diet.  Patient had a recent echocardiogram showing normal LV systolic  function. 2. Chronic hypotension/orthostasis-as outlined above we will discontinue Florinef because of potentially exacerbating CHF.  Add Midodrine 2.5 mg 3 times daily and increase as needed.  We will hold metoprolol but may need to resume low-dose for his atrial fibrillation rate control.  Follow blood pressure and adjust regimen as needed. 3. Permanent atrial fibrillation-as outlined metoprolol will be held but will need to follow heart rate and resume lower dose as needed. Amiodarone would be a consideration for rate control as it does not have blood pressure lowering effects.  However he has home O2 dependent COPD and I would like to avoid.  We could also add renal dose digoxin if needed. CHADSvasc 5.  Discontinue Lovenox and instead treat with apixaban 5 mg twice daily.   4. Chronic stage III kidney disease-follow renal function closely with diuresis. 5. Anemia-patient complains of significant fatigue and anemia is likely contributing.  He had previous EGD and colonoscopy that were unrevealing.  Continue iron.  We will continue apixaban to decrease risk of embolic event with atrial fibrillation.  He has not had a peripheral or cardiac intervention in 10 years.  Discontinue aspirin to hopefully reduce risk of blood loss.  Further anemia evaluation per primary care.  6. COPD-per primary care.   For questions or updates, please contact [place] Please consult [email or web address] for contact info under Cardiology/STEMI.   Signed, Kirk Ruths, MD  12/04/2017 4:51 PM

## 2017-12-04 NOTE — Progress Notes (Signed)
ANTICOAGULATION CONSULT NOTE - Initial Consult  Pharmacy Consult for enoxaparin Indication: atrial fibrillation  Allergies  Allergen Reactions  . Ambien [Zolpidem Tartrate] Other (See Comments)    confusion    Patient Measurements:    Vital Signs: Temp: 97.8 F (36.6 C) (05/27 1106) Temp Source: Oral (05/27 1106) BP: 96/57 (05/27 1430) Pulse Rate: 80 (05/27 1430)  Labs: Recent Labs    12/04/17 1108  HGB 7.8*  HCT 24.7*  PLT 180  CREATININE 1.94*    Estimated Creatinine Clearance: 32.8 mL/min (A) (by C-G formula based on SCr of 1.94 mg/dL (H)).  Assessment: CC/HPI: 77 yo m presenting with SOB  PMH: CHF, CAD, afib, PAD, HTN, COPD, CKD  Anticoag: apixaban pta for hx afib - elevated d-dimer on admit Last dose apixaban 5/27 0600  Renal: SCr 1.94  Heme/Onc: H&H 7.8/24.7, Plt 180  Plan:  F/U VQ scan results Enox 1 mg/kg q12h 1st dose 1800 (borderline renal fx, adjust tues if worsens) Monitor renal fx, return to apixaban   Levester Fresh, PharmD, BCPS, BCCCP Clinical Pharmacist Clinical phone for 12/04/2017 from 1430 - 2300: [phone] If after 2300, please call main pharmacy at: [phone] 12/04/2017 3:49 PM

## 2017-12-04 NOTE — ED Provider Notes (Addendum)
Medical screening examination/treatment/procedure(s) were conducted as a shared visit with non-physician practitioner(s) and myself.  I personally evaluated the patient during the encounter.  Clinical Impression:   Final diagnoses:  Acute on chronic diastolic heart failure [facility])   The patient is a 77 year old male with a known history of congestive heart failure, has had a recent admission to the hospital for some diuresis during which time he had an echocardiogram showing a reasonably preserved ejection fraction, systolic function was normal but the diastolic function was difficult to assess.  Traditionally he has had known diastolic dysfunction.  He presents with increasing shortness of breath, increasing peripheral edema, increasing orthopnea and generalized weakness.  He also has with him his list of blood pressures which have been low some measuring as low as in the 70 systolic range from home.  He states he has had a fairly profound weakness with relation to this hypotension as well.  He denies fevers, dysuria, hematuria, has no significant increase in coughing or chest pain.  Review of Systems  All other systems reviewed and are negative. except for postiive for swelling, shortness of breath and hypotension   On exam the patient is edematous, bilateral lower extremities with 2+ pitting edema which is symmetrical.  His lungs have subtle rales, he has not in any respiratory distress, speaks in full sentences, he does have mild tachypnea, he is on oxygen with reasonable and acceptable oxygen range on supplemental O2.  His work-up today shows that he does have increasing pulmonary edema, I suspect that this is the cause of his shortness of breath, he will need to be readmitted to the hospital given his hypotension associated with that.  The etiology of the hypotension is not clear.   EKG Interpretation  Date/Time:  Tuesday Dec 05 2017 05:32:23 EDT Ventricular Rate:  100 PR Interval:    QRS  Duration: 88 QT Interval:  365 QTC Calculation: 471 R Axis:   71 Text Interpretation:  Atrial fibrillation Repol abnrm, severe global ischemia (LM/MVD) When compared with ECG of 12/04/2017, No significant change was found Confirmed by Delora Fuel ([id number]) on 12/05/2017 5:37:49 AM Also confirmed by Delora Fuel ([id number]), editor Hattie Perch ([id number])  on 12/05/2017 6:53:24 AM            Noemi Chapel, MD 12/05/17 1112    Noemi Chapel, MD 12/05/17 1115

## 2017-12-04 NOTE — H&P (Signed)
History and Physical    ANGELLO Watkins KXF:[id number] DOB: May 23, 1941 DOA: 12/04/2017  PCP: Ronita Hipps, MD Consultants:  Gwenlyn Found - cardiology; [place] - pulmonology; Melina Copa - GI Patient coming from:  Home - lives with wife; NOK:  Wife, [phone]  Chief Complaint: SOB  HPI: Kevin Watkins is a 77 y.o. male with medical history significant of CHF (preserved EF, uncertain diastolic function on 1/75); afib on Eliquis; CAD; PAD; HTN; COPD; and stage 3 CKD who presents with SOB.  "I didn't feel good.  Pretty much, shortness of breath.  It was simliar to congestive heart failure."  He was discharged about a month ago.  He hasn't felt good in about 4 months because his BP has been so low - 60-80 with a 90 once in a while.  He doesn't "feel like doing anything, I mean I'm just give out".  Also with little appetite.  Breakfast is his biggest meal, but the last 1-2 months he doesn't have the appetite even for breakfast.  SOB is only with exertion.  +orthopnea.  He really hasn't been able to get up at all for the last few months.  +PND.  He does wear 2L home O2 due to COPD.  He was hospitalized from 4/20-26/19 for acute on chronic respiratory failure with hypoxia from CHF and worsening anemia.  No fever.  He did get a unit of blood during last hospitalization and had EGD (negative)/colonoscopy (10 mm polyp s/p polypectomy).  ED Course:   Heart failure exacerbation - worsening symptoms with SOB, orthopnea, swelling.  Recently admitted for the same.  Also with hypotension, SBP 70-90 daily.  D-dimer positive, pending VQ scan, probable PE.  Gave a dose of Lasix.  Review of Systems: As per HPI; otherwise review of systems reviewed and negative.   Ambulatory Status:  Ambulates with a rolling walker  Past Medical History:  Diagnosis Date  . CAD in native artery 06/2000   BMS to the RCA; known 60% mid dominant RCA stenosis and 50-60% OM 2 branch stenosis normal LV function. Last Myoview May 2011 negative for  ischemia.  . CHF (congestive heart failure) ([place])   . COPD (chronic obstructive pulmonary disease) ([place])   . Dyslipidemia, goal LDL below 70   . Dyspnea   . Edema   . EtOH dependence ([place])    Family reported  . Hypertension   . Medical history non-contributory   . Oxygen deficiency   . PAD (peripheral artery disease) (HCC)    iliac, SCA, Innominate, and renal PTA  . Tobacco abuse     Past Surgical History:  Procedure Laterality Date  . ANGIOPLASTY  '01, '03   innominate artery PTA  . Carotid Doppler  2013   R vertebral appears occluded, R subclavian with prox occlusion v. high grade stenosis; R bulb 70-99% diameter reduction; R prox ICA 50-69% diameter reduction; L bulb & prox ICA 50-69% diameter reduction; L subclavian with >50% diameter reduction  . COLONOSCOPY WITH PROPOFOL N/A 11/02/2017   Procedure: COLONOSCOPY WITH PROPOFOL;  Surgeon: Jerene Bears, MD;  Location: [facility];  Service: Gastroenterology;  Laterality: N/A;  . CORONARY ANGIOPLASTY WITH STENT PLACEMENT  06/26/2000    BMS to RCA; residual 60%mid dominant RCA stent, 50-60% OM2 stenosis (Dr. Adora Fridge)  . ESOPHAGOGASTRODUODENOSCOPY N/A 11/02/2017   Procedure: ESOPHAGOGASTRODUODENOSCOPY (EGD);  Surgeon: Jerene Bears, MD;  Location: [facility] ENDOSCOPY;  Service: Gastroenterology;  Laterality: N/A;  . [place] WALL MOTION  2011  persantine myoview - normal perfusion in all regions, EF 64%  . RENAL ANGIOGRAM  09/22/2005   stent to right renal artery with 5x12 Genesis on Aviator stent balloon pre-mount (Dr. Adora Fridge)  . Renal Doppler  2013   R prox renal artery stent with 60-99% in-stent restenosis; L prox renal artery =/> 60% diameter reduction; R & L kidneys normal in size  . SUBCLAVIAN ANGIOGRAM  07/15/2004   stent to right subclavian (7x18 Genesis) and innominate (8x24 Genesis), with known ostial left common carotid stenosis and left subclavian stenosis as well (Dr. Adora Fridge)  . TRANSTHORACIC ECHOCARDIOGRAM  2013    EF=>55%, mild conc LVH; LA mod dilated; RA mild-mod dilated; mild mitral annular calcif, mild MR; mild TR with normal RSVP  . Upper Extremity Arterial Doppler  2013   R prox subclavian artery stent with prox occlusion v. high grade stenosis; R vertebral appears occluded; L subclavian with >60% diameter reduction    Social History   Socioeconomic History  . Marital status: Married    Spouse name: Not on file  . Number of children: Not on file  . Years of education: Not on file  . Highest education level: Not on file  Occupational History  . Not on file  Social Needs  . Financial resource strain: Not on file  . Food insecurity:    Worry: Not on file    Inability: Not on file  . Transportation needs:    Medical: Not on file    Non-medical: Not on file  Tobacco Use  . Smoking status: Former Smoker    Packs/day: 2.00    Years: 65.00    Pack years: 130.00    Types: Cigarettes    Last attempt to quit: 08/11/2016    Years since quitting: 1.3  . Smokeless tobacco: Never Used  Substance and Sexual Activity  . Alcohol use: Not Currently    Comment: 1 beer every 2 weeks; h/o heavy drinking  . Drug use: No  . Sexual activity: Not on file  Lifestyle  . Physical activity:    Days per week: Not on file    Minutes per session: Not on file  . Stress: Not on file  Relationships  . Social connections:    Talks on phone: Not on file    Gets together: Not on file    Attends religious service: Not on file    Active member of club or organization: Not on file    Attends meetings of clubs or organizations: Not on file    Relationship status: Not on file  . Intimate partner violence:    Fear of current or ex partner: Not on file    Emotionally abused: Not on file    Physically abused: Not on file    Forced sexual activity: Not on file  Other Topics Concern  . Not on file  Social History Narrative  . Not on file    Allergies  Allergen Reactions  . Ambien [Zolpidem Tartrate] Other  (See Comments)    confusion    Family History  Problem Relation Age of Onset  . Heart disease Mother        hx CABG  . Heart disease Father        hx of CABG  . Cancer Sister   . Heart attack Brother   . Heart disease Brother     Prior to Admission medications   Medication Sig Start Date End Date Taking? Authorizing Provider  apixaban (  ELIQUIS) 5 MG TABS tablet Take 1 tablet (5 mg total) by mouth 2 (two) times daily. 11/06/17  Yes Lorretta Harp, MD  aspirin EC 81 MG tablet Take 81 mg by mouth daily.   Yes [provider]  atorvastatin (LIPITOR) 40 MG tablet Take 40 mg by mouth daily at 6 PM.  12/26/12  Yes [provider]  B Complex-Biotin-FA (VITAMIN B50 COMPLEX PO) Take 1 tablet by mouth daily.   Yes [provider]  diphenhydrAMINE (BENADRYL) 25 MG tablet Take 25-50 mg by mouth See admin instructions. Take two capsules (50 mg) by mouth daily at bedtime, may also take one or two capsule (25-50 mg) during the night as needed for sleep   Yes [provider]  ferrous sulfate 325 (65 FE) MG tablet Take 1 tablet (325 mg total) by mouth 2 (two) times daily with a meal. 10/25/17  Yes Sherwood Gambler, MD  fludrocortisone (FLORINEF) 0.1 MG tablet Take 1 tablet (0.1 mg total) by mouth daily. 11/21/17  Yes Lorretta Harp, MD  folic acid (FOLVITE) 353 MCG tablet Take 800 mcg by mouth daily.   Yes [provider]  furosemide (LASIX) 40 MG tablet Take 1 tablet (40 mg total) by mouth daily. 11/04/17  Yes Dessa Phi, DO  Garlic 2992 MG CAPS Take 1,000 mg by mouth daily.    Yes [provider]  guaiFENesin-dextromethorphan (ROBITUSSIN DM) 100-10 MG/5ML syrup Take 5 mLs by mouth every 4 (four) hours as needed for cough. 12/24/16  Yes Alphonzo Grieve, MD  ipratropium-albuterol (DUONEB) 0.5-2.5 (3) MG/3ML SOLN Take 3 mLs by nebulization every 4 (four) hours as needed (shortness of breath).    Yes [provider]  Lactobacillus (PROBIOTIC  ACIDOPHILUS PO) Take 1 tablet by mouth daily.   Yes [provider]  metoprolol tartrate (LOPRESSOR) 25 MG tablet Take 1 tablet (25 mg total) by mouth 2 (two) times daily. Patient taking differently: Take 12.5 mg by mouth 2 (two) times daily.  11/03/17  Yes Dessa Phi, DO  Omega-3 Fatty Acids (FISH OIL) 1200 MG CAPS Take 1,200 mg by mouth 2 (two) times daily.   Yes [provider]  OVER THE COUNTER MEDICATION Place 2 drops under the tongue 2 (two) times daily. CBD tincture/hemp flower extract   Yes [provider]  OXYGEN Inhale 2 L into the lungs continuous.   Yes [provider]  polyethylene glycol (MIRALAX / GLYCOLAX) packet Take by mouth See admin instructions. Mix 3 teaspoonsful (15 mls) in water and drink every evening   Yes [provider]  PROAIR HFA 108 (90 Base) MCG/ACT inhaler Inhale 2 puffs into the lungs every 6 (six) hours as needed for wheezing or shortness of breath. 12/24/16  Yes Alphonzo Grieve, MD  ranitidine (ZANTAC) 75 MG tablet Take 75 mg by mouth daily.   Yes [provider]  thiamine 100 MG tablet Take 1 tablet (100 mg total) by mouth daily. 12/28/16  Yes Lorretta Harp, MD  varenicline (CHANTIX PAK) 0.5 MG X 11 & 1 MG X 42 tablet Take 0.5 mg by mouth 2 (two) times daily.    Yes [provider]  vitamin E (VITAMIN E) 400 UNIT capsule Take 400 Units by mouth daily.   Yes [provider]  Wheat Dextrin (BENEFIBER PO) Take 20 mLs by mouth daily. Mix 4 teaspoon (20 ml) in water and drink daily   Yes [provider]  diltiazem (CARDIZEM) 30 MG tablet Take 1 tablet (30  mg total) by mouth every 8 (eight) hours. Patient not taking: Reported on 12/04/2017 11/03/17   Dessa Phi, DO  potassium chloride SA (K-DUR,KLOR-CON) 20 MEQ tablet Take 1 tablet (20 mEq total) by mouth daily. Patient not taking: Reported on 12/04/2017 10/25/17   Sherwood Gambler, MD    Physical Exam: Vitals:   12/04/17 1328  12/04/17 1330 12/04/17 1430 12/04/17 1545  BP: 93/67 103/68 (!) 96/57 111/71  Pulse: 80 88 80 99  Resp: 20 20 20  (!) 23  Temp:      TempSrc:      SpO2: 96% 95% 96% 100%     General:  Appears calm and comfortable and is NAD; BP during my evaluation was 60/33-82/51 Eyes:  PERRL, EOMI, normal lids, iris ENT:  grossly normal hearing, lips & tongue, mmm Neck:  no LAD, masses or thyromegaly; no carotid bruits Cardiovascular: Irregularly irregular, no m/r/g. 3+ LE edema.  Respiratory: Scattered wheezes and rales without apparent crackles.  Increased respiratory effort. Abdomen:  soft, NT, ND, NABS Back:   normal alignment, no CVAT Skin:  no rash or induration seen on limited exam Musculoskeletal:  grossly normal tone BUE/BLE, good ROM, no bony abnormality.  RLE is somewhat larger than left but this does appear to be chronic in nature and there is no erythema noted. Psychiatric:  grossly normal mood and affect, speech fluent and appropriate, AOx3 Neurologic:  CN 2-12 grossly intact, moves all extremities in coordinated fashion, sensation intact    Radiological Exams on Admission: Dg Chest 2 View  Result Date: 12/04/2017 CLINICAL DATA:  Short of breath.  Cough. EXAM: CHEST - 2 VIEW COMPARISON:  10/28/2017 FINDINGS: Mild cardiomegaly. Vascular congestion. Diffuse interstitial infiltrates. Airspace edema towards the lung bases. Small pleural effusions. No pneumothorax. IMPRESSION: CHF with pulmonary edema and small pleural effusions. Electronically Signed   By: Marybelle Killings M.D.   On: 12/04/2017 11:49    EKG: Independently reviewed.  Afib with rate 92; nonspecific ST changes with no evidence of acute ischemia   Labs on Admission: I have personally reviewed the available labs and imaging studies at the time of the admission.  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   Persistent atrial fibrillation (HCC)   Hypotension   Acute on chronic respiratory failure associated with diastolic HCF -Patient with O2-dependent chronic respiratory failure presenting with worsening SOB and edema -He was recently admitted for CHF exacerbation and this appears to be a similar presentation -This is complicated by his hypotension (see below) -CXR consistent with pulmonary edema -Normal WBC count, no fever; will not give antibiotics -Elevated BNP -Known afib, but he is rate controlled (see below) -With elevated BNP and abnl CXR, recurrent CHF exacerbation seems most probable as diagnosis -Will admit with telemetry -Will not repeat echocardiogram - although this appears to be diastolic heart failure and the diastolic function was not evaluated recently and so this may need to be repeated now that there is no RVR component -  Will continue ASA -He may benefit from ACE if his renal function and BP can support this in the future -No beta blocker due to hypotension on presentation -CHF order set utilized -Cardiology consult, and he may benefit from CHF team consult (not apparently available today); I spoke with Dr. Meda Coffee and she will see the patient today. -Was given Lasix 40 mg PO x 1 in ER  -He needs diuresis and yet his BP is very tenuous - will order low-dose Lasix drip to try to initiate diuresis while conserving his BP -He is likely to also benefit from inotropic medication - but will defer to cardiology -Continue [place] O2 for now -Repeat EKG in  AM -Negative troponin, low suspicion for ACS so will not trend -Of note, he does have chronic COPD and anemia as background noise -There was also some apparent concern in the ER for PE; this seems less likely, particularly since he is on Eliquis and reports compliance.  Will cancel VQ scan for now but will transition to Lovenox instead of Eliquis. -Will place TED hose  Hypotension -He brought in a record of his BP at home with BID readings; they range from 35-329 systolic, and >924 is quite uncommon for him -His beta blocker dose was recently decreased -At this time, he seems unable to be able to tolerate any BP medications -He is likely to benefit from inotropic medication, either Dopamine or Milrinone - will defer initiation of this medication to cardiology -For now, will attempt to gently diurese - this is likely to improve his BP once he has improved renal perfusion -He was started recently on Florinef - this does not appear to have improved his BP and may have led to worsening of his heart failure.  Will discontinue at this time.  COPD -Continue O2; Duonebs; and prn Albuterol -This does not appear to be a significant contributor to his acute issue  Afib on Eliquis -Rate controlled at this time -Hold Eliquis and transition to Lovenox per pharmacy for now  HLD -Continue Lipitor  CKD -Likely somewhat worse due to decreased renal perfusion -Anticipate gradual improvement back to baseline with diuresis  Anemia -Appears to be stable -Recent EGD/colonoscopy generally unremarkable (solitary polyp) -Would not transfuse at this time as it seems fairly unlikely this is a significant factor in his SOB and it would likely worsen his volume overload  DVT prophylaxis:  Lovenox Code Status: Full - confirmed with patient/family Family Communication: Wife, daughter, and granddaughter were present throughout evaluation Disposition Plan:  Home once clinically improved Consults called:  Cardiology  Admission status: Admit - It is my clinical opinion that admission to [facility] is reasonable and necessary because of the expectation that this patient will require hospital care that crosses at least 2 midnights to treat this condition based on the medical complexity of the problems presented.  Given the aforementioned information, the predictability of an adverse outcome is felt to be significant.    Karmen Bongo MD [facility]  If note is complete, please contact covering daytime or nighttime physician. [email or web address] Password [facility]  12/04/2017, 4:08 PM

## 2017-12-04 NOTE — ED Notes (Signed)
Lap reports adding on D-dimer to previous blood draw

## 2017-12-05 ENCOUNTER — Other Ambulatory Visit: Payer: Self-pay | Admitting: Pharmacist

## 2017-12-05 ENCOUNTER — Encounter (HOSPITAL_COMMUNITY): Payer: PPO

## 2017-12-05 ENCOUNTER — Ambulatory Visit (HOSPITAL_COMMUNITY): Admission: RE | Admit: 2017-12-05 | Payer: PPO | Source: Ambulatory Visit

## 2017-12-05 ENCOUNTER — Other Ambulatory Visit: Payer: Self-pay

## 2017-12-05 ENCOUNTER — Ambulatory Visit: Payer: Self-pay | Admitting: Pharmacist

## 2017-12-05 DIAGNOSIS — N183 Chronic kidney disease, stage 3 (moderate): Secondary | ICD-10-CM

## 2017-12-05 DIAGNOSIS — I959 Hypotension, unspecified: Secondary | ICD-10-CM

## 2017-12-05 LAB — CBC WITH DIFFERENTIAL/PLATELET
Abs Immature Granulocytes: 0.1 10*3/uL (ref 0.0–0.1)
BASOS ABS: 0 10*3/uL (ref 0.0–0.1)
BASOS PCT: 0 %
Eosinophils Absolute: 0 10*3/uL (ref 0.0–0.7)
Eosinophils Relative: 0 %
HCT: 23.5 % — ABNORMAL LOW (ref 39.0–52.0)
Hemoglobin: 7.5 g/dL — ABNORMAL LOW (ref 13.0–17.0)
Immature Granulocytes: 1 %
LYMPHS PCT: 8 %
Lymphs Abs: 0.5 10*3/uL — ABNORMAL LOW (ref 0.7–4.0)
MCH: 29.4 pg (ref 26.0–34.0)
MCHC: 31.9 g/dL (ref 30.0–36.0)
MCV: 92.2 fL (ref 78.0–100.0)
MONO ABS: 0.3 10*3/uL (ref 0.1–1.0)
Monocytes Relative: 4 %
NEUTROS PCT: 87 %
Neutro Abs: 6.1 10*3/uL (ref 1.7–7.7)
PLATELETS: 160 10*3/uL (ref 150–400)
RBC: 2.55 MIL/uL — AB (ref 4.22–5.81)
RDW: 19.9 % — ABNORMAL HIGH (ref 11.5–15.5)
WBC: 7 10*3/uL (ref 4.0–10.5)

## 2017-12-05 LAB — BASIC METABOLIC PANEL
Anion gap: 9 (ref 5–15)
BUN: 21 mg/dL — AB (ref 6–20)
CO2: 31 mmol/L (ref 22–32)
CREATININE: 1.74 mg/dL — AB (ref 0.61–1.24)
Calcium: 8.4 mg/dL — ABNORMAL LOW (ref 8.9–10.3)
Chloride: 96 mmol/L — ABNORMAL LOW (ref 101–111)
GFR, EST AFRICAN AMERICAN: 41 mL/min — AB (ref >60)
GFR, EST NON AFRICAN AMERICAN: 36 mL/min — AB (ref >60)
Glucose, Bld: 115 mg/dL — ABNORMAL HIGH (ref 65–99)
POTASSIUM: 2.9 mmol/L — AB (ref 3.5–5.1)
SODIUM: 136 mmol/L (ref 135–145)

## 2017-12-05 LAB — POTASSIUM: POTASSIUM: 3.7 mmol/L (ref 3.5–5.1)

## 2017-12-05 LAB — MAGNESIUM: Magnesium: 2.2 mg/dL (ref 1.7–2.4)

## 2017-12-05 MED ORDER — POTASSIUM CHLORIDE CRYS ER 20 MEQ PO TBCR
40.0000 meq | EXTENDED_RELEASE_TABLET | ORAL | Status: AC
  Administered 2017-12-05 (×2): 40 meq via ORAL
  Filled 2017-12-05 (×2): qty 2

## 2017-12-05 MED ORDER — IPRATROPIUM-ALBUTEROL 0.5-2.5 (3) MG/3ML IN SOLN
3.0000 mL | Freq: Three times a day (TID) | RESPIRATORY_TRACT | Status: DC
  Administered 2017-12-05 – 2017-12-07 (×5): 3 mL via RESPIRATORY_TRACT
  Filled 2017-12-05 (×5): qty 3

## 2017-12-05 MED ORDER — POLYETHYLENE GLYCOL 3350 17 G PO PACK
17.0000 g | PACK | Freq: Every day | ORAL | Status: DC
  Administered 2017-12-05 – 2017-12-06 (×2): 17 g via ORAL
  Filled 2017-12-05 (×2): qty 1

## 2017-12-05 MED ORDER — MIDODRINE HCL 5 MG PO TABS
5.0000 mg | ORAL_TABLET | Freq: Three times a day (TID) | ORAL | Status: DC
  Administered 2017-12-05 – 2017-12-07 (×6): 5 mg via ORAL
  Filled 2017-12-05 (×7): qty 1

## 2017-12-05 NOTE — ED Notes (Signed)
Heart healthy lunch tray ordered 

## 2017-12-05 NOTE — Progress Notes (Signed)
PROGRESS NOTE    Kevin Watkins  [name]:[id number] DOB: 1940-11-21 DOA: 12/04/2017 PCP: Ronita Hipps, MD    Brief Narrative:  Kevin Watkins is a 77 y.o. male with medical history significant of CHF (preserved EF, uncertain diastolic function on 5/63); afib on Eliquis; CAD; PAD; HTN; COPD; and stage 3 CKD who presents with SOB. He was found to be in acute CHF Exacerbation.   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  CXR shows pulmonary edema.  Started on IV lasix, with good diuresis. Continue with strict intake and output.  Daily weights.  Cardiology consulted and recommendations given.  Monitor potassium and creatinine while on lasix.  [place] oxygen to keep sats greater than 90%,  At home, pt is on 2 lit of [place] at baseline.    COPD:  No wheezing heard. Resume duo nebs.    Anemia;  Pt 's baseline hemoglobin until 2018 was around 11, from this year it has been hovering beterrn 7 to 8.  Anemia panel from last month reviewed. Showed some iron deficiency .   Hypotension:  On midodrine and appears to have improved.    Atrial fibrillation  Rate controlled and on eliquis.    Hyperlipidemia:  Resume lipitor.       DVT prophylaxis: eliquis.  Code Status: full code.  Family Communication: family at bedside.  Disposition Plan: pending resolution of the heart failure.    Consultants:   Cardiology.    Procedures: none     Antimicrobials: none.    Subjective: Breathing has improved.   Objective: Vitals:   12/05/17 1321 12/05/17 1323 12/05/17 1324 12/05/17 1352  BP:  90/68    Pulse:  100 72 73  Resp:  15  16  Temp:  97.6 F (36.4 C)    TempSrc:  Oral    SpO2:   90% 94%  Weight: 84.3 kg (185 lb 14.4 oz)     Height: 5\' 7"  (1.702 m)       Intake/Output Summary (Last 24 hours) at 12/05/2017 1451 Last data filed at 12/04/2017 2356 Gross per 24 hour  Intake -  Output 1500 ml  Net -1500 ml   Filed Weights   12/05/17 1321  Weight: 84.3 kg (185 lb 14.4 oz)    Examination:  General exam: Appears calm and comfortable on 3l it of [place] oxygen.  Respiratory system: basilar rales present, no wheezing or rhonchi.  Cardiovascular system: S1 & S2 heard, RRR. No JVD, Gastrointestinal system: Abdomen is nondistended, soft and nontender. No organomegaly or masses felt. Normal bowel sounds heard. Central nervous system: Alert and oriented. Non focal.  Extremities: Symmetric 5 x 5 power. Pedal edema present.  Skin: No rashes, lesions or ulcers Psychiatry: . Mood & affect appropriate.     Data Reviewed: I have personally reviewed following labs and imaging studies  CBC: Recent Labs  Lab 12/04/17 1108 12/05/17 0251  WBC 6.7 7.0  NEUTROABS  --  6.1  HGB 7.8* 7.5*  HCT 24.7* 23.5*  MCV 92.9 92.2  PLT 180 951   Basic Metabolic Panel: Recent Labs  Lab 12/04/17 1108 12/05/17 0251  NA 135 136  K 3.6 2.9*  CL 96* 96*  CO2 25 31  GLUCOSE 94 115*  BUN 24* 21*  CREATININE 1.94* 1.74*  CALCIUM 8.7* 8.4*   GFR: Estimated Creatinine Clearance: 36.3 mL/min (A) (by C-G formula based on SCr of 1.74 mg/dL (H)). Liver Function Tests: No results for input(s): AST, ALT, ALKPHOS, BILITOT, PROT, ALBUMIN in the last 168 hours. No results for input(s): LIPASE, AMYLASE in the last 168 hours. No results for input(s): AMMONIA in the last 168 hours. Coagulation Profile: No results for input(s): INR, PROTIME  in the last 168 hours. Cardiac Enzymes: No results for input(s): CKTOTAL, CKMB, CKMBINDEX, TROPONINI in the last 168 hours. BNP (last 3 results) No results for input(s): PROBNP in the last 8760 hours. HbA1C: No results for input(s): HGBA1C in the last 72 hours. CBG: No results for input(s): GLUCAP in the last 168 hours. Lipid Profile: No results for input(s): CHOL, HDL, LDLCALC, TRIG, CHOLHDL, LDLDIRECT in the last 72 hours. Thyroid Function Tests: No results for input(s): TSH, T4TOTAL, FREET4, T3FREE, THYROIDAB in the last 72 hours. Anemia Panel: No results for input(s): VITAMINB12, FOLATE, FERRITIN, TIBC, IRON, RETICCTPCT in the last 72 hours. Sepsis Labs: No results for input(s): PROCALCITON, LATICACIDVEN in the last 168 hours.  No results found for this or any previous visit (from the past 240 hour(s)).       Radiology Studies: Dg Chest 2 View  Result Date: 12/04/2017 CLINICAL DATA:  Short of breath.  Cough. EXAM: CHEST - 2 VIEW COMPARISON:  10/28/2017 FINDINGS: Mild cardiomegaly. Vascular congestion. Diffuse interstitial infiltrates. Airspace edema towards the lung bases. Small pleural effusions. No pneumothorax. IMPRESSION: CHF with pulmonary edema and small pleural effusions. Electronically Signed   By: Marybelle Killings M.D.   On: 12/04/2017 11:49        Scheduled Meds: . apixaban  5 mg Oral BID  . atorvastatin  40 mg Oral q1800  . famotidine  20 mg Oral Daily  . ferrous sulfate  325 mg Oral BID WC  . furosemide  40 mg Intravenous Daily  . ipratropium-albuterol  3 mL Nebulization TID  . midodrine  5 mg Oral TID WC  . polyethylene glycol  17 g Oral q1800  . sodium chloride flush  3 mL Intravenous Q12H  . thiamine  100 mg Oral Daily  . varenicline  0.5 mg Oral BID   Continuous Infusions: . sodium chloride       LOS: 1 day    Time spent: 35 minutes.    Hosie Poisson, MD [facility] Pager [phone]  If 7PM-7AM, please contact  night-coverage [email or web address] Password [facility] 12/05/2017, 2:51 PM

## 2017-12-05 NOTE — ED Notes (Signed)
Report given to [place], [place]

## 2017-12-05 NOTE — Progress Notes (Signed)
PT Cancellation Note  Patient Details Name: Kevin Watkins MRN: [id number] DOB: January 27, 1941   Cancelled Treatment:    Reason Eval/Treat Not Completed: Medical issues which prohibited therapy Spoke with RN and pt with hypotension. Requesting to hold PT. Will follow up as pt medically appropriate.   Leighton Ruff, PT, DPT  Acute Rehabilitation Services  Pager: [phone]  [name] 12/05/2017, 11:17 AM

## 2017-12-05 NOTE — ED Notes (Signed)
Family at bedside; no needs

## 2017-12-05 NOTE — Progress Notes (Addendum)
Progress Note  Patient Name: Kevin Watkins Date of Encounter: 12/05/2017  Primary Cardiologist: Quay Burow, MD  Subjective   States he is feeling somewhat better today. Still dyspneic at rest.    Inpatient Medications    Scheduled Meds: . apixaban  5 mg Oral BID  . atorvastatin  40 mg Oral q1800  . famotidine  20 mg Oral Daily  . ferrous sulfate  325 mg Oral BID WC  . furosemide  40 mg Intravenous Daily  . ipratropium-albuterol  3 mL Nebulization Q6H  . midodrine  2.5 mg Oral TID WC  . polyethylene glycol  17 g Oral See admin instructions  . sodium chloride flush  3 mL Intravenous Q12H  . thiamine  100 mg Oral Daily  . varenicline  0.5 mg Oral BID   Continuous Infusions: . sodium chloride     PRN Meds: sodium chloride, acetaminophen, albuterol, diphenhydrAMINE, ondansetron (ZOFRAN) IV, sodium chloride flush   Vital Signs    Vitals:   12/05/17 0830 12/05/17 0906 12/05/17 0915 12/05/17 0945  BP: (!) 79/54 (!) 99/50 105/66 94/62  Pulse: 89 (!) 101 95 (!) 104  Resp: (!) 25 (!) 28 (!) 28 (!) 29  Temp:      TempSrc:      SpO2: 99% 97% 96% 97%    Intake/Output Summary (Last 24 hours) at 12/05/2017 1058 Last data filed at 12/04/2017 2356 Gross per 24 hour  Intake -  Output 1500 ml  Net -1500 ml   There were no vitals filed for this visit.  Telemetry    Afib Rate 90-100s - Personally Reviewed  ECG    Afib - Personally Reviewed  Physical Exam   General: Older W male appearing in no acute distress, wearing [place]. Head: Normocephalic, atraumatic.  Neck: Supple without bruits, JVD. Lungs:  Resp regular, but labored with conversation, expiratory wheezing noted. Heart: Irreg Irreg, S1, S2, no murmur; no rub. Abdomen: Soft, non-tender, non-distended with normoactive bowel sounds.  Extremities: No clubbing, cyanosis, 1+ Bilateral LE edema. Distal pedal pulses are 2+ bilaterally. Neuro: Alert and oriented X 3. Moves all extremities spontaneously. Psych: Normal  affect.  Labs    Chemistry Recent Labs  Lab 12/04/17 1108 12/05/17 0251  NA 135 136  K 3.6 2.9*  CL 96* 96*  CO2 25 31  GLUCOSE 94 115*  BUN 24* 21*  CREATININE 1.94* 1.74*  CALCIUM 8.7* 8.4*  GFRNONAA 31* 36*  GFRAA 36* 41*  ANIONGAP 14 9     Hematology Recent Labs  Lab 12/04/17 1108 12/05/17 0251  WBC 6.7 7.0  RBC 2.66* 2.55*  HGB 7.8* 7.5*  HCT 24.7* 23.5*  MCV 92.9 92.2  MCH 29.3 29.4  MCHC 31.6 31.9  RDW 20.0* 19.9*  PLT 180 160    Cardiac EnzymesNo results for input(s): TROPONINI in the last 168 hours.  Recent Labs  Lab 12/04/17 1125  TROPIPOC 0.02     BNP Recent Labs  Lab 12/04/17 1335  BNP 401.3*     DDimer  Recent Labs  Lab 12/04/17 1108  DDIMER 1.62*      Radiology    Dg Chest 2 View  Result Date: 12/04/2017 CLINICAL DATA:  Short of breath.  Cough. EXAM: CHEST - 2 VIEW COMPARISON:  10/28/2017 FINDINGS: Mild cardiomegaly. Vascular congestion. Diffuse interstitial infiltrates. Airspace edema towards the lung bases. Small pleural effusions. No pneumothorax. IMPRESSION: CHF with pulmonary edema and small pleural effusions. Electronically Signed   By: Rodena Goldmann.D.  On: 12/04/2017 11:49    Cardiac Studies   N/a   Patient Profile     77 y.o. male with a hx of coronary artery disease, chronic diastolic congestive heart failure, COPD on home O2, hypertension, peripheral vascular disease, permanent atrial fibrillation, tobacco abuse who presented with acute on chronic diastolic congestive heart failure.  Assessment & Plan    1. Acute on chronic diastolic congestive heart failure: remains somewhat volume overloaded on exam. Has diuresed with 1.5L UOP thus far. Difficult to diuresis given his hypotension.  -- midodrine 2.5 mg p.o. 3 times daily was added yesterday. Will further increase to 5mg  today. Would continue with IV lasix today.  2. Chronic hypotension/orthostasis: his Florinef was discontinued because of potentially  exacerbating CHF. Increase midodrine as above. Continue to hold metoprolol but may need to resume low-dose for his atrial fibrillation rate control. HR in the low 100s.   3. Permanent atrial fibrillation: continue to hold metoprolol for now. Amiodarone would be a consideration for rate control as it does not have blood pressure lowering effects.  However he has home O2 dependent COPD. Could consider Dig is rate elevates. CHADSvasc 5. On Eliquis for [place].     4. Chronic stage III kidney disease: follow renal function closely with diuresis. 1.9>>1.74  5. Anemia: patient complains of significant fatigue and anemia is likely contributing.  He had previous EGD and colonoscopy that were unrevealing.  Continue iron. Continue to follow CBC  6. COPD: per primary care.  7. Hypokalemia: replete   Signed, Reino Bellis, NP  12/05/2017, 10:58 AM  Pager # [phone]    I have examined the patient and reviewed assessment and plan and discussed with patient.  Agree with above as stated.  Try to transition to oral Lasix tomorrow.  He would like to go home as he feels close to baseline.  Midodrine for hypotension.  Larae Grooms   For questions or updates, please contact [facility] Please consult [email or web address] for contact info under Cardiology/STEMI.

## 2017-12-05 NOTE — Patient Outreach (Signed)
[facility]) Care Management  12/05/2017  Kevin Watkins [birth year] [id number]  Patient's chart was reviewed prior to calling him regarding medication assistance. Unfortunately, patient is currently hospitalized.   Plan: Call patient back in 5-7 business days.  Elayne Guerin, PharmD, [place] Clinical Pharmacist [phone]

## 2017-12-05 NOTE — ED Notes (Signed)
Patient is resting comfortably. 

## 2017-12-06 ENCOUNTER — Ambulatory Visit: Payer: Self-pay

## 2017-12-06 DIAGNOSIS — Z7901 Long term (current) use of anticoagulants: Secondary | ICD-10-CM

## 2017-12-06 DIAGNOSIS — I481 Persistent atrial fibrillation: Secondary | ICD-10-CM

## 2017-12-06 LAB — BASIC METABOLIC PANEL
ANION GAP: 10 (ref 5–15)
BUN: 18 mg/dL (ref 6–20)
CHLORIDE: 96 mmol/L — AB (ref 101–111)
CO2: 29 mmol/L (ref 22–32)
CREATININE: 1.46 mg/dL — AB (ref 0.61–1.24)
Calcium: 8.8 mg/dL — ABNORMAL LOW (ref 8.9–10.3)
GFR calc non Af Amer: 44 mL/min — ABNORMAL LOW (ref >60)
GFR, EST AFRICAN AMERICAN: 51 mL/min — AB (ref >60)
Glucose, Bld: 111 mg/dL — ABNORMAL HIGH (ref 65–99)
POTASSIUM: 3.6 mmol/L (ref 3.5–5.1)
SODIUM: 135 mmol/L (ref 135–145)

## 2017-12-06 MED ORDER — SODIUM CHLORIDE 0.9 % IV BOLUS
250.0000 mL | Freq: Once | INTRAVENOUS | Status: AC
  Administered 2017-12-06: 250 mL via INTRAVENOUS

## 2017-12-06 MED ORDER — FUROSEMIDE 40 MG PO TABS
40.0000 mg | ORAL_TABLET | Freq: Every day | ORAL | Status: DC
  Administered 2017-12-07: 40 mg via ORAL
  Filled 2017-12-06: qty 1

## 2017-12-06 NOTE — Discharge Instructions (Signed)

## 2017-12-06 NOTE — Progress Notes (Signed)
Progress Note  Patient Name: Kevin Watkins Date of Encounter: 12/06/2017  Primary Cardiologist: Quay Burow, MD   Subjective   Feels well  Inpatient Medications    Scheduled Meds: . apixaban  5 mg Oral BID  . atorvastatin  40 mg Oral q1800  . famotidine  20 mg Oral Daily  . ferrous sulfate  325 mg Oral BID WC  . [START ON 12/07/2017] furosemide  40 mg Oral Daily  . ipratropium-albuterol  3 mL Nebulization TID  . midodrine  5 mg Oral TID WC  . polyethylene glycol  17 g Oral q1800  . sodium chloride flush  3 mL Intravenous Q12H  . thiamine  100 mg Oral Daily  . varenicline  0.5 mg Oral BID   Continuous Infusions: . sodium chloride     PRN Meds: sodium chloride, acetaminophen, albuterol, diphenhydrAMINE, ondansetron (ZOFRAN) IV, sodium chloride flush   Vital Signs    Vitals:   12/06/17 0629 12/06/17 0801 12/06/17 0950 12/06/17 1148  BP: 101/64 98/62  (!) 84/72  Pulse: 81 88  98  Resp:  18  20  Temp:  98 F (36.7 C)  98 F (36.7 C)  TempSrc:  Oral  Oral  SpO2:  91% (!) 88% 97%  Weight:      Height:        Intake/Output Summary (Last 24 hours) at 12/06/2017 1427 Last data filed at 12/06/2017 1300 Gross per 24 hour  Intake 960 ml  Output 1000 ml  Net -40 ml   Filed Weights   12/05/17 1321 12/06/17 0521  Weight: 185 lb 14.4 oz (84.3 kg) 185 lb 9.6 oz (84.2 kg)    Telemetry    AFib - Personally Reviewed  ECG   Physical Exam   GEN: No acute distress.   Neck: No JVD Cardiac: irregularly irregular, no murmurs, rubs, or gallops.  Respiratory: Clear to auscultation bilaterally. GI: Soft, nontender, non-distended  MS: No edema; No deformity. Neuro:  Nonfocal  Psych: Normal affect   Labs    Chemistry Recent Labs  Lab 12/04/17 1108 12/05/17 0251 12/05/17 1806 12/06/17 0751  NA 135 136  --  135  K 3.6 2.9* 3.7 3.6  CL 96* 96*  --  96*  CO2 25 31  --  29  GLUCOSE 94 115*  --  111*  BUN 24* 21*  --  18  CREATININE 1.94* 1.74*  --  1.46*    CALCIUM 8.7* 8.4*  --  8.8*  GFRNONAA 31* 36*  --  44*  GFRAA 36* 41*  --  51*  ANIONGAP 14 9  --  10     Hematology Recent Labs  Lab 12/04/17 1108 12/05/17 0251  WBC 6.7 7.0  RBC 2.66* 2.55*  HGB 7.8* 7.5*  HCT 24.7* 23.5*  MCV 92.9 92.2  MCH 29.3 29.4  MCHC 31.6 31.9  RDW 20.0* 19.9*  PLT 180 160    Cardiac EnzymesNo results for input(s): TROPONINI in the last 168 hours.  Recent Labs  Lab 12/04/17 1125  TROPIPOC 0.02     BNP Recent Labs  Lab 12/04/17 1335  BNP 401.3*     DDimer  Recent Labs  Lab 12/04/17 1108  DDIMER 1.62*     Radiology    No results found.  Cardiac Studies     Patient Profile     77 y.o. male with acute on chronic diastolic heart falure  Assessment & Plan    1) AFib: Continue current meds.  ELiquis for stroke prevention.  No dizziness with standing.  Orthostasis limits rate control meds.  COntinue midodrine.  2) Appears euvolemic.  He titrates Lasix at home based on weight.  Change to Lasix 40 mg PO daily.  THis may help BP as well.  3) CKD: Cr improving.  Ok to discharge from a heart failure standpoint.  BP fluctuations may limit discharge planning, but he is asymptomatic.  For questions or updates, please contact [place] Please consult [email or web address] for contact info under Cardiology/STEMI.      Signed, Larae Grooms, MD  12/06/2017, 2:27 PM

## 2017-12-06 NOTE — Progress Notes (Signed)
PROGRESS NOTE  Kevin Watkins  PZW:[id number] DOB: [birth year] DOA: 12/04/2017 PCP: Ronita Hipps, MD  Brief Narrative: Kevin Watkins Coxis a 77 y.o.malewith medical history significant ofCHF (preserved EF, uncertain diastolic function on 4/23); afib on Eliquis; CAD; PAD; HTN; COPD; and stage 3 CKD who presents withSOB. He was found to be in acute CHF Exacerbation.   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  - Transition to po lasix tomorrow, DC in AM if stable.  - Monitor I/O, daily weights. Will need cardiology follow up.   Hypotension:  - DC'ed florinef as this may have precipitated CHF exacerbation.  - Continue midodrine (dose increased) and monitor orthostatic vital signs.  AFib:  - Continue to monitor rate. Metoprolol had to be stopped due to hypotension. 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  - Continue home medications and prn breathing treatments  Pyuria: No urinary symptoms and no urine culture sent.  - Recheck at follow up with culture or if symptoms develop. No evidence of systemic infection.   Stage III CKD: Cr continues to improve - Monitor BMP in AM  Iron deficiency anemia: Hgb at recent baseline. Reportedly had essentially negative EGD and colonoscopy, no clinical evidence of bleeding.  - Continue monitoring closely. - Continue  iron  Hyperlipidemia:  - Continue lipitor  DVT prophylaxis: Eliquis Code Status: Full Family Communication: Wife Disposition Plan: Home in AM if creatinine, respiratory status, and blood pressure are stable.  Consultants:   Cardiology  Procedures:   None  Antimicrobials:  None   Subjective: Breathing is better "not quite up to par" though, by which he means he has not returned to his baseline, particularly with exertion. No chest pain.   Objective: Vitals:   12/06/17 0950 12/06/17 1148 12/06/17 1605 12/06/17 1618  BP:  (!) 84/72  96/74  Pulse:  98  89  Resp:  20  18  Temp:  98 F (36.7 C)  (!) 97.4 F (36.3 C)  TempSrc:  Oral  Oral  SpO2: (!) 88% 97% 98% 98%  Weight:      Height:        Intake/Output Summary (Last 24 hours) at 12/06/2017 1810 Last data filed at 12/06/2017 1700 Gross per 24 hour  Intake 1440 ml  Output 1200 ml  Net 240 ml   Filed Weights   12/05/17 1321 12/06/17 0521  Weight: 84.3 kg (185 lb 14.4 oz) 84.2 kg (185 lb 9.6 oz)    Gen: 77 y.o. male in no distress Pulm: Non-labored breathing supplemental oxygen. Clear to auscultation bilaterally. CV: Irreg irreg. No murmur, rub, or gallop. No JVD, trace pedal edema. GI: Abdomen soft, non-tender, non-distended, with normoactive bowel sounds. No organomegaly or masses felt. Ext: Warm, no deformities Skin: No rashes, lesions no ulcers Neuro: Alert and oriented. No focal neurological deficits. Psych: Judgement and insight appear normal. Mood & affect appropriate.   Data  Reviewed: I have personally reviewed following labs and imaging studies  CBC: Recent Labs  Lab 12/04/17 1108 12/05/17 0251  WBC 6.7 7.0  NEUTROABS  --  6.1  HGB 7.8* 7.5*  HCT 24.7* 23.5*  MCV 92.9 92.2  PLT 180 536   Basic Metabolic Panel: Recent Labs  Lab 12/04/17 1108 12/05/17 0251 12/05/17 1806 12/06/17 0751  NA 135 136  --  135  K 3.6 2.9* 3.7 3.6  CL 96* 96*  --  96*  CO2 25 31  --  29  GLUCOSE 94 115*   --  111*  BUN 24* 21*  --  18  CREATININE 1.94* 1.74*  --  1.46*  CALCIUM 8.7* 8.4*  --  8.8*  MG  --   --  2.2  --    GFR: Estimated Creatinine Clearance: 43.2 mL/min (A) (by C-G formula based on SCr of 1.46 mg/dL (H)). Liver Function Tests: No results for input(s): AST, ALT, ALKPHOS, BILITOT, PROT, ALBUMIN in the last 168 hours. No results for input(s): LIPASE, AMYLASE in the last 168 hours. No results for input(s): AMMONIA in the last 168 hours. Coagulation Profile: No results for input(s): INR, PROTIME in the last 168 hours. Cardiac Enzymes: No results for input(s): CKTOTAL, CKMB, CKMBINDEX, TROPONINI in the last 168 hours. BNP (last 3 results) No results for input(s): PROBNP in the last 8760 hours. HbA1C: No results for input(s): HGBA1C in the last 72 hours. CBG: No results for input(s): GLUCAP in the last 168 hours. Lipid Profile: No results for input(s): CHOL, HDL, LDLCALC, TRIG, CHOLHDL, LDLDIRECT in the last 72 hours. Thyroid Function Tests: No results for input(s): TSH, T4TOTAL, FREET4, T3FREE, THYROIDAB in the last 72 hours. Anemia Panel: No results for input(s): VITAMINB12, FOLATE, FERRITIN, TIBC, IRON, RETICCTPCT in the last 72 hours. Urine analysis:    Component Value Date/Time   COLORURINE YELLOW 12/04/2017 1337   APPEARANCEUR HAZY (A) 12/04/2017 1337   LABSPEC 1.006 12/04/2017 1337   PHURINE 6.0 12/04/2017 1337   GLUCOSEU NEGATIVE 12/04/2017 1337   HGBUR SMALL (A) 12/04/2017 1337   BILIRUBINUR NEGATIVE 12/04/2017 1337   KETONESUR NEGATIVE 12/04/2017 1337   PROTEINUR NEGATIVE 12/04/2017 1337   NITRITE NEGATIVE 12/04/2017 1337   LEUKOCYTESUR MODERATE (A) 12/04/2017 1337   No results found for this or any previous visit (from the past 240 hour(s)).    Radiology Studies: No results found.  Scheduled Meds: . apixaban  5 mg Oral BID  . atorvastatin  40 mg Oral q1800  . famotidine  20 mg Oral Daily  . ferrous sulfate  325 mg Oral BID WC  . [START ON  12/07/2017] furosemide  40 mg Oral Daily  . ipratropium-albuterol  3 mL Nebulization TID  . midodrine  5 mg Oral TID WC  . polyethylene glycol  17 g Oral q1800  . sodium chloride flush  3 mL Intravenous Q12H  . thiamine  100 mg Oral Daily  . varenicline  0.5 mg Oral BID   Continuous Infusions: . sodium chloride       LOS: 2 days   Time spent: 25 minutes.  Patrecia Pour, MD [facility] [email or web address] Password [email or web address] 12/06/2017, 6:10 PM

## 2017-12-06 NOTE — Evaluation (Signed)
Physical Therapy Evaluation and Discharge Patient Details Name: Kevin Watkins MRN: [id number] DOB: 02/16/1941 Today's Date: 12/06/2017   History of Present Illness  VINAYAK BOBIER is a 77 y.o. male with medical history significant of CHF (preserved EF, uncertain diastolic function on 9/62); afib on Eliquis; CAD; PAD; HTN; COPD; and stage 3 CKD who presents with SOB. He was found to be in acute CHF Exacerbation.     Clinical Impression  Patient evaluated by Physical Therapy with no further acute PT needs identified. All education has been completed and the patient has no further questions. PTA pt community ambulating with rollator, receiving HHPT, living with wife. Upon eval pt presents close to baseline. aystompatic drop in BP with standing. 96/56. SpO2 89% with 120' of ambulation on 2L.  See below for any follow-up Physical Therapy or equipment needs. PT is signing off. Thank you for this referral.    Follow Up Recommendations Home health PT;Supervision for mobility/OOB    Equipment Recommendations  None recommended by PT    Recommendations for Other Services       Precautions / Restrictions        Mobility  Bed Mobility Overal bed mobility: Modified Independent                Transfers Overall transfer level: Modified independent                  Ambulation/Gait Ambulation/Gait assistance: Supervision Ambulation Distance (Feet): 160 Feet Assistive device: (Rollator) Gait Pattern/deviations: Step-through pattern Gait velocity: decreased   General Gait Details: patient ambulating safely with RW, reports feeling close to baseline with respect to mobility. SpO2 89% on 2L    Stairs            Wheelchair Mobility    Modified Rankin (Stroke Patients Only)       Balance Overall balance assessment: Needs assistance   Sitting balance-Leahy Scale: Good       Standing balance-Leahy Scale: Fair Standing balance comment: RW for dynamic tasks, can static  balacne without support                             Pertinent Vitals/Pain Pain Assessment: No/denies pain    Home Living Family/patient expects to be discharged to:: Private residence Living Arrangements: Spouse/significant other Available Help at Discharge: Family Type of Home: House Home Access: Ramped entrance     Home Layout: One level Home Equipment: Clinical cytogeneticist - 4 wheels      Prior Function Level of Independence: Needs assistance   Gait / Transfers Assistance Needed: uses rollator for community ambulation   ADL's / Homemaking Assistance Needed: States his wife sets him up for bathing        Hand Dominance   Dominant Hand: Right    Extremity/Trunk Assessment   Upper Extremity Assessment Upper Extremity Assessment: Overall WFL for tasks assessed    Lower Extremity Assessment Lower Extremity Assessment: Overall WFL for tasks assessed       Communication   Communication: HOH  Cognition Arousal/Alertness: Awake/alert Behavior During Therapy: WFL for tasks assessed/performed Overall Cognitive Status: Within Functional Limits for tasks assessed                                        General Comments      Exercises     Assessment/Plan  PT Assessment All further PT needs can be met in the next venue of care  PT Problem List Cardiopulmonary status limiting activity;Decreased strength;Decreased activity tolerance;Decreased balance       PT Treatment Interventions      PT Goals (Current goals can be found in the Care Plan section)  Acute Rehab PT Goals Patient Stated Goal: return home with HHPT PT Goal Formulation: With patient Time For Goal Achievement: 12/13/17 Potential to Achieve Goals: Good    Frequency     Barriers to discharge        Co-evaluation               AM-PAC PT "6 Clicks" Daily Activity  Outcome Measure Difficulty turning over in bed (including adjusting bedclothes, sheets and  blankets)?: None Difficulty moving from lying on back to sitting on the side of the bed? : None Difficulty sitting down on and standing up from a chair with arms (e.g., wheelchair, bedside commode, etc,.)?: None Help needed moving to and from a bed to chair (including a wheelchair)?: A Little Help needed walking in hospital room?: A Little Help needed climbing 3-5 steps with a railing? : A Little 6 Click Score: 21    End of Session Equipment Utilized During Treatment: Gait belt Activity Tolerance: Patient tolerated treatment well Patient left: in chair;with call bell/phone within reach Nurse Communication: Mobility status PT Visit Diagnosis: Unsteadiness on feet (R26.81)    Time: 5638-7564 PT Time Calculation (min) (ACUTE ONLY): 24 min   Charges:   PT Evaluation $PT Eval Low Complexity: 1 Low PT Treatments $Gait Training: 8-22 mins   PT G Codes:       Reinaldo Berber, PT, DPT Acute Rehab Services Pager: [phone]   Reinaldo Berber 12/06/2017, 5:33 PM

## 2017-12-06 NOTE — Progress Notes (Signed)
According to daugther who is at bedside woke up confused.. Pt was on the chair when seen by rn oriented to person, place and time.. VS taken. BP=84/56. 02 sat=97% at [facility]. Lungs Clear deminished. Paged [facility] Baltazar Najjar.waiting for call back. Pt assisted back to bed. Claims he is a little bit better. Continued to observe pt.

## 2017-12-06 NOTE — Consult Note (Signed)
   [facility] Inpatient Consult   12/06/2017  Kevin Watkins 1941/04/09 [id number]  Patient is currently active with [facility] for chronic disease management services.  Patient has been engaged by a [place], [facility] Pharmacist and Curator for medication assistance, and CSW.  Our community based plan of care has focused on disease management and community resource support.  Patient will receive a post discharge transition of care call and will be evaluated for monthly home visits for assessments and disease process education.  Will follow for progress and disposition.  Went to meet with patient and he was working with Respiratory Therapist,sitting up in chair,  wife at the bedside. Follow up with inpatient RNCM.  Of note, [facility] Care Management services does not replace or interfere with any services that are needed or arranged by inpatient case management or social work.    For additional questions or referrals please contact:  Natividad Brood, RN BSN [place] Liaison  [phone] business mobile phone Toll free office [phone]

## 2017-12-06 NOTE — Plan of Care (Signed)
  Problem: Clinical Measurements: Goal: Ability to maintain clinical measurements within normal limits will improve Outcome: Progressing   Problem: Clinical Measurements: Goal: Diagnostic test results will improve Outcome: Progressing   Problem: Clinical Measurements: Goal: Respiratory complications will improve Outcome: Progressing   

## 2017-12-06 NOTE — Progress Notes (Signed)
Patient refused bed alarm. Wife @ bedside. Will continue to monitor patient.Currently sitting in chair.

## 2017-12-06 NOTE — Progress Notes (Signed)
Patients daughter refused to put on the bed alarm.

## 2017-12-07 DIAGNOSIS — I10 Essential (primary) hypertension: Secondary | ICD-10-CM

## 2017-12-07 DIAGNOSIS — J449 Chronic obstructive pulmonary disease, unspecified: Secondary | ICD-10-CM

## 2017-12-07 LAB — BASIC METABOLIC PANEL
ANION GAP: 6 (ref 5–15)
BUN: 18 mg/dL (ref 6–20)
CALCIUM: 8.7 mg/dL — AB (ref 8.9–10.3)
CO2: 31 mmol/L (ref 22–32)
Chloride: 99 mmol/L — ABNORMAL LOW (ref 101–111)
Creatinine, Ser: 1.57 mg/dL — ABNORMAL HIGH (ref 0.61–1.24)
GFR calc Af Amer: 47 mL/min — ABNORMAL LOW (ref >60)
GFR, EST NON AFRICAN AMERICAN: 41 mL/min — AB (ref >60)
GLUCOSE: 105 mg/dL — AB (ref 65–99)
POTASSIUM: 3.4 mmol/L — AB (ref 3.5–5.1)
SODIUM: 136 mmol/L (ref 135–145)

## 2017-12-07 MED ORDER — POTASSIUM CHLORIDE CRYS ER 20 MEQ PO TBCR
20.0000 meq | EXTENDED_RELEASE_TABLET | Freq: Once | ORAL | Status: AC
  Administered 2017-12-07: 20 meq via ORAL
  Filled 2017-12-07: qty 1

## 2017-12-07 MED ORDER — MIDODRINE HCL 5 MG PO TABS: 5.0000 mg | ORAL_TABLET | Freq: Three times a day (TID) | ORAL | 0 refills | Status: AC

## 2017-12-07 NOTE — Consult Note (Signed)
   [facility] CM Inpatient Consult   12/07/2017  Kevin Watkins 1941/06/01 [id number]   Met with patient and wife prior to transitioning home.  They consent to ongoing [place] [facility].  Gave wife a brochure and new 24 hour nurse advise line with contact information.  Patient had some medication follow up needs from [place].  For questions, please contact:  Natividad Brood, RN BSN [facility] Liaison  [phone] business mobile phone Toll free office [phone]

## 2017-12-07 NOTE — Care Management Important Message (Signed)
Important Message  Patient Details  Name: KASIN TONKINSON MRN: [id number] Date of Birth: 06/30/1941   Medicare Important Message Given:  Yes    Erenest Rasher, RN 12/07/2017, 10:40 AM

## 2017-12-07 NOTE — Progress Notes (Signed)
Heart Failure Navigator Consult Note  Presentation: Per Dr. Bonner Puna: Kevin Watkins is a 77 y.o.malewith medical history significant ofCHF (preserved EF, uncertain diastolic function on 5/46); afib on Eliquis; CAD; PAD; HTN; COPD on 2L O2; and stage 3 CKD who presented withSOB. He was found to be in acute CHF exacerbation, possibly due to florinef started as an outpatient. This was stopped, replaced with midodrine for hypotension and he has diuresed ~4L with improvement in dyspnea. Cardiology was consulted and will need to follow up as outpatient.  Past Medical History:  Diagnosis Date  . CAD in native artery 06/2000   BMS to the RCA; known 60% mid dominant RCA stenosis and 50-60% OM 2 branch stenosis normal LV function. Last Myoview May 2011 negative for ischemia.  . CHF (congestive heart failure) ([place])   . COPD (chronic obstructive pulmonary disease) ([place])   . Dyslipidemia, goal LDL below 70   . EtOH dependence ([place])    Family reported  . Hypertension   . Oxygen deficiency   . PAD (peripheral artery disease) (HCC)    iliac, SCA, Innominate, and renal PTA  . Tobacco abuse     Social History   Socioeconomic History  . Marital status: Married    Spouse name: Not on file  . Number of children: Not on file  . Years of education: Not on file  . Highest education level: Not on file  Occupational History  . Not on file  Social Needs  . Financial resource strain: Not on file  . Food insecurity:    Worry: Not on file    Inability: Not on file  . Transportation needs:    Medical: Not on file    Non-medical: Not on file  Tobacco Use  . Smoking status: Former Smoker    Packs/day: 2.00    Years: 65.00    Pack years: 130.00    Types: Cigarettes    Last attempt to quit: 08/11/2016    Years since quitting: 1.3  . Smokeless tobacco: Never Used  Substance and Sexual Activity  . Alcohol use: Not Currently    Comment: 1 beer every 2 weeks; h/o heavy drinking  . Drug use: No  . Sexual  activity: Not on file  Lifestyle  . Physical activity:    Days per week: Not on file    Minutes per session: Not on file  . Stress: Not on file  Relationships  . Social connections:    Talks on phone: Not on file    Gets together: Not on file    Attends religious service: Not on file    Active member of club or organization: Not on file    Attends meetings of clubs or organizations: Not on file    Relationship status: Not on file  Other Topics Concern  . Not on file  Social History Narrative  . Not on file    ECHO:10/29/17 Study Conclusions  - Left ventricle: The cavity size was normal. Wall thickness was   increased increased in a pattern of mild to moderate LVH.   Systolic function was normal. The estimated ejection fraction was   in the range of 60% to 65%. Wall motion was normal; there were no   regional wall motion abnormalities. The study is not technically   sufficient to allow evaluation of LV diastolic function. - Aortic valve: Mildly calcified annulus. Trileaflet; normal   thickness leaflets. - Mitral valve: Mildly to moderately calcified annulus. Normal   thickness leaflets . -  Left atrium: The atrium was mildly to moderately dilated.  ------------------------------------------------------------------- Study data:  Comparison was made to the study of 05/24/2016.  Study status:  Routine.  Procedure:  The patient reported no pain pre or post test. Transthoracic echocardiography. Image quality was adequate.  Study completion:  There were no complications. Transthoracic echocardiography.  M-mode, complete 2D, spectral Doppler, and color Doppler.  Birthdate:  Patient birthdate: 1940-12-12.  Age:  Patient is 77 yr old.  Sex:  Gender: male. BMI: 29.9 kg/m^2.  Blood pressure:     97/68  Patient status: Inpatient.  Study date:  Study date: 10/29/2017. Study time: 01:45 PM.  Location:  Bedside.  BNP    Component Value Date/Time   BNP 401.3 (H) 12/04/2017 1335     ProBNP No results found for: PROBNP   Education Assessment and Provision:  Detailed education and instructions provided on heart failure disease management including the following:  Signs and symptoms of Heart Failure When to call the physician Importance of daily weights Low sodium diet Fluid restriction Medication management Anticipated future follow-up appointments  Patient education given on each of the above topics.  Patient acknowledges understanding and acceptance of all instructions.  I spoke with Mr. Kitner and his wife regarding his current hospitalization.  He tells me that he has received education in the past.  He weighs each day and takes extra Lasix of weight is up 2-3 pounds "as instructed by [name]".  I warned against doing this consistently without letting Dr Kennon Holter office know.  He also says he eats no added salt--we discussed a low sodium diet as well as high sodium foods to avoid.  He denies any issues getting or taking prescribed medications. He follows with Dr. Gwenlyn Found.  Education Materials:  "Living Better With Heart Failure" Booklet, Daily Weight Tracker Tool    High Risk Criteria for Readmission and/or Poor Patient Outcomes:   EF <30%- 60-65%  2 or more admissions in 6 months-2/63mo  Difficult social situation- No denies  Demonstrates medication noncompliance-denies  Barriers of Care:  Knowledge and compliance  Discharge Planning:   Plans to return to home with wife in [place].  He will benefit from continuing [facility] for ongoing HF education, symptom recognition and compliance recognition.

## 2017-12-07 NOTE — Progress Notes (Signed)
Discharge instructions reviewed with patient. IV and telemetry removed. Patient waiting for 3 in 1 from [place].

## 2017-12-07 NOTE — Discharge Summary (Signed)
Physician Discharge Summary  [name] XBD:[id number] DOB: Jan 27, 1941 DOA: 12/04/2017  PCP: Ronita Hipps, MD  Admit date: 12/04/2017 Discharge date: 12/07/2017  Admitted From: Home Disposition: Home   Recommendations for Outpatient Follow-up:  1. Follow up with PCP in 1-2 weeks. Check CBC and BMP.  2. Monitor orthostatic vital signs. 3. Follow up with cardiology.  Home Health: PT Equipment/Devices: Continue 2L O2 Discharge Condition: Stable CODE STATUS: Full Diet recommendation: Heart healthy  Brief/Interim Summary: Kevin Watkins a 77 y.o.malewith medical history significant ofCHF (preserved EF, uncertain diastolic function on 8/34); afib on Eliquis; CAD; PAD; HTN; COPD on 2L O2; and stage 3 CKD who presented withSOB. He was found to be in acute CHF exacerbation, possibly due to florinef started as an outpatient. This was stopped, replaced with midodrine for hypotension and he has diuresed ~4L with improvement in dyspnea. Cardiology was consulted and will need to follow up as outpatient.  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  - Transition to po lasix - Monitor I/O, daily weights. Will need cardiology follow up.   Hypotension:  - DC'ed florinef as this may have precipitated CHF exacerbation.  - Continue midodrine 5mg  TID  AFib:  - Continue to monitor rate. Metoprolol had to be stopped due to hypotension but rate has been controlled. 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  - Recheck at follow up with culture or if symptoms develop. No evidence of systemic infection.   Stage III CKD: Cr improved to baseline with diuresis - Monitor BMP at follow up  Iron deficiency anemia: Hgb at recent baseline. Reportedly had essentially negative EGD and colonoscopy, no clinical evidence of bleeding.  - Continue monitoring at follow up - Continue iron  Hyperlipidemia:  - Continue lipitor  Discharge Instructions Discharge Instructions    (HEART FAILURE PATIENTS) Call MD:  Anytime you have any of the following symptoms: 1) 3 pound weight gain in 24 hours or 5 pounds in 1 week 2) shortness of breath, with or without a dry hacking cough 3) swelling in the hands, feet or stomach 4) if you have to sleep on extra pillows at night in order to breathe.   Complete by:  As directed    Diet - low sodium heart healthy   Complete by:  As directed    Discharge instructions   Complete by:  As directed    Stop taking florinef and instead take midodrine three times daily to help with low blood pressure. Return to taking lasix once daily. Weigh yourself and report any changes to your doctor. Follow up with cardiology in the next week and with your PCP in the next 1-2 weeks. If your symptoms return/worsen (including chest pain or tightness, trouble breathing, palpitations or severe lightheadedness) seek medical attention right  away.   Increase activity slowly   Complete by:  As directed      Allergies as of 12/07/2017      Reactions   Ambien [zolpidem Tartrate] Other (See Comments)   confusion      Medication List    STOP taking these medications   fludrocortisone 0.1 MG tablet Commonly known as:  FLORINEF   metoprolol  tartrate 25 MG tablet Commonly known as:  LOPRESSOR     TAKE these medications   apixaban 5 MG Tabs tablet Commonly known as:  ELIQUIS Take 1 tablet (5 mg total) by mouth 2 (two) times daily.   aspirin EC 81 MG tablet Take 81 mg by mouth daily.   atorvastatin 40 MG tablet Commonly known as:  LIPITOR Take 40 mg by mouth daily at 6 PM.   BENEFIBER PO Take 20 mLs by mouth daily. Mix 4 teaspoon (20 ml) in water and drink daily   diphenhydrAMINE 25 MG tablet Commonly known as:  BENADRYL Take 25-50 mg by mouth See admin instructions. Take two capsules (50 mg) by mouth daily at bedtime, may also take one or two capsule (25-50 mg) during the night as needed for sleep   ferrous sulfate 325 (65 FE) MG tablet Take 1 tablet (325 mg total) by mouth 2 (two) times daily with a meal.   Fish Oil 1200 MG Caps Take 1,200 mg by mouth 2 (two) times daily.   folic acid 188 MCG tablet Commonly known as:  FOLVITE Take 800 mcg by mouth daily.   furosemide 40 MG tablet Commonly known as:  LASIX Take 1 tablet (40 mg total) by mouth daily.   Garlic 4166 MG Caps Take 1,000 mg by mouth daily.   guaiFENesin-dextromethorphan 100-10 MG/5ML syrup Commonly known as:  ROBITUSSIN DM Take 5 mLs by mouth every 4 (four) hours as needed for cough.   ipratropium-albuterol 0.5-2.5 (3) MG/3ML Soln Commonly known as:  DUONEB Take 3 mLs by nebulization every 4 (four) hours as needed (shortness of breath).   midodrine 5 MG tablet Commonly known as:  PROAMATINE Take 1 tablet (5 mg total) by mouth 3 (three) times daily with meals.   OVER THE COUNTER MEDICATION Place 2 drops under the tongue 2 (two) times daily. CBD tincture/hemp flower extract   OXYGEN Inhale 2 L into the lungs continuous.   polyethylene glycol packet Commonly known as:  MIRALAX / GLYCOLAX Take by mouth See admin instructions. Mix 3 teaspoonsful (15 mls) in water and drink every evening   PROAIR HFA 108 (90 Base) MCG/ACT  inhaler Generic drug:  albuterol Inhale 2 puffs into the lungs every 6 (six) hours as needed for wheezing or shortness of breath.   PROBIOTIC ACIDOPHILUS PO Take 1 tablet by mouth daily.   ranitidine 75 MG tablet Commonly known as:  ZANTAC Take 75 mg by mouth daily.   thiamine 100 MG tablet Take 1 tablet (100 mg total) by mouth daily.   varenicline 0.5 MG X 11 & 1 MG X 42 tablet Commonly known as:  CHANTIX PAK Take 0.5 mg by mouth 2 (two) times daily.   VITAMIN B50 COMPLEX PO Take 1 tablet by mouth daily.   vitamin E 400 UNIT capsule Generic drug:  vitamin E Take 400 Units by mouth daily.            Durable Medical Equipment  (From admission, onward)        Start     Ordered   12/07/17 0920  DME Oxygen  Once    Question Answer Comment  Mode or (Route) Nasal cannula   Liters per Minute 2   Frequency Continuous (stationary and portable oxygen unit needed)   Oxygen delivery system Gas      12/07/17 0921     Follow-up Information    Ronita Hipps, MD Follow up.   Specialty:  Family Medicine Contact information: [place]        Lorretta Harp, MD .   Specialties:  Cardiology, Radiology Contact information: [address] [place] [address] [place] [address] [phone]          Allergies  Allergen Reactions  . Ambien [Zolpidem Tartrate] Other (See Comments)    confusion    Consultations:  Cardiology  Procedures/Studies: Dg Chest 2 View  Result Date: 12/04/2017 CLINICAL DATA:  Short of breath.  Cough. EXAM: CHEST - 2 VIEW COMPARISON:  10/28/2017 FINDINGS: Mild cardiomegaly. Vascular congestion. Diffuse interstitial infiltrates. Airspace edema towards the lung bases. Small pleural effusions. No pneumothorax. IMPRESSION: CHF with pulmonary edema and small pleural effusions. Electronically Signed   By: Marybelle Killings M.D.   On: 12/04/2017 11:49    Subjective: Breathing back at baseline. No chest  pain or dizziness.   Discharge Exam: Vitals:   12/07/17 0548 12/07/17 0735  BP: (!) 97/56   Pulse: (!) 113   Resp: (!) 22   Temp: (!) 97.5 F (36.4 C)   SpO2: 99% 97%   General: Pt is alert, awake, not in acute distress Cardiovascular: Irreg, rate in 80's, S1/S2 +, no rubs, no gallops Respiratory: Nonloabored on 2L, clear Abdominal: Soft, NT, ND, bowel sounds + Extremities: No edema, no cyanosis  Labs: BNP (last 3 results) Recent Labs    12/17/16 0634 10/28/17 2230 12/04/17 1335  BNP 187.4* 427.9* 979.8*   Basic Metabolic Panel: Recent Labs  Lab 12/04/17 1108 12/05/17 0251 12/05/17 1806 12/06/17 0751 12/07/17 0449  NA 135 136  --  135 136  K 3.6 2.9* 3.7 3.6 3.4*  CL 96* 96*  --  96* 99*  CO2 25 31  --  29 31  GLUCOSE 94 115*  --  111* 105*  BUN 24* 21*  --  18 18  CREATININE 1.94* 1.74*  --  1.46* 1.57*  CALCIUM 8.7* 8.4*  --  8.8* 8.7*  MG  --   --  2.2  --   --    Liver Function Tests: No results for input(s): AST, ALT, ALKPHOS, BILITOT, PROT, ALBUMIN in the last 168 hours. No results for input(s): LIPASE, AMYLASE in the last 168 hours. No results for input(s): AMMONIA in the last 168 hours. CBC: Recent Labs  Lab 12/04/17 1108 12/05/17 0251  WBC 6.7 7.0  NEUTROABS  --  6.1  HGB 7.8* 7.5*  HCT 24.7* 23.5*  MCV 92.9 92.2  PLT 180 160   Cardiac Enzymes: No results for input(s): CKTOTAL, CKMB, CKMBINDEX, TROPONINI in the last 168 hours. BNP: Invalid input(s): POCBNP CBG: No results for input(s): GLUCAP in the last 168 hours. D-Dimer Recent Labs    12/04/17 1108  DDIMER 1.62*   Hgb A1c No results for input(s): HGBA1C in the last 72 hours. Lipid Profile No results for input(s): CHOL, HDL, LDLCALC, TRIG, CHOLHDL, LDLDIRECT in the last 72 hours. Thyroid function studies No results for input(s): TSH, T4TOTAL, T3FREE, THYROIDAB in the last 72 hours.  Invalid input(s): FREET3 Anemia work up No results for input(s): VITAMINB12, FOLATE,  FERRITIN, TIBC, IRON, RETICCTPCT in the last 72 hours. Urinalysis    Component Value Date/Time   COLORURINE YELLOW 12/04/2017 1337   APPEARANCEUR HAZY (A) 12/04/2017 1337   LABSPEC 1.006 12/04/2017 1337   PHURINE 6.0 12/04/2017 1337   GLUCOSEU NEGATIVE 12/04/2017 1337   HGBUR SMALL (A) 12/04/2017 1337   BILIRUBINUR NEGATIVE 12/04/2017 1337   KETONESUR NEGATIVE 12/04/2017 1337   PROTEINUR NEGATIVE 12/04/2017 1337   NITRITE NEGATIVE 12/04/2017 1337   LEUKOCYTESUR MODERATE (A) 12/04/2017 1337    Microbiology No results found for this or any previous visit (from the past 240 hour(s)).  Time coordinating discharge: Approximately 40 minutes  Patrecia Pour, MD  [facility] 12/07/2017, 9:21 AM Pager [phone]

## 2017-12-07 NOTE — Care Management Note (Signed)
Case Management Note  Patient Details  Name: Kevin Watkins MRN: [id number] Date of Birth: 09-14-1940  Subjective/Objective:    CHF                Action/Plan: NCM spoke to pt and wife at bedside. Pt active with [facility]. Has oxygen ([facility]) and RW at home. Requesting 3n1 bedside commode. Contacted [facility] for DME to be delivered to room prior to dc. [place] with resumption of care. Will fax facesheet, [place] orders and dc summary.    Expected Discharge Date:  12/07/17               Expected Discharge Plan:  [place]  In-House Referral:  NA  Discharge planning Services  CM Consult  Post Acute Care Choice:  Home Health, Resumption of Svcs/PTA Provider Choice offered to:  Patient, Spouse  DME Arranged:  3-N-1 DME Agency:  [place] Arranged:  RN, PT, OT [facility] Agency:  [facility]  Status of Service:  Completed, signed off  If discussed at [place] of Stay Meetings, dates discussed:    Additional Comments:  Erenest Rasher, RN 12/07/2017, 11:28 AM

## 2017-12-07 NOTE — Progress Notes (Addendum)
Progress Note  Patient Name: Kevin Watkins Date of Encounter: 12/07/2017  Primary Cardiologist: Quay Burow, MD   Subjective   Feels well.    Inpatient Medications    Scheduled Meds: . apixaban  5 mg Oral BID  . atorvastatin  40 mg Oral q1800  . famotidine  20 mg Oral Daily  . ferrous sulfate  325 mg Oral BID WC  . furosemide  40 mg Oral Daily  . ipratropium-albuterol  3 mL Nebulization TID  . midodrine  5 mg Oral TID WC  . polyethylene glycol  17 g Oral q1800  . sodium chloride flush  3 mL Intravenous Q12H  . thiamine  100 mg Oral Daily  . varenicline  0.5 mg Oral BID   Continuous Infusions: . sodium chloride     PRN Meds: sodium chloride, acetaminophen, albuterol, diphenhydrAMINE, ondansetron (ZOFRAN) IV, sodium chloride flush   Vital Signs    Vitals:   12/07/17 0024 12/07/17 0546 12/07/17 0548 12/07/17 0735  BP: (!) 86/61  (!) 97/56   Pulse: (!) 101  (!) 113   Resp: 20  (!) 22   Temp: 98.2 F (36.8 C)  (!) 97.5 F (36.4 C)   TempSrc: Oral  Oral   SpO2: 99%  99% 97%  Weight:  184 lb 9.6 oz (83.7 kg)    Height:        Intake/Output Summary (Last 24 hours) at 12/07/2017 0911 Last data filed at 12/07/2017 0546 Gross per 24 hour  Intake 960 ml  Output 1350 ml  Net -390 ml   Filed Weights   12/05/17 1321 12/06/17 0521 12/07/17 0546  Weight: 185 lb 14.4 oz (84.3 kg) 185 lb 9.6 oz (84.2 kg) 184 lb 9.6 oz (83.7 kg)    Telemetry    AFib, intermittent RVR- Personally Reviewed  ECG    No recent  Physical Exam   GEN: No acute distress.   Neck: No JVD Cardiac: irregularly irregular, no murmurs, rubs, or gallops.  Respiratory: Clear to auscultation bilaterally. GI: Soft, nontender, non-distended  MS: MIld LE edema; No deformity. Neuro:  Nonfocal  Psych: Normal affect   Labs    Chemistry Recent Labs  Lab 12/05/17 0251 12/05/17 1806 12/06/17 0751 12/07/17 0449  NA 136  --  135 136  K 2.9* 3.7 3.6 3.4*  CL 96*  --  96* 99*  CO2 31  --  29  31  GLUCOSE 115*  --  111* 105*  BUN 21*  --  18 18  CREATININE 1.74*  --  1.46* 1.57*  CALCIUM 8.4*  --  8.8* 8.7*  GFRNONAA 36*  --  44* 41*  GFRAA 41*  --  51* 47*  ANIONGAP 9  --  10 6     Hematology Recent Labs  Lab 12/04/17 1108 12/05/17 0251  WBC 6.7 7.0  RBC 2.66* 2.55*  HGB 7.8* 7.5*  HCT 24.7* 23.5*  MCV 92.9 92.2  MCH 29.3 29.4  MCHC 31.6 31.9  RDW 20.0* 19.9*  PLT 180 160    Cardiac EnzymesNo results for input(s): TROPONINI in the last 168 hours.  Recent Labs  Lab 12/04/17 1125  TROPIPOC 0.02     BNP Recent Labs  Lab 12/04/17 1335  BNP 401.3*     DDimer  Recent Labs  Lab 12/04/17 1108  DDIMER 1.62*     Radiology    No results found.  Cardiac Studies   EF 60-65% in 4/19  Patient Profile  77 y.o. male with AFib, intermittent hypotension  Assessment & Plan    1) AFib: Some elevated rates.  Rate slowing drugs limited due to BP.  I suspect his anemia is driving the increase HR as well.  Would consider transfusion to see if this would help his fatigue and his HR.  He is taking iron.  Prior studies have not shown GI bleeding.  Can discuss with medicine.  I would prefer a transfusion trial rather than adding digoxin at this point, given renal insuffiency.  He also tries to be active at home and digoxin may not be effective.  Eliquis for stroke prevention.  2) No sx of hypotension.  He states he feels better when BP is in the 110s.    3) Diastolic CHF, acute: appears euvolemic at this time. Back on oral Lasix.  CKD 3--- Cr. slightly increased today.  For questions or updates, please contact [place] Please consult [email or web address] for contact info under Cardiology/STEMI.      Signed, Larae Grooms, MD  12/07/2017, 9:11 AM

## 2017-12-08 ENCOUNTER — Ambulatory Visit: Payer: Self-pay | Admitting: Pharmacist

## 2017-12-08 ENCOUNTER — Other Ambulatory Visit: Payer: Self-pay | Admitting: Pharmacist

## 2017-12-08 ENCOUNTER — Other Ambulatory Visit: Payer: Self-pay

## 2017-12-08 NOTE — Patient Outreach (Signed)
Transition of care: Discharged on 12/07/2017 Heart failure and hypotension:  Placed call to patient who is asleep per wife. Wife states that patient is resting . Reports weight is the same today as yesterday. Reports the same amount of swelling. Reports has all medications and is taking as prescribed. Reports home health nurse saw patient today and BP was 37-543 systolic.   Wife denies any concerns today.   PLAN: will contact patient in 1 week and schedule a home visit. Encouraged wife to call MD for any changes in symptoms.   Tomasa Rand, RN, BSN, CEN [facility] [employer] [phone]

## 2017-12-08 NOTE — Patient Outreach (Signed)
[facility]) Care Management  12/08/2017  Kevin Watkins [birth year] [id number]   Patient was called regarding post discharge medication review. Unfortunately, he did not answer the phone. HIPAA compliant message was left on his voicemail.  Plan: Call patient back within 3-4 business days. Send patient an unsuccessful contact letter.  Elayne Guerin, PharmD, [place] Clinical Pharmacist [phone]

## 2017-12-12 ENCOUNTER — Other Ambulatory Visit: Payer: Self-pay

## 2017-12-12 ENCOUNTER — Other Ambulatory Visit: Payer: Self-pay | Admitting: Pharmacist

## 2017-12-12 DIAGNOSIS — Z6829 Body mass index (BMI) 29.0-29.9, adult: Secondary | ICD-10-CM | POA: Diagnosis not present

## 2017-12-12 DIAGNOSIS — I509 Heart failure, unspecified: Secondary | ICD-10-CM | POA: Diagnosis not present

## 2017-12-12 DIAGNOSIS — R630 Anorexia: Secondary | ICD-10-CM | POA: Diagnosis not present

## 2017-12-12 NOTE — Patient Outreach (Signed)
Transition of care: Placed call to patient for follow up transition of care. Wife reports patient just returned from MD visit with Dr. Helene Kelp. Reports weight of 183.5. Reports decreased swelling except for feet. Wife reports that patient is wearing compression hose.  Wife states that patient continues to self monitor BP daily and at MD office BP readings in the 70's.  Wife states not MD follow up with Dr. Gwenlyn Found but she will call today and reports BP and make an appointment.   PLAN: will continue weekly transition of care calls. Encouraged reports low BP to Md office and making a follow up appointment.  Tomasa Rand, RN, BSN, CEN [facility] [employer] [phone]

## 2017-12-12 NOTE — Patient Outreach (Signed)
[facility]) Care Management  12/12/2017  Kevin Watkins 12/21/1940 [id number]   Patient was called to complete a post discharge medication review and to follow up on medication assistance. HIPAA identifiers were obtained. Patient said his wife was not home and would be the best person to review his meds and verify any necessary documentation for medication assistance.  Patient's wife was not home at the time of my call.  Plan: Call patient back within 1-2 business days.    Elayne Guerin, PharmD, [place] Clinical Pharmacist [phone]

## 2017-12-14 ENCOUNTER — Other Ambulatory Visit: Payer: Self-pay | Admitting: Pharmacist

## 2017-12-14 NOTE — Patient Outreach (Signed)
[place] [facility]) Care Management  [place]   12/14/2017  Kevin Watkins 1940-10-26 [id number]  Subjective: Patient was called regarding medication review post discharge and to follow up on medication assistance. HIPAA identifiers were obtained from patient's wife who is on his HIPAA.  Patient is a 77 year old male with multiple medical conditions including but not limited to:  CAD, dyslipidemia, essential hypertension, CHF, A Fib, CKD stage III,iron deficiency anemia, and very recently stopped smoking.  He was recently hospitalized for CHF exacerbation.   Objective:   Encounter Medications: Outpatient Encounter Medications as of 12/14/2017  Medication Sig Note  . apixaban (ELIQUIS) 5 MG TABS tablet Take 1 tablet (5 mg total) by mouth 2 (two) times daily.   [name] aspirin EC 81 MG tablet Take 81 mg by mouth daily.   [name] atorvastatin (LIPITOR) 40 MG tablet Take 40 mg by mouth daily at 6 PM.    . B Complex-Biotin-FA (VITAMIN B50 COMPLEX PO) Take 1 tablet by mouth daily.   . diphenhydrAMINE (BENADRYL) 25 MG tablet Take 25-50 mg by mouth See admin instructions. Take two capsules (50 mg) by mouth daily at bedtime, may also take one or two capsule (25-50 mg) during the night as needed for sleep   . ferrous sulfate 325 (65 FE) MG tablet Take 1 tablet (325 mg total) by mouth 2 (two) times daily with a meal.   . folic acid (FOLVITE) 761 MCG tablet Take 800 mcg by mouth daily.   . furosemide (LASIX) 40 MG tablet Take 1 tablet (40 mg total) by mouth daily.   . Garlic 9509 MG CAPS Take 1,000 mg by mouth daily.    [name] guaiFENesin-dextromethorphan (ROBITUSSIN DM) 100-10 MG/5ML syrup Take 5 mLs by mouth every 4 (four) hours as needed for cough. 12/14/2017: PRN only  . ipratropium-albuterol (DUONEB) 0.5-2.5 (3) MG/3ML SOLN Take 3 mLs by nebulization every 4 (four) hours as needed (shortness of breath).    . Lactobacillus (PROBIOTIC ACIDOPHILUS PO) Take 1 tablet by mouth daily.   . midodrine  (PROAMATINE) 5 MG tablet Take 1 tablet (5 mg total) by mouth 3 (three) times daily with meals.   . Omega-3 Fatty Acids (FISH OIL) 1200 MG CAPS Take 1,200 mg by mouth 2 (two) times daily.   [name] OVER THE COUNTER MEDICATION Place 2 drops under the tongue 2 (two) times daily. CBD tincture/hemp flower extract   . OXYGEN Inhale 2 L into the lungs continuous.   . polyethylene glycol (MIRALAX / GLYCOLAX) packet Take by mouth See admin instructions. Mix 3 teaspoonsful (15 mls) in water and drink every evening   . PROAIR HFA 108 (90 Base) MCG/ACT inhaler Inhale 2 puffs into the lungs every 6 (six) hours as needed for wheezing or shortness of breath. 11/14/2017: Takes 3-4 times per day  . ranitidine (ZANTAC) 75 MG tablet Take 75 mg by mouth daily.   . varenicline (CHANTIX PAK) 0.5 MG X 11 & 1 MG X 42 tablet Take 0.5 mg by mouth 2 (two) times daily.    . Wheat Dextrin (BENEFIBER PO) Take 20 mLs by mouth daily. Mix 4 teaspoon (20 ml) in water and drink daily   . [DISCONTINUED] fludrocortisone (FLORINEF) 0.1 MG tablet Take 1 tablet by mouth daily.   [name] thiamine 100 MG tablet Take 1 tablet (100 mg total) by mouth daily.   . vitamin E (VITAMIN E) 400 UNIT capsule Take 400 Units by mouth daily.    No facility-administered encounter medications  on file as of 12/14/2017.     Functional Status: In your present state of health, do you have any difficulty performing the following activities: 12/05/2017 11/14/2017  Hearing? Y Y  Comment - wearing hearing aids.  Vision? Y Y  Comment - wears glasses  Difficulty concentrating or making decisions? N N  Walking or climbing stairs? Y Y  Comment - dizziness  Dressing or bathing? N N  Doing errands, shopping? Y Y  Comment pt doe snot drive currently not driving  Conservation officer, nature and eating ? - Y  Using the Toilet? - N  In the past six months, have you accidently leaked urine? - N  Do you have problems with loss of bowel control? - N  Managing your Medications? - Y  Comment -  expensive  Managing your Finances? - N  Housekeeping or managing your Housekeeping? - N  Some recent data might be hidden    Fall/Depression Screening: Fall Risk  11/14/2017  Falls in the past year? Yes  Number falls in past yr: 2 or more  Injury with Fall? Yes  Comment bruisies  Risk Factor Category  High Fall Risk  Risk for fall due to : History of fall(s);Medication side effect  Follow up Falls evaluation completed;Falls prevention discussed   PHQ 2/9 Scores 11/14/2017 01/04/2017  PHQ - 2 Score 1 0      Assessment:  ASSESSMENT: Date Discharged from Hospital: 12/07/2017 Date Medication Reconciliation Performed: 12/14/2017  Medications Discontinued at Discharge: (Delete if not applicable)  Fludrocortisone 0.1mg   Metoprolol  25mg   New Medications at Discharge: (delete if applicable)  Midodrine 5mg  None 1 tablet three times daily  No new medications were prescribed at discharge.  Patient was recently discharged from hospital and all medications have been reviewed   Drugs sorted by system:  Neurologic/Psychologic: Diphenhydramine (for sleep)  Cardiovascular: Eliquis Aspirin Atorvastatin Fish Oil Midodrine Furosemide  Pulmonary/Allergy: Guaifenesin-Dextromethorphan Ipratropium-albuterol Oxygen ProAir Chantix  Gastrointestinal: Benefiber Polyethylene Glycol Probiotic Ranitidine  Vitamins/Minerals: Ferrous sulfate Folic Acid Garlic Thiamine (did not remember if he is taking this or not) Vitamin B 50 Complex Vitamin E (did not remember if he is taking this or not)    Infectious Diseases: CBD Drops  Medication Review Findings:  Adherence: Patient was still taking Fludrocortisone despite it being discontinued at discharge. It was documented in the discharge that the patient's CHF exacerbation was secondary to fludrocortisone. Patient's wife was instructed to remove the fludrocortisone medication bottle from the patient's active  medications.  Medications to avoid in the elderly: diphenhydramine--patient takes diphenhydramine at bedtime but with his age an comorbidities he is susceptible to CNS depression and increased fall risk.   Medication Assistance Findings: From previous medication assistance review, patient MAY qualify to receive Eliquis from [place] patient assistance program.   Our office received the patient's signed application but he did not provide the necessary financial documentation.   PLAN: -Instructed patient to take new medications as prescribed and discontinue old medications as prescribed  -will route note to [place], Etter Sjogren to send a letter to the patient requesting financial documents to accompany his application, -Call patient back in 5-7 business days to be sure he received the applications and to be sure he is not taking Fludrocortisone.   Elayne Guerin, PharmD, [place] Clinical Pharmacist [phone]

## 2017-12-15 ENCOUNTER — Inpatient Hospital Stay
Admission: AD | Admit: 2017-12-15 | Payer: Self-pay | Source: Other Acute Inpatient Hospital | Admitting: Family Medicine

## 2017-12-15 DIAGNOSIS — F419 Anxiety disorder, unspecified: Secondary | ICD-10-CM | POA: Diagnosis not present

## 2017-12-15 DIAGNOSIS — Z7902 Long term (current) use of antithrombotics/antiplatelets: Secondary | ICD-10-CM | POA: Diagnosis not present

## 2017-12-15 DIAGNOSIS — Z87891 Personal history of nicotine dependence: Secondary | ICD-10-CM | POA: Diagnosis not present

## 2017-12-15 DIAGNOSIS — I4891 Unspecified atrial fibrillation: Secondary | ICD-10-CM | POA: Diagnosis not present

## 2017-12-15 DIAGNOSIS — J80 Acute respiratory distress syndrome: Secondary | ICD-10-CM | POA: Diagnosis not present

## 2017-12-15 DIAGNOSIS — J440 Chronic obstructive pulmonary disease with acute lower respiratory infection: Secondary | ICD-10-CM | POA: Diagnosis not present

## 2017-12-15 DIAGNOSIS — A419 Sepsis, unspecified organism: Secondary | ICD-10-CM | POA: Diagnosis not present

## 2017-12-15 DIAGNOSIS — Z9981 Dependence on supplemental oxygen: Secondary | ICD-10-CM | POA: Diagnosis not present

## 2017-12-15 DIAGNOSIS — J9601 Acute respiratory failure with hypoxia: Secondary | ICD-10-CM | POA: Diagnosis not present

## 2017-12-15 DIAGNOSIS — D649 Anemia, unspecified: Secondary | ICD-10-CM | POA: Diagnosis not present

## 2017-12-15 DIAGNOSIS — J189 Pneumonia, unspecified organism: Secondary | ICD-10-CM | POA: Diagnosis not present

## 2017-12-15 DIAGNOSIS — I110 Hypertensive heart disease with heart failure: Secondary | ICD-10-CM | POA: Diagnosis not present

## 2017-12-15 DIAGNOSIS — I5030 Unspecified diastolic (congestive) heart failure: Secondary | ICD-10-CM | POA: Diagnosis not present

## 2017-12-15 DIAGNOSIS — J449 Chronic obstructive pulmonary disease, unspecified: Secondary | ICD-10-CM | POA: Diagnosis not present

## 2017-12-15 DIAGNOSIS — M1990 Unspecified osteoarthritis, unspecified site: Secondary | ICD-10-CM | POA: Diagnosis not present

## 2017-12-15 DIAGNOSIS — I2510 Atherosclerotic heart disease of native coronary artery without angina pectoris: Secondary | ICD-10-CM | POA: Diagnosis not present

## 2017-12-15 DIAGNOSIS — R7881 Bacteremia: Secondary | ICD-10-CM | POA: Diagnosis not present

## 2017-12-15 DIAGNOSIS — Z79899 Other long term (current) drug therapy: Secondary | ICD-10-CM | POA: Diagnosis not present

## 2017-12-15 DIAGNOSIS — R0602 Shortness of breath: Secondary | ICD-10-CM | POA: Diagnosis not present

## 2017-12-15 DIAGNOSIS — I509 Heart failure, unspecified: Secondary | ICD-10-CM | POA: Diagnosis not present

## 2017-12-15 DIAGNOSIS — J90 Pleural effusion, not elsewhere classified: Secondary | ICD-10-CM | POA: Diagnosis not present

## 2017-12-15 DIAGNOSIS — I252 Old myocardial infarction: Secondary | ICD-10-CM | POA: Diagnosis not present

## 2017-12-15 DIAGNOSIS — I130 Hypertensive heart and chronic kidney disease with heart failure and stage 1 through stage 4 chronic kidney disease, or unspecified chronic kidney disease: Secondary | ICD-10-CM | POA: Diagnosis not present

## 2017-12-15 DIAGNOSIS — Z4682 Encounter for fitting and adjustment of non-vascular catheter: Secondary | ICD-10-CM | POA: Diagnosis not present

## 2017-12-15 DIAGNOSIS — J441 Chronic obstructive pulmonary disease with (acute) exacerbation: Secondary | ICD-10-CM | POA: Diagnosis not present

## 2017-12-15 DIAGNOSIS — N183 Chronic kidney disease, stage 3 (moderate): Secondary | ICD-10-CM | POA: Diagnosis not present

## 2017-12-16 DIAGNOSIS — J189 Pneumonia, unspecified organism: Secondary | ICD-10-CM

## 2017-12-16 DIAGNOSIS — R7881 Bacteremia: Secondary | ICD-10-CM

## 2017-12-17 DIAGNOSIS — J9621 Acute and chronic respiratory failure with hypoxia: Secondary | ICD-10-CM | POA: Diagnosis not present

## 2017-12-17 DIAGNOSIS — I2510 Atherosclerotic heart disease of native coronary artery without angina pectoris: Secondary | ICD-10-CM | POA: Diagnosis not present

## 2017-12-17 DIAGNOSIS — J9622 Acute and chronic respiratory failure with hypercapnia: Secondary | ICD-10-CM | POA: Diagnosis not present

## 2017-12-17 DIAGNOSIS — I739 Peripheral vascular disease, unspecified: Secondary | ICD-10-CM | POA: Diagnosis not present

## 2017-12-17 DIAGNOSIS — T45515A Adverse effect of anticoagulants, initial encounter: Secondary | ICD-10-CM | POA: Diagnosis not present

## 2017-12-17 DIAGNOSIS — I509 Heart failure, unspecified: Secondary | ICD-10-CM | POA: Diagnosis not present

## 2017-12-17 DIAGNOSIS — Z452 Encounter for adjustment and management of vascular access device: Secondary | ICD-10-CM | POA: Diagnosis not present

## 2017-12-17 DIAGNOSIS — J181 Lobar pneumonia, unspecified organism: Secondary | ICD-10-CM | POA: Diagnosis not present

## 2017-12-17 DIAGNOSIS — R791 Abnormal coagulation profile: Secondary | ICD-10-CM | POA: Diagnosis not present

## 2017-12-17 DIAGNOSIS — J90 Pleural effusion, not elsewhere classified: Secondary | ICD-10-CM | POA: Diagnosis not present

## 2017-12-17 DIAGNOSIS — Z66 Do not resuscitate: Secondary | ICD-10-CM | POA: Diagnosis not present

## 2017-12-17 DIAGNOSIS — I708 Atherosclerosis of other arteries: Secondary | ICD-10-CM | POA: Diagnosis not present

## 2017-12-17 DIAGNOSIS — J9600 Acute respiratory failure, unspecified whether with hypoxia or hypercapnia: Secondary | ICD-10-CM | POA: Diagnosis not present

## 2017-12-17 DIAGNOSIS — I701 Atherosclerosis of renal artery: Secondary | ICD-10-CM | POA: Diagnosis not present

## 2017-12-17 DIAGNOSIS — I482 Chronic atrial fibrillation: Secondary | ICD-10-CM | POA: Diagnosis not present

## 2017-12-17 DIAGNOSIS — R6521 Severe sepsis with septic shock: Secondary | ICD-10-CM | POA: Diagnosis not present

## 2017-12-17 DIAGNOSIS — J189 Pneumonia, unspecified organism: Secondary | ICD-10-CM | POA: Diagnosis not present

## 2017-12-17 DIAGNOSIS — J690 Pneumonitis due to inhalation of food and vomit: Secondary | ICD-10-CM | POA: Diagnosis not present

## 2017-12-17 DIAGNOSIS — J80 Acute respiratory distress syndrome: Secondary | ICD-10-CM | POA: Diagnosis not present

## 2017-12-17 DIAGNOSIS — Z4682 Encounter for fitting and adjustment of non-vascular catheter: Secondary | ICD-10-CM | POA: Diagnosis not present

## 2017-12-17 DIAGNOSIS — J449 Chronic obstructive pulmonary disease, unspecified: Secondary | ICD-10-CM | POA: Diagnosis not present

## 2017-12-17 DIAGNOSIS — T17990A Other foreign object in respiratory tract, part unspecified in causing asphyxiation, initial encounter: Secondary | ICD-10-CM | POA: Diagnosis not present

## 2017-12-17 DIAGNOSIS — J942 Hemothorax: Secondary | ICD-10-CM | POA: Diagnosis not present

## 2017-12-17 DIAGNOSIS — I5033 Acute on chronic diastolic (congestive) heart failure: Secondary | ICD-10-CM | POA: Diagnosis not present

## 2017-12-17 DIAGNOSIS — R0603 Acute respiratory distress: Secondary | ICD-10-CM | POA: Diagnosis not present

## 2017-12-17 DIAGNOSIS — N183 Chronic kidney disease, stage 3 (moderate): Secondary | ICD-10-CM | POA: Diagnosis not present

## 2017-12-17 DIAGNOSIS — I9589 Other hypotension: Secondary | ICD-10-CM | POA: Diagnosis not present

## 2017-12-17 DIAGNOSIS — D649 Anemia, unspecified: Secondary | ICD-10-CM | POA: Diagnosis not present

## 2017-12-17 DIAGNOSIS — A419 Sepsis, unspecified organism: Secondary | ICD-10-CM | POA: Diagnosis not present

## 2017-12-17 DIAGNOSIS — J9811 Atelectasis: Secondary | ICD-10-CM | POA: Diagnosis not present

## 2017-12-17 DIAGNOSIS — J9601 Acute respiratory failure with hypoxia: Secondary | ICD-10-CM | POA: Diagnosis not present

## 2017-12-17 DIAGNOSIS — R7881 Bacteremia: Secondary | ICD-10-CM | POA: Diagnosis not present

## 2017-12-17 DIAGNOSIS — E874 Mixed disorder of acid-base balance: Secondary | ICD-10-CM | POA: Diagnosis not present

## 2017-12-17 DIAGNOSIS — I481 Persistent atrial fibrillation: Secondary | ICD-10-CM | POA: Diagnosis not present

## 2017-12-18 ENCOUNTER — Telehealth: Payer: Self-pay | Admitting: Emergency Medicine

## 2017-12-18 ENCOUNTER — Other Ambulatory Visit: Payer: Self-pay | Admitting: Pharmacy Technician

## 2017-12-18 NOTE — Patient Outreach (Signed)
[facility]) Care Management  01/03/2018  Kevin Watkins 01/01/1941 [id number]   Spoke to Jonelle Sidle at Dr. Sudie Bailey office to follow up on [employer] patient application that was mailed to office May 14th for patients Proventil. Jonelle Sidle stated that they had not received the application. Jonelle Sidle printed off page 2 and stated she would mail it back to me after Dr. Sudie Bailey filled and signed the application.  Will mail completed application into [employer] once received.  Maud Deed [place], [place] Management [phone]

## 2017-12-18 NOTE — Telephone Encounter (Signed)
Spoke with Caryl Pina and she stated she mailed the [employer] application to [place] on 7/61/9509 and she has not received a signed application for pt's Proventil.   I printed off a [employer] application form online and just need RB to sign. An envelope is attached so we can mail it back to [place] after signature. Will route to RB and place in to sign folder.    [place] Management [place]

## 2017-12-18 NOTE — Telephone Encounter (Signed)
I called Caryl Pina to discuss paperwork and the time she faxed it to our office but she didn't answer. I left a detailed message to call back.   I check RB folder and both fax machines but did not see any patient assistance forms. Will await a return call from [place].

## 2017-12-18 NOTE — Patient Outreach (Signed)
[facility]) Care Management  12/24/2017  Kevin Watkins [birth year] [id number]   Contacted Dr. Sudie Bailey office to follow up on [employer] patient assistance application that was mailed out to them.  Left message with Andee Poles to have a nurse call me back.  Maud Deed [place], [place] Management [phone]

## 2017-12-18 NOTE — Telephone Encounter (Signed)
Signed the paperwork

## 2017-12-18 NOTE — Telephone Encounter (Signed)
[employer] [phone]

## 2017-12-19 ENCOUNTER — Other Ambulatory Visit: Payer: Self-pay

## 2017-12-19 NOTE — Telephone Encounter (Signed)
Forms have been mailed to [place] with [facility]. Nothing further was needed.

## 2017-12-19 NOTE — Patient Outreach (Signed)
Transition of care: Placed call to patient. No answer. Left a message requesting a call back.  PLAN: will mail outreach letter and re attempt in 3 days.   Tomasa Rand, RN, BSN, CEN [facility] [employer] [phone]

## 2017-12-21 ENCOUNTER — Other Ambulatory Visit: Payer: Self-pay | Admitting: Pharmacy Technician

## 2017-12-21 ENCOUNTER — Telehealth: Payer: Self-pay | Admitting: Cardiovascular Disease

## 2017-12-21 NOTE — Patient Outreach (Signed)
[facility]) Care Management  12/21/2017  CURT OATIS [birth year] [id number]   Received provider portion of [employer] patient assistance in the mail. Prepared completed application and required documents to be mailed out to company.  Will check status of application in the next 10-14 business days.  Maud Deed [place], [place] Management [phone]

## 2017-12-21 NOTE — Telephone Encounter (Signed)
New Message:    Wife called and wanted you to know pt passed away on 15-Jan-2018.

## 2017-12-21 NOTE — Telephone Encounter (Signed)
Returned the call to the patient's wife. She stated that the patient passed away on 12-19-2017 and would like for Dr. Gwenlyn Found to know. The wife has been informed to call us if there is anything we can do for her. Message has been routed.

## 2017-12-22 ENCOUNTER — Other Ambulatory Visit: Payer: Self-pay

## 2017-12-22 ENCOUNTER — Other Ambulatory Visit: Payer: Self-pay | Admitting: Pharmacist

## 2017-12-22 NOTE — Patient Outreach (Signed)
[facility] [facility]) Care Management  12/22/2017  ALEXZAVIER GIRARDIN [birth year] [id number]   Patient was called regarding medication assistance. I spoke with his wife. HIPAA identifiers were obtained. Just after reminding Mrs. Lesperance who I was, she informed me the patient passed at [facility] earlier this week.   Plan: Close patient's pharmacy case. I called patient's [facility] Community Nurse Tomasa Rand and left a message. I will route her a note as well for completion of case closure.   Elayne Guerin, PharmD, [place] Clinical Pharmacist [phone]

## 2017-12-22 NOTE — Patient Outreach (Signed)
Case closure:  Message received from [place] that patient passed away on 28-Dec-2017 at [place]: will close case and send MD update. According to Epic Dr. Gwenlyn Found notified today by wife.   Tomasa Rand, RN, BSN, CEN [employer] [facility] [employer] [phone]

## 2017-12-26 ENCOUNTER — Ambulatory Visit: Payer: PPO | Admitting: Cardiovascular Disease

## 2017-12-26 ENCOUNTER — Ambulatory Visit: Payer: PPO

## 2018-01-08 DEATH — deceased

## 2018-01-23 NOTE — Telephone Encounter (Signed)
This encounter was created in error - please disregard.

## 2018-02-15 ENCOUNTER — Ambulatory Visit: Payer: PPO | Admitting: Cardiology

## 2018-03-15 ENCOUNTER — Telehealth: Payer: Self-pay | Admitting: Emergency Medicine

## 2018-03-15 NOTE — Telephone Encounter (Signed)
Per Tammy RN, patient expired on 6.10.19 as reported to them by pt's spouse Lindell Noe would like to speak with someone regarding patient's Proventil HFA (regulatory commission obligation) Call back # is [phone] Case # [id number]

## 2018-03-15 NOTE — Telephone Encounter (Signed)
Spoke with Tammy and answered her questions to the best of my ability about Proventil. I advised her of the date of when this medication was prescribed. Nothing further is needed.

## 2018-07-08 IMAGING — CR DG CHEST 2V
2 series · 2 of 2 positions shown · non-contrast
Comparison: 10/25/2016

CLINICAL DATA: Cough and SOB x2 months. Hx of COPD, CAD, CHF, HTN.
Smoker.

EXAM:
CHEST - 2 VIEW

[chest lat]
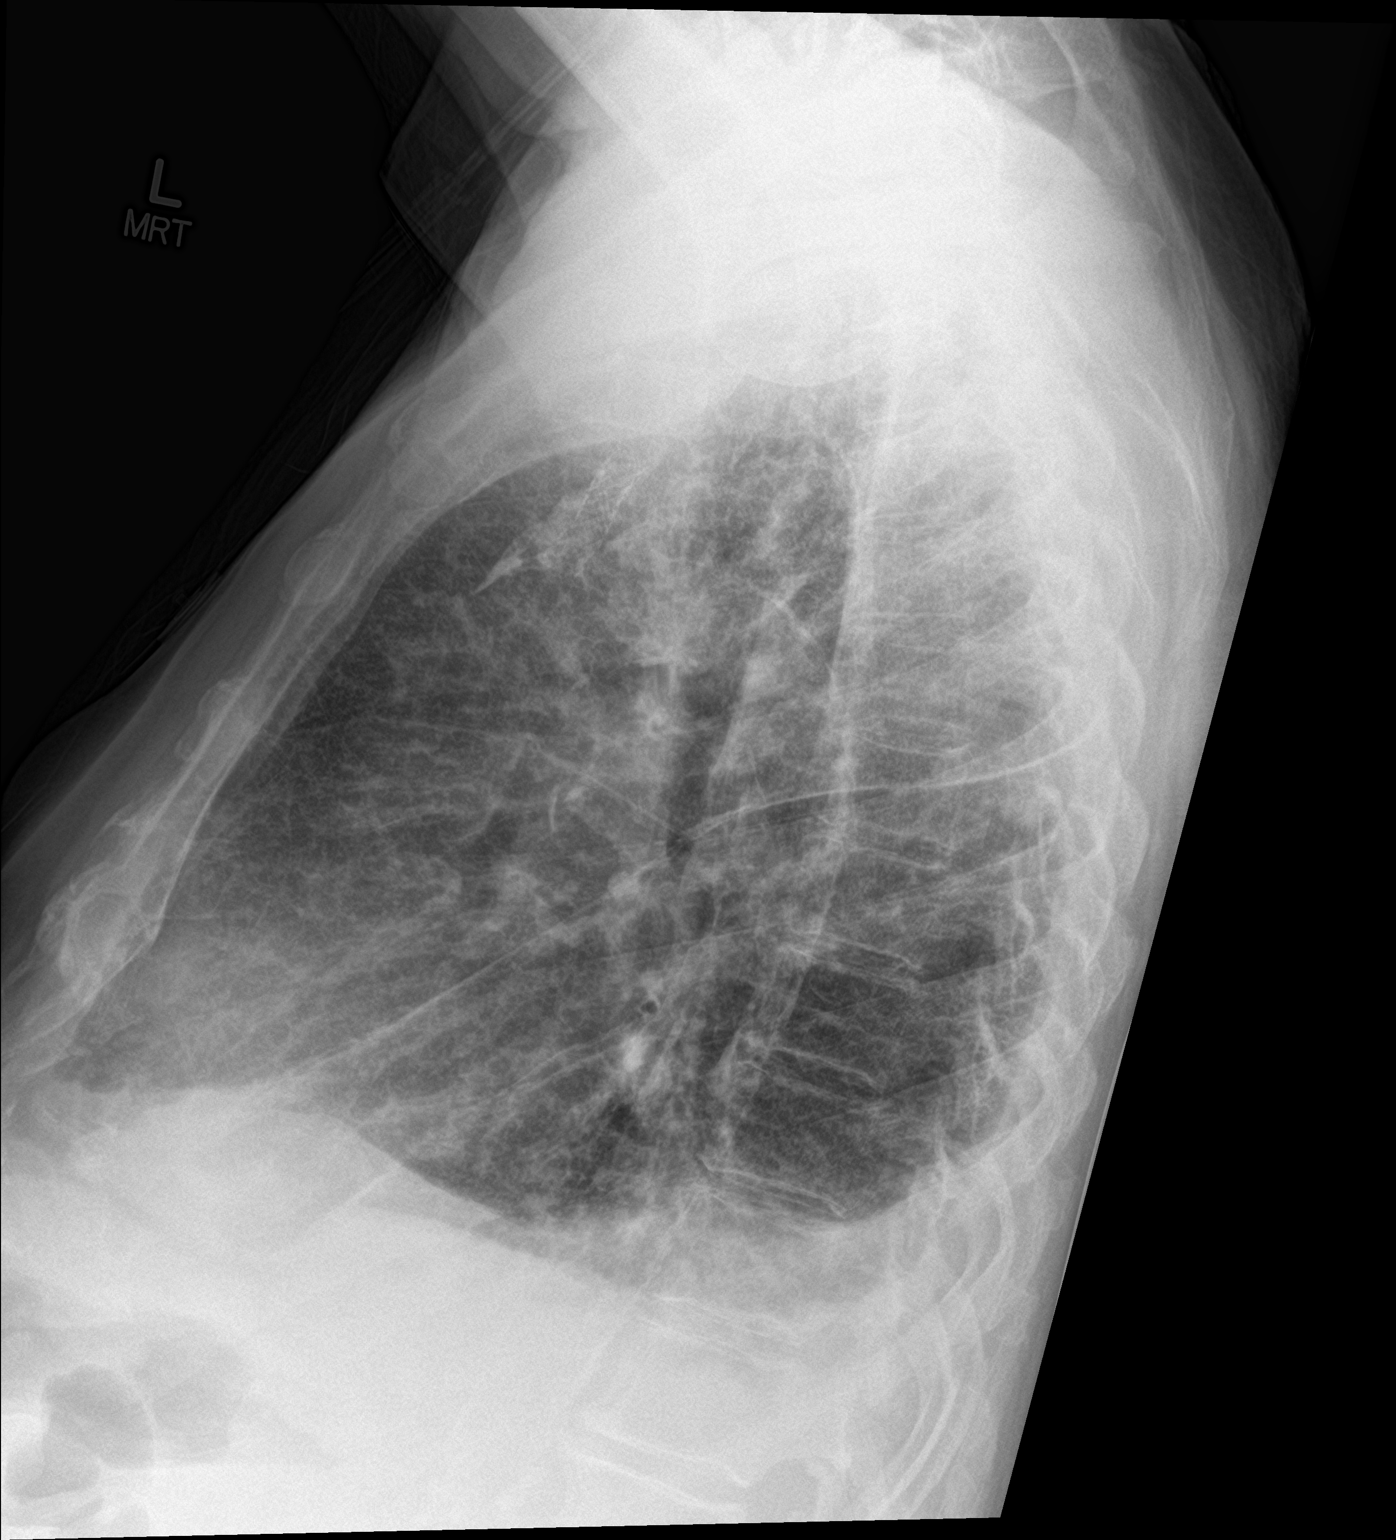

[chest ap]
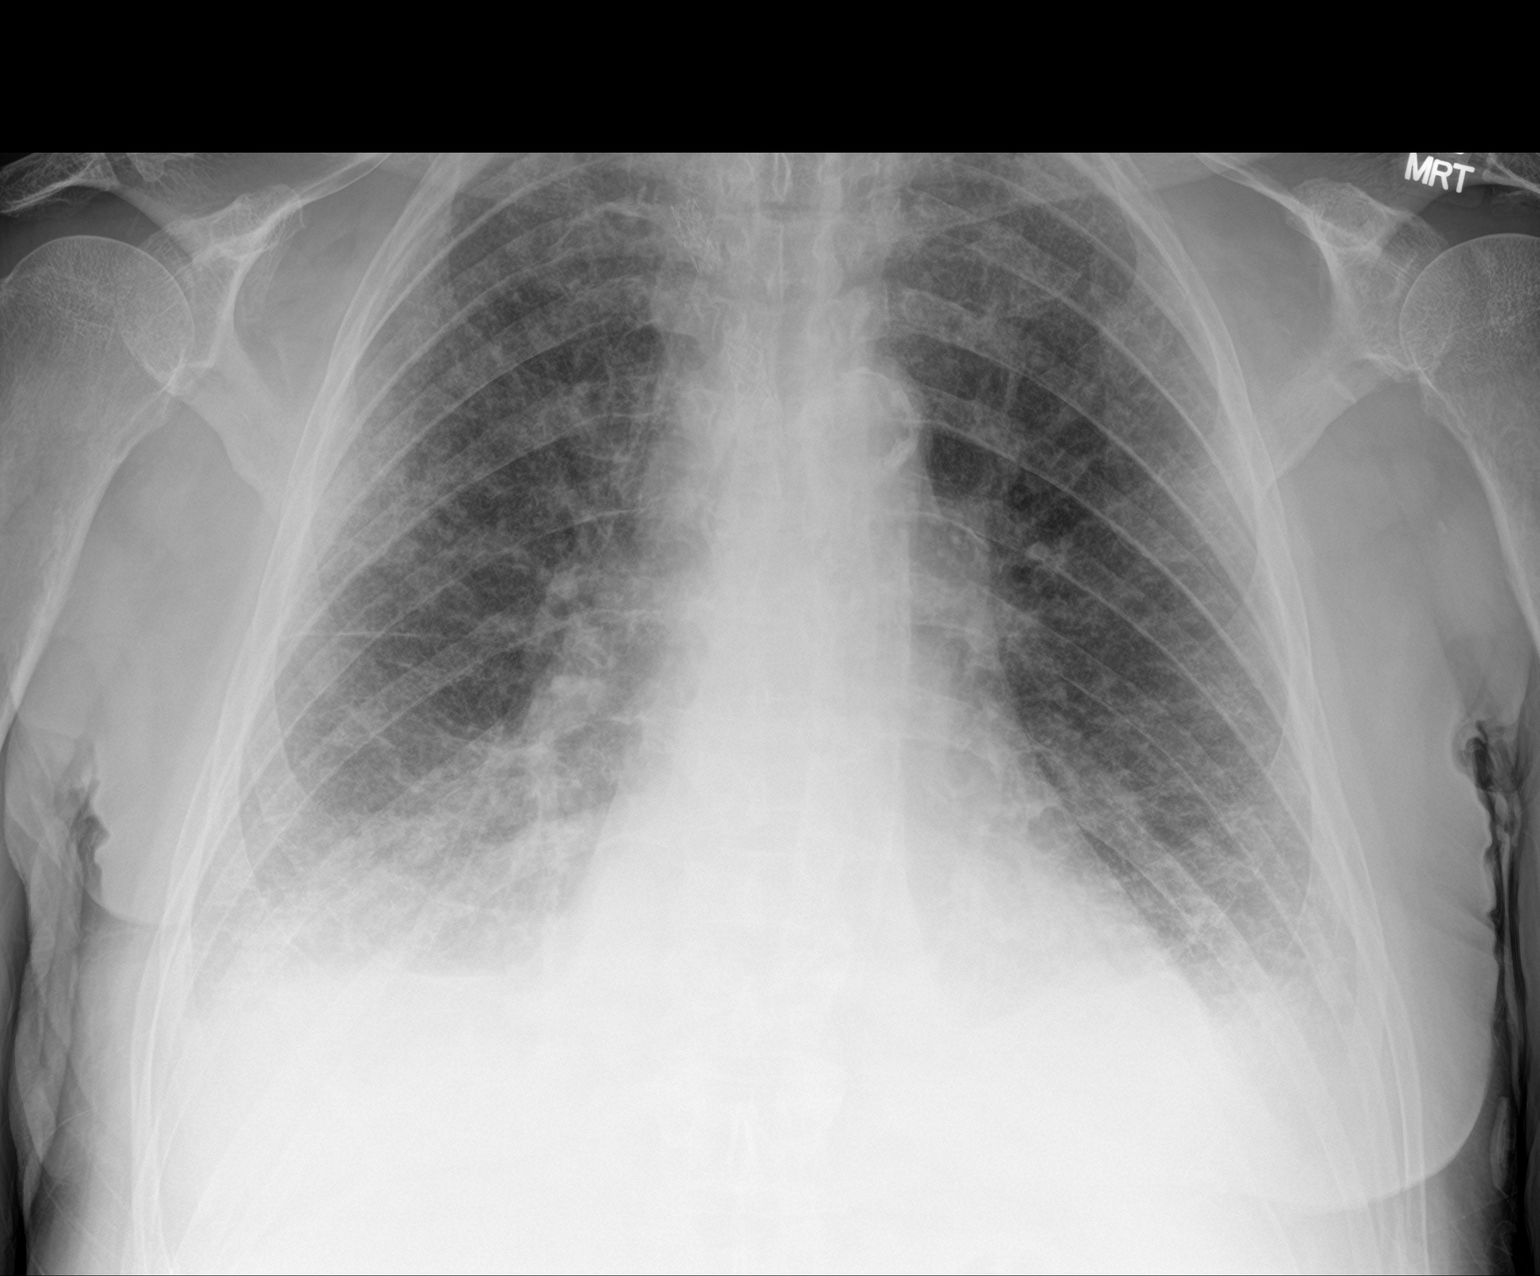

[2 of 2 positions shown; findings below may reference images not displayed]

FINDINGS: Heart size is accentuated by technique. There is diffuse prominence
of interstitial markings with a basilar predominance. Small
bilateral pleural effusions are again noted, possibly slightly
increased. There is minimal bibasilar atelectasis.
IMPRESSION: 1. Chronic interstitial lung disease.
2. Increasing bilateral pleural effusions and minimal bibasilar
atelectasis.

## 2019-08-06 NOTE — Telephone Encounter (Signed)
Error
